# Patient Record
Sex: Male | Born: 1950 | Race: Black or African American | Hispanic: No | Marital: Married | Smoking: Former smoker
Health system: Southern US, Community
[De-identification: ages and names within clinical notes are randomized; demographics above are authoritative.]

## PROBLEM LIST (undated history)

## (undated) DIAGNOSIS — I499 Cardiac arrhythmia, unspecified: Secondary | ICD-10-CM

## (undated) DIAGNOSIS — I739 Peripheral vascular disease, unspecified: Secondary | ICD-10-CM

## (undated) DIAGNOSIS — E1159 Type 2 diabetes mellitus with other circulatory complications: Secondary | ICD-10-CM

## (undated) DIAGNOSIS — E1169 Type 2 diabetes mellitus with other specified complication: Secondary | ICD-10-CM

## (undated) DIAGNOSIS — E119 Type 2 diabetes mellitus without complications: Secondary | ICD-10-CM

## (undated) DIAGNOSIS — D631 Anemia in chronic kidney disease: Secondary | ICD-10-CM

## (undated) DIAGNOSIS — N183 Chronic kidney disease, stage 3 unspecified: Secondary | ICD-10-CM

## (undated) DIAGNOSIS — Z794 Long term (current) use of insulin: Secondary | ICD-10-CM

## (undated) DIAGNOSIS — I509 Heart failure, unspecified: Secondary | ICD-10-CM

## (undated) DIAGNOSIS — E785 Hyperlipidemia, unspecified: Secondary | ICD-10-CM

## (undated) DIAGNOSIS — N189 Chronic kidney disease, unspecified: Secondary | ICD-10-CM

## (undated) DIAGNOSIS — I152 Hypertension secondary to endocrine disorders: Secondary | ICD-10-CM

## (undated) HISTORY — PX: PERIPHERAL ARTERIAL STENT GRAFT: SHX2220

## (undated) HISTORY — DX: Heart failure, unspecified: I50.9

## (undated) HISTORY — DX: Peripheral vascular disease, unspecified: I73.9

## (undated) HISTORY — DX: Cardiac arrhythmia, unspecified: I49.9

## (undated) HISTORY — DX: Hyperlipidemia, unspecified: E78.5

---

## 2010-12-20 DIAGNOSIS — E1165 Type 2 diabetes mellitus with hyperglycemia: Secondary | ICD-10-CM | POA: Insufficient documentation

## 2010-12-20 DIAGNOSIS — I10 Essential (primary) hypertension: Secondary | ICD-10-CM | POA: Diagnosis present

## 2010-12-20 DIAGNOSIS — IMO0002 Reserved for concepts with insufficient information to code with codable children: Secondary | ICD-10-CM | POA: Insufficient documentation

## 2011-05-02 HISTORY — PX: TRANSMETATARSAL AMPUTATION: SHX6197

## 2011-08-03 DIAGNOSIS — I7025 Atherosclerosis of native arteries of other extremities with ulceration: Secondary | ICD-10-CM | POA: Insufficient documentation

## 2012-09-18 DIAGNOSIS — H259 Unspecified age-related cataract: Secondary | ICD-10-CM | POA: Insufficient documentation

## 2012-09-18 DIAGNOSIS — E113299 Type 2 diabetes mellitus with mild nonproliferative diabetic retinopathy without macular edema, unspecified eye: Secondary | ICD-10-CM | POA: Insufficient documentation

## 2013-04-17 DIAGNOSIS — I739 Peripheral vascular disease, unspecified: Secondary | ICD-10-CM | POA: Diagnosis present

## 2013-06-11 DIAGNOSIS — R17 Unspecified jaundice: Secondary | ICD-10-CM | POA: Insufficient documentation

## 2013-06-11 DIAGNOSIS — R531 Weakness: Secondary | ICD-10-CM | POA: Insufficient documentation

## 2014-01-27 DIAGNOSIS — L84 Corns and callosities: Secondary | ICD-10-CM | POA: Insufficient documentation

## 2014-05-08 DIAGNOSIS — I480 Paroxysmal atrial fibrillation: Secondary | ICD-10-CM | POA: Diagnosis present

## 2014-09-15 DIAGNOSIS — R195 Other fecal abnormalities: Secondary | ICD-10-CM | POA: Insufficient documentation

## 2020-10-05 ENCOUNTER — Encounter: Payer: Self-pay | Admitting: Internal Medicine

## 2020-10-05 ENCOUNTER — Inpatient Hospital Stay: Payer: Medicare HMO

## 2020-10-05 ENCOUNTER — Inpatient Hospital Stay
Admission: EM | Admit: 2020-10-05 | Discharge: 2020-10-09 | DRG: 291 | Disposition: A | Discharge status: Home / Self Care | Payer: Medicare HMO | Attending: Internal Medicine | Admitting: Internal Medicine

## 2020-10-05 ENCOUNTER — Emergency Department: Payer: Medicare HMO

## 2020-10-05 DIAGNOSIS — J9601 Acute respiratory failure with hypoxia: Secondary | ICD-10-CM | POA: Diagnosis present

## 2020-10-05 DIAGNOSIS — E669 Obesity, unspecified: Secondary | ICD-10-CM | POA: Diagnosis present

## 2020-10-05 DIAGNOSIS — D509 Iron deficiency anemia, unspecified: Secondary | ICD-10-CM | POA: Diagnosis present

## 2020-10-05 DIAGNOSIS — Z20822 Contact with and (suspected) exposure to covid-19: Secondary | ICD-10-CM | POA: Diagnosis present

## 2020-10-05 DIAGNOSIS — E875 Hyperkalemia: Secondary | ICD-10-CM | POA: Diagnosis present

## 2020-10-05 DIAGNOSIS — N1831 Chronic kidney disease, stage 3a: Secondary | ICD-10-CM | POA: Diagnosis present

## 2020-10-05 DIAGNOSIS — I5033 Acute on chronic diastolic (congestive) heart failure: Secondary | ICD-10-CM | POA: Diagnosis present

## 2020-10-05 DIAGNOSIS — Z8616 Personal history of COVID-19: Secondary | ICD-10-CM

## 2020-10-05 DIAGNOSIS — Z89432 Acquired absence of left foot: Secondary | ICD-10-CM

## 2020-10-05 DIAGNOSIS — E1122 Type 2 diabetes mellitus with diabetic chronic kidney disease: Secondary | ICD-10-CM | POA: Diagnosis present

## 2020-10-05 DIAGNOSIS — I5031 Acute diastolic (congestive) heart failure: Secondary | ICD-10-CM | POA: Diagnosis not present

## 2020-10-05 DIAGNOSIS — N1832 Chronic kidney disease, stage 3b: Secondary | ICD-10-CM | POA: Diagnosis not present

## 2020-10-05 DIAGNOSIS — I340 Nonrheumatic mitral (valve) insufficiency: Secondary | ICD-10-CM | POA: Diagnosis not present

## 2020-10-05 DIAGNOSIS — I152 Hypertension secondary to endocrine disorders: Secondary | ICD-10-CM | POA: Diagnosis present

## 2020-10-05 DIAGNOSIS — I361 Nonrheumatic tricuspid (valve) insufficiency: Secondary | ICD-10-CM | POA: Diagnosis not present

## 2020-10-05 DIAGNOSIS — I480 Paroxysmal atrial fibrillation: Secondary | ICD-10-CM | POA: Diagnosis present

## 2020-10-05 DIAGNOSIS — R778 Other specified abnormalities of plasma proteins: Secondary | ICD-10-CM | POA: Diagnosis present

## 2020-10-05 DIAGNOSIS — Z87891 Personal history of nicotine dependence: Secondary | ICD-10-CM

## 2020-10-05 DIAGNOSIS — I739 Peripheral vascular disease, unspecified: Secondary | ICD-10-CM | POA: Diagnosis present

## 2020-10-05 DIAGNOSIS — E785 Hyperlipidemia, unspecified: Secondary | ICD-10-CM | POA: Diagnosis present

## 2020-10-05 DIAGNOSIS — D631 Anemia in chronic kidney disease: Secondary | ICD-10-CM | POA: Diagnosis present

## 2020-10-05 DIAGNOSIS — I509 Heart failure, unspecified: Secondary | ICD-10-CM

## 2020-10-05 DIAGNOSIS — N183 Chronic kidney disease, stage 3 unspecified: Secondary | ICD-10-CM | POA: Diagnosis present

## 2020-10-05 DIAGNOSIS — Z794 Long term (current) use of insulin: Secondary | ICD-10-CM | POA: Diagnosis not present

## 2020-10-05 DIAGNOSIS — E1151 Type 2 diabetes mellitus with diabetic peripheral angiopathy without gangrene: Secondary | ICD-10-CM | POA: Diagnosis present

## 2020-10-05 DIAGNOSIS — R079 Chest pain, unspecified: Secondary | ICD-10-CM | POA: Diagnosis not present

## 2020-10-05 DIAGNOSIS — R7989 Other specified abnormal findings of blood chemistry: Secondary | ICD-10-CM | POA: Diagnosis present

## 2020-10-05 DIAGNOSIS — E1159 Type 2 diabetes mellitus with other circulatory complications: Secondary | ICD-10-CM | POA: Diagnosis not present

## 2020-10-05 DIAGNOSIS — Z9862 Peripheral vascular angioplasty status: Secondary | ICD-10-CM

## 2020-10-05 DIAGNOSIS — N179 Acute kidney failure, unspecified: Secondary | ICD-10-CM | POA: Diagnosis present

## 2020-10-05 DIAGNOSIS — Z7902 Long term (current) use of antithrombotics/antiplatelets: Secondary | ICD-10-CM

## 2020-10-05 DIAGNOSIS — Z8249 Family history of ischemic heart disease and other diseases of the circulatory system: Secondary | ICD-10-CM

## 2020-10-05 DIAGNOSIS — I248 Other forms of acute ischemic heart disease: Secondary | ICD-10-CM | POA: Diagnosis present

## 2020-10-05 DIAGNOSIS — E1169 Type 2 diabetes mellitus with other specified complication: Secondary | ICD-10-CM | POA: Diagnosis present

## 2020-10-05 DIAGNOSIS — E1165 Type 2 diabetes mellitus with hyperglycemia: Secondary | ICD-10-CM | POA: Diagnosis not present

## 2020-10-05 DIAGNOSIS — I70203 Unspecified atherosclerosis of native arteries of extremities, bilateral legs: Secondary | ICD-10-CM | POA: Diagnosis present

## 2020-10-05 DIAGNOSIS — Z881 Allergy status to other antibiotic agents status: Secondary | ICD-10-CM

## 2020-10-05 DIAGNOSIS — IMO0002 Reserved for concepts with insufficient information to code with codable children: Secondary | ICD-10-CM | POA: Diagnosis present

## 2020-10-05 DIAGNOSIS — Z833 Family history of diabetes mellitus: Secondary | ICD-10-CM

## 2020-10-05 DIAGNOSIS — Z79899 Other long term (current) drug therapy: Secondary | ICD-10-CM

## 2020-10-05 DIAGNOSIS — R0602 Shortness of breath: Secondary | ICD-10-CM | POA: Diagnosis not present

## 2020-10-05 DIAGNOSIS — I130 Hypertensive heart and chronic kidney disease with heart failure and stage 1 through stage 4 chronic kidney disease, or unspecified chronic kidney disease: Secondary | ICD-10-CM | POA: Diagnosis present

## 2020-10-05 DIAGNOSIS — Z7982 Long term (current) use of aspirin: Secondary | ICD-10-CM

## 2020-10-05 DIAGNOSIS — Z6832 Body mass index (BMI) 32.0-32.9, adult: Secondary | ICD-10-CM

## 2020-10-05 DIAGNOSIS — I10 Essential (primary) hypertension: Secondary | ICD-10-CM | POA: Diagnosis present

## 2020-10-05 HISTORY — DX: Type 2 diabetes mellitus without complications: E11.9

## 2020-10-05 HISTORY — DX: Peripheral vascular disease, unspecified: I73.9

## 2020-10-05 HISTORY — DX: Hyperlipidemia, unspecified: E11.69

## 2020-10-05 HISTORY — DX: Type 2 diabetes mellitus with other circulatory complications: E11.59

## 2020-10-05 HISTORY — DX: Hypertension secondary to endocrine disorders: I15.2

## 2020-10-05 HISTORY — DX: Chronic kidney disease, stage 3 unspecified: N18.30

## 2020-10-05 HISTORY — DX: Chronic kidney disease, unspecified: D63.1

## 2020-10-05 HISTORY — DX: Long term (current) use of insulin: Z79.4

## 2020-10-05 HISTORY — DX: Chronic kidney disease, unspecified: N18.9

## 2020-10-05 LAB — CBC WITH DIFFERENTIAL/PLATELET
Abs Immature Granulocytes: 0.05 10*3/uL (ref 0.00–0.07)
Basophils Absolute: 0 10*3/uL (ref 0.0–0.1)
Basophils Relative: 0 %
Eosinophils Absolute: 0 10*3/uL (ref 0.0–0.5)
Eosinophils Relative: 0 %
HCT: 30.3 % — ABNORMAL LOW (ref 39.0–52.0)
Hemoglobin: 8.8 g/dL — ABNORMAL LOW (ref 13.0–17.0)
Immature Granulocytes: 1 %
Lymphocytes Relative: 6 %
Lymphs Abs: 0.6 10*3/uL — ABNORMAL LOW (ref 0.7–4.0)
MCH: 25.4 pg — ABNORMAL LOW (ref 26.0–34.0)
MCHC: 29 g/dL — ABNORMAL LOW (ref 30.0–36.0)
MCV: 87.6 fL (ref 80.0–100.0)
Monocytes Absolute: 0.5 10*3/uL (ref 0.1–1.0)
Monocytes Relative: 6 %
Neutro Abs: 7.5 10*3/uL (ref 1.7–7.7)
Neutrophils Relative %: 87 %
Platelets: 221 10*3/uL (ref 150–400)
RBC: 3.46 MIL/uL — ABNORMAL LOW (ref 4.22–5.81)
RDW: 15.3 % (ref 11.5–15.5)
WBC: 8.7 10*3/uL (ref 4.0–10.5)
nRBC: 0.9 % — ABNORMAL HIGH (ref 0.0–0.2)

## 2020-10-05 LAB — COMPREHENSIVE METABOLIC PANEL
ALT: 68 U/L — ABNORMAL HIGH (ref 0–44)
AST: 45 U/L — ABNORMAL HIGH (ref 15–41)
Albumin: 3.9 g/dL (ref 3.5–5.0)
Alkaline Phosphatase: 115 U/L (ref 38–126)
Anion gap: 10 (ref 5–15)
BUN: 61 mg/dL — ABNORMAL HIGH (ref 8–23)
CO2: 20 mmol/L — ABNORMAL LOW (ref 22–32)
Calcium: 8.8 mg/dL — ABNORMAL LOW (ref 8.9–10.3)
Chloride: 109 mmol/L (ref 98–111)
Creatinine, Ser: 2.5 mg/dL — ABNORMAL HIGH (ref 0.61–1.24)
GFR, Estimated: 27 mL/min — ABNORMAL LOW (ref >60)
Glucose, Bld: 305 mg/dL — ABNORMAL HIGH (ref 70–99)
Potassium: 5.7 mmol/L — ABNORMAL HIGH (ref 3.5–5.1)
Sodium: 139 mmol/L (ref 135–145)
Total Bilirubin: 1.5 mg/dL — ABNORMAL HIGH (ref 0.3–1.2)
Total Protein: 8.2 g/dL — ABNORMAL HIGH (ref 6.5–8.1)

## 2020-10-05 LAB — FIBRIN DERIVATIVES D-DIMER (ARMC ONLY): Fibrin derivatives D-dimer (ARMC): 2889.05 ng/mL (FEU) — ABNORMAL HIGH (ref 0.00–499.00)

## 2020-10-05 LAB — CBG MONITORING, ED: Glucose-Capillary: 248 mg/dL — ABNORMAL HIGH (ref 70–99)

## 2020-10-05 LAB — TROPONIN I (HIGH SENSITIVITY)
Troponin I (High Sensitivity): 466 ng/L (ref <18)
Troponin I (High Sensitivity): 1230 ng/L (ref <18)

## 2020-10-05 LAB — PROTIME-INR
INR: 1.2 (ref 0.8–1.2)
Prothrombin Time: 14.9 seconds (ref 11.4–15.2)

## 2020-10-05 LAB — APTT: aPTT: 34 seconds (ref 24–36)

## 2020-10-05 LAB — BRAIN NATRIURETIC PEPTIDE: B Natriuretic Peptide: 1365.1 pg/mL — ABNORMAL HIGH (ref 0.0–100.0)

## 2020-10-05 MED ORDER — IPRATROPIUM-ALBUTEROL 0.5-2.5 (3) MG/3ML IN SOLN
3.0000 mL | Freq: Once | RESPIRATORY_TRACT | Status: AC
  Administered 2020-10-05: 20:00:00 3 mL via RESPIRATORY_TRACT
  Filled 2020-10-05: qty 3

## 2020-10-05 MED ORDER — AMLODIPINE BESYLATE 10 MG PO TABS
10.0000 mg | ORAL_TABLET | Freq: Every day | ORAL | Status: DC
  Administered 2020-10-06 – 2020-10-09 (×4): 10 mg via ORAL
  Filled 2020-10-05: qty 1
  Filled 2020-10-05: qty 2
  Filled 2020-10-05 (×2): qty 1
  Filled 2020-10-05: qty 2

## 2020-10-05 MED ORDER — FUROSEMIDE 10 MG/ML IJ SOLN
40.0000 mg | Freq: Two times a day (BID) | INTRAMUSCULAR | Status: DC
  Administered 2020-10-06 – 2020-10-08 (×5): 40 mg via INTRAVENOUS
  Filled 2020-10-05 (×5): qty 4

## 2020-10-05 MED ORDER — HEPARIN BOLUS VIA INFUSION
4000.0000 [IU] | Freq: Once | INTRAVENOUS | Status: AC
  Administered 2020-10-05: 22:00:00 4000 [IU] via INTRAVENOUS
  Filled 2020-10-05: qty 4000

## 2020-10-05 MED ORDER — METHYLPREDNISOLONE SODIUM SUCC 125 MG IJ SOLR
125.0000 mg | Freq: Once | INTRAMUSCULAR | Status: AC
  Administered 2020-10-05: 20:00:00 125 mg via INTRAVENOUS
  Filled 2020-10-05: qty 2

## 2020-10-05 MED ORDER — PATIROMER SORBITEX CALCIUM 8.4 G PO PACK
16.8000 g | PACK | Freq: Every day | ORAL | Status: DC
  Administered 2020-10-05 – 2020-10-08 (×4): 16.8 g via ORAL
  Filled 2020-10-05 (×5): qty 2

## 2020-10-05 MED ORDER — INSULIN ASPART 100 UNIT/ML ~~LOC~~ SOLN
0.0000 [IU] | Freq: Every day | SUBCUTANEOUS | Status: DC
  Administered 2020-10-05 – 2020-10-08 (×2): 2 [IU] via SUBCUTANEOUS
  Filled 2020-10-05 (×2): qty 1

## 2020-10-05 MED ORDER — INSULIN ASPART 100 UNIT/ML ~~LOC~~ SOLN
6.0000 [IU] | Freq: Once | SUBCUTANEOUS | Status: AC
  Administered 2020-10-05: 22:00:00 6 [IU] via INTRAVENOUS
  Filled 2020-10-05: qty 1

## 2020-10-05 MED ORDER — HYDRALAZINE HCL 50 MG PO TABS
50.0000 mg | ORAL_TABLET | Freq: Two times a day (BID) | ORAL | Status: DC
  Administered 2020-10-05 – 2020-10-08 (×7): 50 mg via ORAL
  Filled 2020-10-05 (×8): qty 1

## 2020-10-05 MED ORDER — INSULIN ASPART 100 UNIT/ML ~~LOC~~ SOLN
0.0000 [IU] | Freq: Three times a day (TID) | SUBCUTANEOUS | Status: DC
  Administered 2020-10-06: 08:00:00 8 [IU] via SUBCUTANEOUS
  Administered 2020-10-06: 12:00:00 11 [IU] via SUBCUTANEOUS
  Administered 2020-10-06: 18:00:00 2 [IU] via SUBCUTANEOUS
  Administered 2020-10-07 (×2): 3 [IU] via SUBCUTANEOUS
  Administered 2020-10-07: 13:00:00 5 [IU] via SUBCUTANEOUS
  Administered 2020-10-08: 09:00:00 3 [IU] via SUBCUTANEOUS
  Administered 2020-10-08 (×2): 5 [IU] via SUBCUTANEOUS
  Administered 2020-10-09: 14:00:00 8 [IU] via SUBCUTANEOUS
  Filled 2020-10-05 (×9): qty 1

## 2020-10-05 MED ORDER — ACETAMINOPHEN 650 MG RE SUPP: 650.0000 mg | Freq: Four times a day (QID) | RECTAL | Status: DC | PRN

## 2020-10-05 MED ORDER — CARVEDILOL 25 MG PO TABS
25.0000 mg | ORAL_TABLET | Freq: Two times a day (BID) | ORAL | Status: DC
  Administered 2020-10-06 – 2020-10-09 (×7): 25 mg via ORAL
  Filled 2020-10-05 (×9): qty 1

## 2020-10-05 MED ORDER — ASPIRIN 81 MG PO CHEW
324.0000 mg | CHEWABLE_TABLET | Freq: Once | ORAL | Status: AC
  Administered 2020-10-05: 23:00:00 324 mg via ORAL
  Filled 2020-10-05: qty 4

## 2020-10-05 MED ORDER — FUROSEMIDE 10 MG/ML IJ SOLN
40.0000 mg | Freq: Once | INTRAMUSCULAR | Status: AC
  Administered 2020-10-05: 21:00:00 40 mg via INTRAVENOUS
  Filled 2020-10-05: qty 4

## 2020-10-05 MED ORDER — HEPARIN (PORCINE) 25000 UT/250ML-% IV SOLN
1300.0000 [IU]/h | INTRAVENOUS | Status: DC
  Administered 2020-10-05: 22:00:00 1300 [IU]/h via INTRAVENOUS
  Filled 2020-10-05: qty 250

## 2020-10-05 MED ORDER — ONDANSETRON HCL 4 MG/2ML IJ SOLN: 4.0000 mg | Freq: Four times a day (QID) | INTRAMUSCULAR | Status: DC | PRN

## 2020-10-05 MED ORDER — ATORVASTATIN CALCIUM 20 MG PO TABS
40.0000 mg | ORAL_TABLET | Freq: Every day | ORAL | Status: DC
  Administered 2020-10-06 – 2020-10-09 (×4): 40 mg via ORAL
  Filled 2020-10-05 (×5): qty 2

## 2020-10-05 MED ORDER — ONDANSETRON HCL 4 MG PO TABS: 4.0000 mg | ORAL_TABLET | Freq: Four times a day (QID) | ORAL | Status: DC | PRN

## 2020-10-05 MED ORDER — ACETAMINOPHEN 325 MG PO TABS: 650.0000 mg | ORAL_TABLET | Freq: Four times a day (QID) | ORAL | Status: DC | PRN

## 2020-10-05 NOTE — H&P (Signed)
History and Physical    Anthony Fox DXI:[id number] DOB: 1951-05-07 DOA: 10/05/2020  PCP: Patient, No Pcp Per  Patient coming from: Home via EMS  I have personally briefly reviewed patient's old medical records in [place]  Chief Complaint: Shortness of breath  HPI: Anthony Fox is a 69 y.o. male with medical history significant for insulin-dependent type 2 diabetes, PAD (s/p right femoropopliteal angioplasty with drug-coated balloon to 12/06/2019), PAF on aspirin/Plavix, CKD stage III, hypertension, hyperlipidemia, recurrent hyperkalemia, anemia of chronic kidney disease who presents to the ED for evaluation of shortness of breath.  Majority of care is through [facility] all prior records in [place].  Patient states he has been having 3 or 4 months of progressive shortness of breath.  He has been having notable orthopnea, dyspnea on exertion, paroxysmal nocturnal dyspnea, and subjective weight gain.  Symptoms significantly worsened over the last few days and eventually called EMS earlier today.  Per ED documentation he was found to be hypoxic with SPO2 in the low 80s on their arrival.  He was brought to the ED for further evaluation.  Patient denies any recent chest pain.  He has occasional palpitations.  He has a cough productive of white sputum.  He reports he had good urine output without dysuria.  He has not seen any obvious bleeding including epistaxis, hemoptysis, hematemesis, hematuria, hematochezia, or melena.  He denies any nausea, vomiting, subjective fevers, chills, diaphoresis, abdominal pain.  He is a former smoker and says he quit many years ago.  He denies any alcohol or illicit drug use.  He reports a history of allergy to minocycline which has caused a rash in the past.  He states he has been taking his medications regularly.  He denies any known personal history of cardiac or pulmonary issues.  ED Course:  Initial vitals showed BP 153/135, pulse 88, RR 25, temp  98.8 F, SPO2 99% on aerosol mask.  Patient was placed on BiPAP with reported symptomatic improvement.  Labs are notable for sodium 139, potassium 5.7, bicarb 20, BUN 61, creatinine 2.50 (baseline ~1.6 in Care Everywhere), serum glucose 305, AST 45, ALT 68, alk phos 115, total bilirubin 1.5, WBC 8.7, hemoglobin 8.8, platelets 221,000, high-sensitivity troponin I 466, BNP 1365.1, fibrin derivatives D-dimer 2889.05.  SARS-CoV-2 PCR panel was collected and pending.  Portable chest x-ray shows mild cardiomegaly with central pulmonary vascular congestion and bilateral pulmonary edema.  Patient was given IV Lasix 40 mg once, IV Solu-Medrol 125 mg, IV NovoLog 6 units, Veltassa, DuoNeb treatment, and started on IV heparin.  Lower extremity Dopplers were ordered and pending.  The hospitalist service was consulted to admit for further evaluation and management.  Review of Systems: All systems reviewed and are negative except as documented in history of present illness above.   Past Medical History:  Diagnosis Date  . Anemia due to chronic kidney disease   . CKD (chronic kidney disease), stage III ([place])   . Hyperlipidemia associated with type 2 diabetes mellitus ([place])   . Hypertension associated with diabetes ([place])   . Insulin dependent type 2 diabetes mellitus ([place])   . PAD (peripheral artery disease) ([place])     Past Surgical History:  Procedure Laterality Date  . 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    Social History:  reports that he has quit smoking. He has never used smokeless tobacco. He reports that he does not drink alcohol and does not use drugs.  Allergies  Allergen Reactions  . Minocycline Rash  Family History  Problem Relation Age of Onset  . Diabetes Mother   . Hypertension Mother   . Diabetes Brother      Prior to Admission medications   Not on File    Physical Exam: Vitals:   10/05/20 2000 10/05/20 2030 10/05/20 2100 10/05/20 2130  BP: (!) 164/66 (!)  167/73 (!) 177/74 (!) 166/82  Pulse: 70 88 87 (!) 57  Resp: (!) 22 (!) 21 (!) 24 (!) 23  Temp:      TempSrc:      SpO2: 96% 97% 100% 100%  Weight:      Height:       Constitutional: Obese man resting supine in bed while wearing BiPAP, NAD, calm, comfortable Eyes: PERRL, lids and conjunctivae normal ENMT: Mucous membranes are moist. Posterior pharynx clear of any exudate or lesions. Neck: normal, supple, no masses. Respiratory: Bibasilar inspiratory crackles.  Normal respiratory effort while on BiPAP. No accessory muscle use.  Cardiovascular: Regular rate and rhythm, no murmurs / rubs / gallops.  Trace bilateral lower extremity edema. 2+ pedal pulses. Abdomen: no tenderness, no masses palpated. No hepatosplenomegaly. Bowel sounds positive.  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  No clubbing / cyanosis. Good ROM, no contractures. Normal muscle tone.  Skin: Well-healed RLE vascular bypass surgical scar.  No rashes, lesions, ulcers. No induration Neurologic: CN 2-12 grossly intact. Sensation intact, Strength 5/5 in all 4.  Psychiatric: Normal judgment and insight. Alert and oriented x 3. Normal mood.   Labs on Admission: I have personally reviewed following labs and imaging studies  CBC: Recent Labs  Lab 10/05/20 1928  WBC 8.7  NEUTROABS 7.5  HGB 8.8*  HCT 30.3*  MCV 87.6  PLT 540   Basic Metabolic Panel: Recent Labs  Lab 10/05/20 1928  NA 139  K 5.7*  CL 109  CO2 20*  GLUCOSE 305*  BUN 61*  CREATININE 2.50*  CALCIUM 8.8*   GFR: Estimated Creatinine Clearance: 35.2 mL/min (A) (by C-G formula based on SCr of 2.5 mg/dL (H)). Liver Function Tests: Recent Labs  Lab 10/05/20 1928  AST 45*  ALT 68*  ALKPHOS 115  BILITOT 1.5*  PROT 8.2*  ALBUMIN 3.9   No results for input(s): LIPASE, AMYLASE in the last 168 hours. No results for input(s): AMMONIA in the last 168 hours. Coagulation Profile: No results for  input(s): INR, PROTIME in the last 168 hours. Cardiac Enzymes: No results for input(s): CKTOTAL, CKMB, CKMBINDEX, TROPONINI in the last 168 hours. BNP (last 3 results) No results for input(s): PROBNP in the last 8760 hours. HbA1C: No results for input(s): HGBA1C in the last 72 hours. CBG: No results for input(s): GLUCAP in the last 168 hours. Lipid Profile: No results for input(s): CHOL, HDL, LDLCALC, TRIG, CHOLHDL, LDLDIRECT in the last 72 hours. Thyroid Function Tests: No results for input(s): TSH, T4TOTAL, FREET4, T3FREE, THYROIDAB in the last 72 hours. Anemia Panel: No results for input(s): VITAMINB12, FOLATE, FERRITIN, TIBC, IRON, RETICCTPCT in the last 72 hours. Urine analysis: No results found for: COLORURINE, APPEARANCEUR, [place], [place], GLUCOSEU, HGBUR, BILIRUBINUR, KETONESUR, PROTEINUR, UROBILINOGEN, NITRITE, LEUKOCYTESUR  Radiological Exams on Admission: DG Chest Portable 1 View  Result Date: 10/05/2020 CLINICAL DATA:  Shortness of breath. EXAM: PORTABLE CHEST 1 VIEW COMPARISON:  None. FINDINGS: Mild cardiomegaly is noted. No pneumothorax or pleural effusion is noted. Mild central pulmonary vascular congestion is noted. Mild interstitial densities are noted in the perihilar and basilar regions concerning for pulmonary  edema. Bony thorax is unremarkable. IMPRESSION: Mild cardiomegaly with central pulmonary vascular congestion and probable mild bilateral pulmonary edema. Electronically Signed   By: Marijo Conception M.D.   On: 10/05/2020 19:49    EKG: Personally reviewed. Sinus rhythm, T wave inversions in leads III and aVF, RAD, no prior for comparison.  Assessment/Plan Principal Problem:   Acute respiratory failure with hypoxia (HCC) Active Problems:   DM (diabetes mellitus) type II uncontrolled, periph vascular disorder (HCC)   PAD (peripheral artery disease) (HCC)   Hypertension associated with diabetes ([place])   Anemia of chronic kidney failure, stage 3 (moderate)  (HCC)   Paroxysmal atrial fibrillation (HCC)   CKD (chronic kidney disease) stage 3, GFR 30-59 ml/min (HCC)   Hyperkalemia   Elevated troponin   Acute CHF (congestive heart failure) (HCC)   Hyperlipidemia associated with type 2 diabetes mellitus ([place])   Acute renal failure superimposed on stage 3 chronic kidney disease ([place])  Anthony Fox is a 69 y.o. male with medical history significant for insulin-dependent type 2 diabetes, PAD (s/p right femoropopliteal angioplasty with drug-coated balloon to 12/06/2019), PAF on aspirin/Plavix, CKD stage III, hypertension, hyperlipidemia, recurrent hyperkalemia, anemia of chronic kidney disease who is admitted with acute respiratory failure with hypoxia.  Acute respiratory failure with hypoxia due to acute CHF Elevated troponin: Patient with progressive orthopnea, PND, DOE, subjective weight gain for several months.  BNP is elevated and patient hypoxic on arrival, requiring BiPAP on admission.  Clinical picture suggestive of acute decompensated CHF, no known prior history of cardiac issues.  Troponin elevation suspect due to demand ischemia.  He denies any chest pain. -Continue IV Lasix 40 mg twice daily -Obtain echocardiogram -Strict I/O's and daily weights -Continue Coreg 25 mg twice daily -Continue BiPAP as needed and wean to nasal cannula as able -Cycle cardiac enzymes -Continue IV heparin for now  AKI on CKD stage III: Baseline creatinine in Care Everywhere appears to be around 1.6.  Creatinine on admission is 2.5, suspect secondary to CHF plus medication effect. -Continue diuresis as above -Hold home lisinopril and chlorthalidone -Avoid NSAIDs  Hyperkalemia: K 5.7 on admission, appears to be recurrent issue per care everywhere records. -S/p Veltassa and NovoLog in the ED -Continue IV Lasix diuresis -Hold home lisinopril  Elevated D-dimer: PE still on differential but cannot obtain CTA chest due to renal dysfunction.  Lower extremity  Dopplers ordered and pending.   -Continue IV heparin -Follow lower extremity Dopplers  PAD: S/p right femoropopliteal angioplasty with drug-coated balloon 12/06/2019, left transmetatarsal and right first/second toe amputations.  Follows with [facility] vascular surgery, Dr. Ronita Hipps. -Hold home aspirin/Plavix for now while on IV heparin -Continue statin  Paroxysmal atrial fibrillation: History of PAF per Care Everywhere.  In sinus rhythm on admission.  CHA2DS2-VASc score is at least 4.  He is on aspirin/Plavix as an outpatient. -Continue IV heparin -Holding aspirin/Plavix for now -Continue Coreg 25 mg twice daily  Hyperglycemia in insulin-dependent type 2 diabetes: Place on moderate SSI plus HS coverage.  Check A1c.  Hypertension: Continue Coreg, amlodipine, and IV Lasix as above.  Holding lisinopril and chlorthalidone.  Hyperlipidemia: Continue atorvastatin.  Anemia of chronic kidney disease and iron deficiency: Hemoglobin 8.8 on admission, decreased from previous value of 12.0 on 07/16/2020.  Patient denies any obvious bleeding.  Check anemia panel and continue to monitor.  DVT prophylaxis: IV heparin Code Status: Full code, confirmed with patient Family Communication: Discussed with patient's spouse at bedside Disposition Plan: From home and likely discharge  to home pending adequate diuresis, symptomatic improvement, and further cardiac work-up Consults called: None Admission status:  Status is: Inpatient  Remains inpatient appropriate because:Ongoing diagnostic testing needed not appropriate for outpatient work up, IV treatments appropriate due to intensity of illness or inability to take PO and Inpatient level of care appropriate due to severity of illness   Dispo: The patient is from: Home              Anticipated d/c is to: Home              Anticipated d/c date is: 3 days              Patient currently is not medically stable to d/c.  Zada Finders MD [facility]  If 7PM-7AM, please contact night-coverage [email or web address]  10/05/2020, 10:15 PM

## 2020-10-05 NOTE — ED Notes (Addendum)
Respiratory at bedside. Bipap applied. Pt placed in gown. Cardiac monitor applied. EKG completed.

## 2020-10-05 NOTE — ED Notes (Signed)
Lab called with notification of critical troponin of 466. MD made aware. See new orders.

## 2020-10-05 NOTE — Consult Note (Signed)
ANTICOAGULATION CONSULT NOTE  Pharmacy Consult for Heparin Indication: chest pain/ACS  No Known Allergies  Patient Measurements: Height: 6\' 1"  (185.4 cm) Weight: 100 kg (220 lb 7.4 oz) IBW/kg (Calculated) : 79.9 Heparin Dosing Weight: 99.9 kg  Vital Signs: Temp: 98.8 F (37.1 C) (12/13 1848) Temp Source: Oral (12/13 1848) BP: 177/74 (12/13 2100) Pulse Rate: 87 (12/13 2100)  Labs: Recent Labs    10/05/20 1928  HGB 8.8*  HCT 30.3*  PLT 221  CREATININE 2.50*  TROPONINIHS 466*    Estimated Creatinine Clearance: 35.2 mL/min (A) (by C-G formula based on SCr of 2.5 mg/dL (H)).   Medical History: No past medical history on file.  Medications:  (Not in a hospital admission)  Scheduled:  Infusions:  PRN:  Anti-infectives (From admission, onward)   None      Assessment: Pharmacy consulted for heparin for ACS. Trop elevated. No note of DOAC PTA.   Goal of Therapy:  Heparin level 0.3-0.7 units/ml Monitor platelets by anticoagulation protocol: Yes   Plan:  Give 4000 units bolus x 1 Start heparin infusion at 1300 units/hr Check anti-Xa level in 6 hours and daily while on heparin Continue to monitor H&H and platelets  10/07/20, PharmD, BCPS 10/05/2020,9:09 PM

## 2020-10-05 NOTE — ED Provider Notes (Signed)
[facility] Emergency Department Provider Note    Event Date/Time   First MD Initiated Contact with Patient 10/05/20 1851     (approximate)  I have reviewed the triage vital signs and the nursing notes.   HISTORY  Chief Complaint Shortness of Breath (Per ems 85% upon arrival , SOB, no COPD, covid x 2 months ago, x 2 dounebs given by ems )    HPI Anthony Fox is a 69 y.o. male with history of Covid illness back in February of this past year presents to the ER for worsening shortness of breath.  Has been feeling tight in his chest and feeling a wheeze when he breathes for the past several days.  Has noted worsening swelling in his legs.  Is not any blood thinners.  Denies any history of COPD or congestive heart failure.  Does not wear home oxygen.  EMS found the patient to be hypoxic in the low 80s.  Given his wheezes was given's duo nebs.    No past medical history on file. No family history on file.  There are no problems to display for this patient.     Prior to Admission medications   Not on File    Allergies Patient has no known allergies.    Social History    Review of Systems Patient denies headaches, rhinorrhea, blurry vision, numbness, shortness of breath, chest pain, edema, cough, abdominal pain, nausea, vomiting, diarrhea, dysuria, fevers, rashes or hallucinations unless otherwise stated above in HPI. ____________________________________________   PHYSICAL EXAM:  VITAL SIGNS: Vitals:   10/05/20 2030 10/05/20 2100  BP: (!) 167/73 (!) 177/74  Pulse: 88 87  Resp: (!) 21   Temp:    SpO2: 97% 100%    Constitutional: Alert and oriented. Ill appearing in acute respiratory distress Eyes: Conjunctivae are normal.  Head: Atraumatic. Nose: No congestion/rhinnorhea. Mouth/Throat: Mucous membranes are moist.   Neck: No stridor. Painless ROM.  Cardiovascular: Normal rate, regular rhythm. Grossly normal heart sounds.  Good  peripheral circulation. Respiratory: marked tachypnea with diminished bs throughout, faint expiratory wheeze noted Gastrointestinal: Soft and nontender. No distention. No abdominal bruits. No CVA tenderness. Genitourinary:  Musculoskeletal: No lower extremity tenderness, 2+ edema.  No joint effusions. Neurologic:  Normal speech and language. No gross focal neurologic deficits are appreciated. No facial droop Skin:  Skin is warm, dry and intact. No rash noted. Psychiatric: Mood and affect are normal. Speech and behavior are normal.  ____________________________________________   LABS (all labs ordered are listed, but only abnormal results are displayed)  Results for orders placed or performed during the hospital encounter of 10/05/20 (from the past 24 hour(s))  CBC with Differential     Status: Abnormal   Collection Time: 10/05/20  7:28 PM  Result Value Ref Range   WBC 8.7 4.0 - 10.5 K/uL   RBC 3.46 (L) 4.22 - 5.81 MIL/uL   Hemoglobin 8.8 (L) 13.0 - 17.0 g/dL   HCT 88.9 (L) 16.9 - 45.0 %   MCV 87.6 80.0 - 100.0 fL   MCH 25.4 (L) 26.0 - 34.0 pg   MCHC 29.0 (L) 30.0 - 36.0 g/dL   RDW 38.8 82.8 - 00.3 %   Platelets 221 150 - 400 K/uL   nRBC 0.9 (H) 0.0 - 0.2 %   Neutrophils Relative % 87 %   Neutro Abs 7.5 1.7 - 7.7 K/uL   Lymphocytes Relative 6 %   Lymphs Abs 0.6 (L) 0.7 - 4.0 K/uL   Monocytes  Relative 6 %   Monocytes Absolute 0.5 0.1 - 1.0 K/uL   Eosinophils Relative 0 %   Eosinophils Absolute 0.0 0.0 - 0.5 K/uL   Basophils Relative 0 %   Basophils Absolute 0.0 0.0 - 0.1 K/uL   Immature Granulocytes 1 %   Abs Immature Granulocytes 0.05 0.00 - 0.07 K/uL  Comprehensive metabolic panel     Status: Abnormal   Collection Time: 10/05/20  7:28 PM  Result Value Ref Range   Sodium 139 135 - 145 mmol/L   Potassium 5.7 (H) 3.5 - 5.1 mmol/L   Chloride 109 98 - 111 mmol/L   CO2 20 (L) 22 - 32 mmol/L   Glucose, Bld 305 (H) 70 - 99 mg/dL   BUN 61 (H) 8 - 23 mg/dL   Creatinine, Ser  7.16 (H) 0.61 - 1.24 mg/dL   Calcium 8.8 (L) 8.9 - 10.3 mg/dL   Total Protein 8.2 (H) 6.5 - 8.1 g/dL   Albumin 3.9 3.5 - 5.0 g/dL   AST 45 (H) 15 - 41 U/L   ALT 68 (H) 0 - 44 U/L   Alkaline Phosphatase 115 38 - 126 U/L   Total Bilirubin 1.5 (H) 0.3 - 1.2 mg/dL   GFR, Estimated 27 (L) >60 mL/min   Anion gap 10 5 - 15  Troponin I (High Sensitivity)     Status: Abnormal   Collection Time: 10/05/20  7:28 PM  Result Value Ref Range   Troponin I (High Sensitivity) 466 (HH) <18 ng/L  Brain natriuretic peptide     Status: Abnormal   Collection Time: 10/05/20  7:28 PM  Result Value Ref Range   B Natriuretic Peptide 1,365.1 (H) 0.0 - 100.0 pg/mL  Fibrin derivatives D-Dimer ([facility] only)     Status: Abnormal   Collection Time: 10/05/20  7:28 PM  Result Value Ref Range   Fibrin derivatives D-dimer ([facility]) 2,889.05 (H) 0.00 - 499.00 ng/mL (FEU)   ____________________________________________  EKG My review and personal interpretation at Time: 19:31   Indication: sob  Rate: 75  Rhythm: sinus Axis: righ5t Other: nonspecific st abn, no stemi, inferolat st and tw abn ____________________________________________  RADIOLOGY  I personally reviewed all radiographic images ordered to evaluate for the above acute complaints and reviewed radiology reports and findings.  These findings were personally discussed with the patient.  Please see medical record for radiology report.  ____________________________________________   PROCEDURES  Procedure(s) performed:  .Critical Care Performed by: Willy Eddy, MD Authorized by: Willy Eddy, MD   Critical care provider statement:    Critical care time (minutes):  35   Critical care time was exclusive of:  Separately billable procedures and treating other patients   Critical care was necessary to treat or prevent imminent or life-threatening deterioration of the following conditions:  Respiratory failure   Critical care was time spent personally  by me on the following activities:  Development of treatment plan with patient or surrogate, discussions with consultants, evaluation of patient's response to treatment, examination of patient, obtaining history from patient or surrogate, ordering and performing treatments and interventions, ordering and review of laboratory studies, ordering and review of radiographic studies, pulse oximetry, re-evaluation of patient's condition and review of old charts      Critical Care performed: yes ____________________________________________   INITIAL IMPRESSION / ASSESSMENT AND PLAN / ED COURSE  Pertinent labs & imaging results that were available during my care of the patient were reviewed by me and considered in my medical decision making (see  chart for details).   DDX: Asthma, copd, CHF, pna, ptx, malignancy, Pe, anemia   Rose Hippler is a 69 y.o. who presents to the ED with respiratory distress as described above.  Patient placed on BiPAP upon arrival.  No fevers.  Given nebs given his wheezing but no history of bronchitis or COPD.  No fever cough to suggest infectious process blood will be sent for the but differential.  Clinical Course as of 10/05/20 2105  [facility] Oct 05, 2020  2001 Patient feels improved on BiPAP.  X-ray appears consistent with CHF. [PR]  2058 Noted troponin elevation but also with significant elevated D-dimer will order lower extremity duplex.  I am going to heparinize him.  Renal function would not permit safe contrast study.  He clinically appears much better.  He is denying any chest pain right now.  Will give aspirin for elevated troponin though this may simply be secondary to CHF.  His clinical picture has significantly improved.  On further questioning it sounds like the symptoms have been steadily worsening over the past several weeks [PR]    Clinical Course User Index [PR] Willy Eddy, MD    The patient was evaluated in Emergency Department today for the  symptoms described in the history of present illness. He/she was evaluated in the context of the global COVID-19 pandemic, which necessitated consideration that the patient might be at risk for infection with the SARS-CoV-2 virus that causes COVID-19. Institutional protocols and algorithms that pertain to the evaluation of patients at risk for COVID-19 are in a state of rapid change based on information released by regulatory bodies including the CDC and federal and state organizations. These policies and algorithms were followed during the patient's care in the ED.  As part of my medical decision making, I reviewed the following data within the electronic MEDICAL RECORD NUMBER Nursing notes reviewed and incorporated, Labs reviewed, notes from prior ED visits and [place] Controlled Substance Database   ____________________________________________   FINAL CLINICAL IMPRESSION(S) / ED DIAGNOSES  Final diagnoses:  Acute respiratory failure with hypoxia (HCC)  Acute congestive heart failure, unspecified heart failure type (HCC)      NEW MEDICATIONS STARTED DURING THIS VISIT:  New Prescriptions   No medications on file     Note:  This document was prepared using Dragon voice recognition software and may include unintentional dictation errors.    Willy Eddy, MD 10/05/20 2105

## 2020-10-06 ENCOUNTER — Inpatient Hospital Stay (HOSPITAL_COMMUNITY)
Admit: 2020-10-06 | Discharge: 2020-10-06 | Disposition: A | Discharge status: Home / Self Care | Payer: Medicare HMO | Attending: Internal Medicine | Admitting: Internal Medicine

## 2020-10-06 DIAGNOSIS — N179 Acute kidney failure, unspecified: Secondary | ICD-10-CM

## 2020-10-06 DIAGNOSIS — R0602 Shortness of breath: Secondary | ICD-10-CM

## 2020-10-06 DIAGNOSIS — I340 Nonrheumatic mitral (valve) insufficiency: Secondary | ICD-10-CM

## 2020-10-06 DIAGNOSIS — I361 Nonrheumatic tricuspid (valve) insufficiency: Secondary | ICD-10-CM

## 2020-10-06 LAB — CBC
HCT: 28.7 % — ABNORMAL LOW (ref 39.0–52.0)
Hemoglobin: 8.9 g/dL — ABNORMAL LOW (ref 13.0–17.0)
MCH: 26.4 pg (ref 26.0–34.0)
MCHC: 31 g/dL (ref 30.0–36.0)
MCV: 85.2 fL (ref 80.0–100.0)
Platelets: 196 10*3/uL (ref 150–400)
RBC: 3.37 MIL/uL — ABNORMAL LOW (ref 4.22–5.81)
RDW: 15.2 % (ref 11.5–15.5)
WBC: 8.8 10*3/uL (ref 4.0–10.5)
nRBC: 0.9 % — ABNORMAL HIGH (ref 0.0–0.2)

## 2020-10-06 LAB — COMPREHENSIVE METABOLIC PANEL
ALT: 62 U/L — ABNORMAL HIGH (ref 0–44)
AST: 36 U/L (ref 15–41)
Albumin: 3.6 g/dL (ref 3.5–5.0)
Alkaline Phosphatase: 107 U/L (ref 38–126)
Anion gap: 10 (ref 5–15)
BUN: 61 mg/dL — ABNORMAL HIGH (ref 8–23)
CO2: 21 mmol/L — ABNORMAL LOW (ref 22–32)
Calcium: 8.9 mg/dL (ref 8.9–10.3)
Chloride: 110 mmol/L (ref 98–111)
Creatinine, Ser: 2.17 mg/dL — ABNORMAL HIGH (ref 0.61–1.24)
GFR, Estimated: 32 mL/min — ABNORMAL LOW (ref >60)
Glucose, Bld: 281 mg/dL — ABNORMAL HIGH (ref 70–99)
Potassium: 5.5 mmol/L — ABNORMAL HIGH (ref 3.5–5.1)
Sodium: 141 mmol/L (ref 135–145)
Total Bilirubin: 1.3 mg/dL — ABNORMAL HIGH (ref 0.3–1.2)
Total Protein: 7.6 g/dL (ref 6.5–8.1)

## 2020-10-06 LAB — IRON AND TIBC
Iron: 21 ug/dL — ABNORMAL LOW (ref 45–182)
Saturation Ratios: 6 % — ABNORMAL LOW (ref 17.9–39.5)
TIBC: 364 ug/dL (ref 250–450)
UIBC: 343 ug/dL

## 2020-10-06 LAB — LIPID PANEL
Cholesterol: 101 mg/dL (ref 0–200)
HDL: 28 mg/dL — ABNORMAL LOW
LDL Cholesterol: 61 mg/dL (ref 0–99)
Total CHOL/HDL Ratio: 3.6 ratio
Triglycerides: 62 mg/dL
VLDL: 12 mg/dL (ref 0–40)

## 2020-10-06 LAB — RESP PANEL BY RT-PCR (FLU A&B, COVID) ARPGX2
Influenza A by PCR: NEGATIVE
Influenza B by PCR: NEGATIVE
SARS Coronavirus 2 by RT PCR: NEGATIVE

## 2020-10-06 LAB — TROPONIN I (HIGH SENSITIVITY): Troponin I (High Sensitivity): 903 ng/L (ref <18)

## 2020-10-06 LAB — HIV ANTIBODY (ROUTINE TESTING W REFLEX): HIV Screen 4th Generation wRfx: NONREACTIVE

## 2020-10-06 LAB — VITAMIN B12: Vitamin B-12: 126 pg/mL — ABNORMAL LOW (ref 180–914)

## 2020-10-06 LAB — HEMOGLOBIN A1C
Hgb A1c MFr Bld: 8.1 % — ABNORMAL HIGH (ref 4.8–5.6)
Mean Plasma Glucose: 185.77 mg/dL

## 2020-10-06 LAB — HEPARIN LEVEL (UNFRACTIONATED)
Heparin Unfractionated: 0.28 IU/mL — ABNORMAL LOW (ref 0.30–0.70)
Heparin Unfractionated: 0.29 IU/mL — ABNORMAL LOW (ref 0.30–0.70)
Heparin Unfractionated: 0.52 IU/mL (ref 0.30–0.70)

## 2020-10-06 LAB — FOLATE: Folate: 15.9 ng/mL (ref >5.9)

## 2020-10-06 LAB — MAGNESIUM: Magnesium: 2.1 mg/dL (ref 1.7–2.4)

## 2020-10-06 LAB — CBG MONITORING, ED
Glucose-Capillary: 267 mg/dL — ABNORMAL HIGH (ref 70–99)
Glucose-Capillary: 300 mg/dL — ABNORMAL HIGH (ref 70–99)
Glucose-Capillary: 318 mg/dL — ABNORMAL HIGH (ref 70–99)
Glucose-Capillary: 135 mg/dL — ABNORMAL HIGH (ref 70–99)
Glucose-Capillary: 194 mg/dL — ABNORMAL HIGH (ref 70–99)

## 2020-10-06 LAB — TSH: TSH: 0.451 u[IU]/mL (ref 0.350–4.500)

## 2020-10-06 LAB — FERRITIN: Ferritin: 16 ng/mL — ABNORMAL LOW (ref 24–336)

## 2020-10-06 MED ORDER — INSULIN DETEMIR 100 UNIT/ML ~~LOC~~ SOLN
20.0000 [IU] | Freq: Every day | SUBCUTANEOUS | Status: DC
  Administered 2020-10-06 – 2020-10-09 (×4): 20 [IU] via SUBCUTANEOUS
  Filled 2020-10-06 (×5): qty 0.2

## 2020-10-06 MED ORDER — HEPARIN (PORCINE) 25000 UT/250ML-% IV SOLN
1700.0000 [IU]/h | INTRAVENOUS | Status: DC
  Administered 2020-10-06: 12:00:00 1500 [IU]/h via INTRAVENOUS
  Administered 2020-10-07 – 2020-10-09 (×4): 1700 [IU]/h via INTRAVENOUS
  Filled 2020-10-06 (×5): qty 250

## 2020-10-06 MED ORDER — HEPARIN BOLUS VIA INFUSION
1500.0000 [IU] | Freq: Once | INTRAVENOUS | Status: AC
  Administered 2020-10-06: 14:00:00 1500 [IU] via INTRAVENOUS
  Filled 2020-10-06: qty 1500

## 2020-10-06 MED ORDER — ASPIRIN EC 81 MG PO TBEC
81.0000 mg | DELAYED_RELEASE_TABLET | Freq: Every day | ORAL | Status: DC
  Administered 2020-10-07 – 2020-10-09 (×3): 81 mg via ORAL
  Filled 2020-10-06 (×3): qty 1

## 2020-10-06 NOTE — Progress Notes (Signed)
Inpatient Diabetes Program Recommendations  AACE/ADA: New Consensus Statement on Inpatient Glycemic Control (2015)  Target Ranges:  Prepandial:   less than 140 mg/dL      Peak postprandial:   less than 180 mg/dL (1-2 hours)      Critically ill patients:  140 - 180 mg/dL   Lab Results  Component Value Date   GLUCAP 267 (H) 10/06/2020    Review of Glycemic Control Results for GALE, HULSE (MRN [id number]) as of 10/06/2020 09:46  Ref. Range 10/05/2020 23:40 10/06/2020 07:47  Glucose-Capillary Latest Ref Range: 70 - 99 mg/dL 664 (H) 403 (H)   Diabetes history: Type 2 DM Outpatient Diabetes medications: NPH 28 units BID Current orders for Inpatient glycemic control: Novolog 0-15 units TID, Novolog 0-5 units QHS Solumedrol 125 mg x 1  Inpatient Diabetes Program Recommendations:    Consider adding Levemir 20 units QD.   Thanks, Lujean Rave, MSN, RNC-OB Diabetes Coordinator [phone] (8a-5p)

## 2020-10-06 NOTE — Consult Note (Signed)
ANTICOAGULATION CONSULT NOTE  Pharmacy Consult for Heparin Indication: chest pain/ACS  Allergies  Allergen Reactions  . Minocycline Rash    Patient Measurements: Height: 6\' 1"  (185.4 cm) Weight: 107.5 kg (236 lb 15.9 oz) IBW/kg (Calculated) : 79.9 Heparin Dosing Weight: 99.9 kg  Vital Signs: Temp: 98.5 F (36.9 C) (12/14 2036) Temp Source: Oral (12/14 2036) BP: 118/77 (12/14 2036) Pulse Rate: 85 (12/14 2036)  Labs: Recent Labs    10/05/20 1928 10/05/20 2212 10/06/20 0027 10/06/20 0435 10/06/20 1200 10/06/20 2000  HGB 8.8*  --   --  8.9*  --   --   HCT 30.3*  --   --  28.7*  --   --   PLT 221  --   --  196  --   --   APTT 34  --   --   --   --   --   LABPROT 14.9  --   --   --   --   --   INR 1.2  --   --   --   --   --   HEPARINUNFRC  --   --   --  0.28* 0.29* 0.52  CREATININE 2.50*  --   --  2.17*  --   --   TROPONINIHS 466* 1,230* 903*  --   --   --     Estimated Creatinine Clearance: 41.9 mL/min (A) (by C-G formula based on SCr of 2.17 mg/dL (H)).   Medical History: Past Medical History:  Diagnosis Date  . Anemia due to chronic kidney disease   . CKD (chronic kidney disease), stage III (HCC)   . Hyperlipidemia associated with type 2 diabetes mellitus (HCC)   . Hypertension associated with diabetes (HCC)   . Insulin dependent type 2 diabetes mellitus (HCC)   . PAD (peripheral artery disease) (HCC)     Medications:  (Not in a hospital admission)  Scheduled:  Infusions:  PRN:  Anti-infectives (From admission, onward)   None      Assessment: Heparin Dosing Weight: 99.9 kg Pharmacy consulted for heparin for ACS. Trop elevated. No note of DOAC PTA.   Goal of Therapy:  Heparin level 0.3-0.7 units/ml Monitor platelets by anticoagulation protocol: Yes   12/14 0435 0.28, subtherapeutic 12/14 1200 0.29, subtherapeutic 12/14 2000 0.52, therapeutic.    Plan:  Heparin level is therapeutic. Will continue heparin infusion at 1700 units/hr.  Check  anti-Xa level in 6 hours. CBC daily while on heparin.   1/15, [facility] 10/06/2020 9:38 PM

## 2020-10-06 NOTE — Progress Notes (Signed)
Pt taken off bipap, placed on 6lpm [place], sats 96%, respiratory rate 20/min. Pt stated that his breathing is much better. Will continue to monitor.

## 2020-10-06 NOTE — ED Notes (Signed)
Dietary contacted to send early tray for pt due to pt previously being unable to eat due to bipap use

## 2020-10-06 NOTE — ED Notes (Signed)
Assumed care of this pt at this time.

## 2020-10-06 NOTE — Consult Note (Signed)
ANTICOAGULATION CONSULT NOTE  Pharmacy Consult for Heparin Indication: chest pain/ACS  Allergies  Allergen Reactions   Minocycline Rash    Patient Measurements: Height: 6\' 1"  (185.4 cm) Weight: 100 kg (220 lb 7.4 oz) IBW/kg (Calculated) : 79.9 Heparin Dosing Weight: 99.9 kg  Vital Signs: Temp: 98.2 F (36.8 C) (12/14 0135) Temp Source: Oral (12/14 0135) BP: 158/62 (12/14 0430) Pulse Rate: 73 (12/14 0430)  Labs: Recent Labs    10/05/20 1928 10/05/20 2212 10/06/20 0027 10/06/20 0435  HGB 8.8*  --   --  8.9*  HCT 30.3*  --   --  28.7*  PLT 221  --   --  196  APTT 34  --   --   --   LABPROT 14.9  --   --   --   INR 1.2  --   --   --   HEPARINUNFRC  --   --   --  0.28*  CREATININE 2.50*  --   --   --   TROPONINIHS 466* 1,230* 903*  --     Estimated Creatinine Clearance: 35.2 mL/min (A) (by C-G formula based on SCr of 2.5 mg/dL (H)).   Medical History: Past Medical History:  Diagnosis Date   Anemia due to chronic kidney disease    CKD (chronic kidney disease), stage III (HCC)    Hyperlipidemia associated with type 2 diabetes mellitus (HCC)    Hypertension associated with diabetes (HCC)    Insulin dependent type 2 diabetes mellitus (HCC)    PAD (peripheral artery disease) (HCC)     Medications:  (Not in a hospital admission)  Scheduled:  Infusions:  PRN:  Anti-infectives (From admission, onward)   None      Assessment: Pharmacy consulted for heparin for ACS. Trop elevated. No note of DOAC PTA.   Goal of Therapy:  Heparin level 0.3-0.7 units/ml Monitor platelets by anticoagulation protocol: Yes   12/14 0435 0.28, subtherapeutic   Plan:  Increase infusion rate to 1500 units/hr Check anti-Xa level in 6 hours and daily while on heparin Continue to monitor H&H and platelets  1/15, PharmD, [facility] 10/06/2020 5:23 AM

## 2020-10-06 NOTE — ED Notes (Signed)
Pt placed onto hospital bed at this time for pt comfort and to collect accurate weight.

## 2020-10-06 NOTE — Consult Note (Signed)
ANTICOAGULATION CONSULT NOTE  Pharmacy Consult for Heparin Indication: chest pain/ACS  Allergies  Allergen Reactions  . Minocycline Rash    Patient Measurements: Height: 6\' 1"  (185.4 cm) Weight: 107.5 kg (236 lb 15.9 oz) IBW/kg (Calculated) : 79.9 Heparin Dosing Weight: 99.9 kg  Vital Signs: BP: 146/66 (12/14 1230) Pulse Rate: 73 (12/14 1230)  Labs: Recent Labs    10/05/20 1928 10/05/20 2212 10/06/20 0027 10/06/20 0435 10/06/20 1200  HGB 8.8*  --   --  8.9*  --   HCT 30.3*  --   --  28.7*  --   PLT 221  --   --  196  --   APTT 34  --   --   --   --   LABPROT 14.9  --   --   --   --   INR 1.2  --   --   --   --   HEPARINUNFRC  --   --   --  0.28* 0.29*  CREATININE 2.50*  --   --  2.17*  --   TROPONINIHS 466* 1,230* 903*  --   --     Estimated Creatinine Clearance: 41.9 mL/min (A) (by C-G formula based on SCr of 2.17 mg/dL (H)).   Medical History: Past Medical History:  Diagnosis Date  . Anemia due to chronic kidney disease   . CKD (chronic kidney disease), stage III (HCC)   . Hyperlipidemia associated with type 2 diabetes mellitus (HCC)   . Hypertension associated with diabetes (HCC)   . Insulin dependent type 2 diabetes mellitus (HCC)   . PAD (peripheral artery disease) (HCC)     Medications:  (Not in a hospital admission)  Scheduled:  Infusions:  PRN:  Anti-infectives (From admission, onward)   None      Assessment: Heparin Dosing Weight: 99.9 kg Pharmacy consulted for heparin for ACS. Trop elevated. No note of DOAC PTA.   Goal of Therapy:  Heparin level 0.3-0.7 units/ml Monitor platelets by anticoagulation protocol: Yes   12/14 0435 0.28, subtherapeutic 12/14 1200 0.29, subtherapeutic   Plan:  Bolus 1500 units x1 (~15u/kg) Increase infusion rate to 1700 units/hr (~2u/k/hr increase) Check anti-Xa level in 6 hours and daily while on heparin Continue to monitor H&H and platelets  1/15, [facility] 10/06/2020 1:50 PM

## 2020-10-06 NOTE — ED Notes (Signed)
Pt's wife called, given update on pt's status and plan of care

## 2020-10-06 NOTE — Consult Note (Signed)
Cardiology Consultation:   Patient ID: Anthony Fox; [id number]; May 20, 1951   Admit date: 10/05/2020 Date of Consult: 10/06/2020  Primary Care Provider: Patient, No Pcp Per Primary Cardiologist: New to [facility] - consult by End Primary Electrophysiologist:  None   Patient Profile:   Anthony Fox is a 69 y.o. 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 and Care Everywhere without evidence of Afib), IDDM2, CKD stage IIIb with recurrent hyperkalemia, HTN, HLD, anemia of chronic disease, and prior tobacco use who is being seen today for the evaluation of dyspnea and elevated troponin at the request of Dr. Allena Katz.  History of Present Illness:   Anthony Fox has a documented history of reported A. fib and has been managed on Plavix with details being unclear.  He reports having previously undergone a remote stress test with no cardiac imaging available for review in [facility] or Care Everywhere.  He presented to [facility] on 10/05/2020 with a 51-month history of progressive exertional shortness of breath with associated orthopnea and a 61-month history of lower extremity edema.  He denies any chest pain, palpitations, dizziness, presyncope, or syncope.  At times over the prior 6 months he has had to sleep sitting up in a recliner.  He does report taking a "little blue pill" for fluid.  Review of care everywhere shows he has been prescribed chlorthalidone.  Due to progressive shortness of breath, particularly over the last several days he called EMS on 12/13.  Upon their arrival he was hypoxic with O2 saturation of 89 and was placed on supplemental oxygen.  Upon his arrival to [facility] he was hypertensive with a BP of 153/135 and was briefly placed on BiPAP with symptomatic improvement.  Chest x-ray showed mild cardiomegaly with central pulmonary vascular congestion  and probable mild pulmonary edema.  EKG showed sinus tachycardia with early repolarization abnormalities as outlined below.  Labs were notable for an initial high-sensitivity troponin of 466 with a delta troponin of 1230 subsequently downtrending.  BNP 1365.  D-dimer elevated at 2889.  Covid and influenza negative.  Hgb 8.8.  BUN/SCR 61/2.5 trending to 61/2.17.  Potassium 5.7 trending to 5.5.  AST 45 trending to 36.  ALT 68 trending to 62.  Magnesium 2.1.  TSH normal.  Ferritin 16.  B12 126.  In the ED he received IV Lasix 40 mg x 1, IV Solu-Medrol, Veltassa, duo nebs, and was started on IV heparin drip.  Given his elevated D-dimer he underwent lower extremity venous duplex which was negative for DVT.  He has not undergone pulmonary imaging for his elevated D-dimer.  Upon admission he was placed on IV Lasix 40 mg twice daily.  BP has improved to 140s over 60s.  He remains on supplemental oxygen via nasal cannula though does state his dyspnea is improving.  He denies any chest pain.  "Vascular Interventions: S/p right femoral-popliteal vein bypass angioplasty with drug coated balloon (12/09/19). S/p rt reversed GSV CFA to PTA BPG (09/10/14), h/o bilateral SFA stents, Rt popliteal artery stent, s/p angioplasty of femoral artery stent stenosis (05/30/12), s/p artherectomy and balloon angioplasty of right popliteal artery, Rt TPT and proximal Rt PTA. TPA and balloon of SFA in stent stenosis (03/28/14); h/o left CFA-PTA RSVG performed on 11/29/2011. Right femoral-popliteal vein bypass angioplasty with drug coated balloon (12/09/19)."   Past Medical History:  Diagnosis Date  .  Anemia due to chronic kidney disease   . CKD (chronic kidney disease), stage III (HCC)   . Hyperlipidemia associated with type 2 diabetes mellitus (HCC)   . Hypertension associated with diabetes (HCC)   . Insulin dependent type 2 diabetes mellitus (HCC)   . PAD (peripheral artery disease) (HCC)     Past Surgical History:  Procedure  Laterality Date  . 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     Home Meds: Prior to Admission medications   Medication Sig Start Date End Date Taking? Authorizing Provider  amLODipine (NORVASC) 10 MG tablet Take 10 mg by mouth daily. 07/30/20   [provider]  aspirin 81 MG chewable tablet Chew 81 mg by mouth daily. 12/31/19   [provider]  atorvastatin (LIPITOR) 40 MG tablet Take 40 mg by mouth at bedtime. 07/30/20   [provider]  carvedilol (COREG) 25 MG tablet Take 25 mg by mouth 2 (two) times daily. 07/30/20   [provider]  chlorthalidone (HYGROTON) 50 MG tablet Take 50 mg by mouth daily. 09/14/20   [provider]  clopidogrel (PLAVIX) 75 MG tablet Take 75 mg by mouth daily. 07/30/20   [provider]  ferrous sulfate 325 (65 FE) MG tablet Take 325 mg by mouth daily with breakfast.    [provider]  gabapentin (NEURONTIN) 300 MG capsule Take 300 mg by mouth at bedtime. 09/22/20   [provider]  hydrALAZINE (APRESOLINE) 50 MG tablet Take 50 mg by mouth 2 (two) times daily. 07/30/20   [provider]  lisinopril (ZESTRIL) 5 MG tablet Take 10 mg by mouth daily. 09/16/20   [provider]  sodium zirconium cyclosilicate (LOKELMA) 10 g PACK packet Take 10 g by mouth every other day.    [provider]    Inpatient Medications: Scheduled Meds: . amLODipine  10 mg Oral Daily  . atorvastatin  40 mg Oral Daily  . carvedilol  25 mg Oral BID WC  . furosemide  40 mg Intravenous Q12H  . hydrALAZINE  50 mg Oral BID  . insulin aspart  0-15 Units Subcutaneous TID WC  . insulin aspart  0-5 Units Subcutaneous QHS  . insulin detemir  20 Units Subcutaneous Daily  . patiromer  16.8 g Oral Daily   Continuous Infusions: . heparin 1,500 Units/hr (10/06/20 1158)   PRN Meds: acetaminophen **OR** acetaminophen, ondansetron **OR** ondansetron (ZOFRAN) IV  Allergies:   Allergies  Allergen  Reactions  . Minocycline Rash    Social History:   Social History   Socioeconomic History  . Marital status: Married    Spouse name: Not on file  . Number of children: Not on file  . Years of education: Not on file  . Highest education level: Not on file  Occupational History  . Not on file  Tobacco Use  . Smoking status: Former Games developer  . Smokeless tobacco: Never Used  . Tobacco comment: 10/05/20: Quit many years ago  Substance and Sexual Activity  . Alcohol use: Never  . Drug use: Never  . Sexual activity: Not on file  Other Topics Concern  . Not on file  Social History Narrative  . Not on file   Social Determinants of Health   Financial Resource Strain: Not on file  Food Insecurity: Not on file  Transportation Needs: Not on file  Physical Activity: Not on file  Stress: Not on file  Social Connections: Not on file  Intimate Partner Violence: Not on file  Family History:   Family History  Problem Relation Age of Onset  . Diabetes Mother   . Hypertension Mother   . Diabetes Brother     ROS:  Review of Systems  Constitutional: Positive for malaise/fatigue. Negative for chills, diaphoresis, fever and weight loss.  HENT: Negative for congestion.   Eyes: Negative for discharge and redness.  Respiratory: Positive for shortness of breath. Negative for cough, sputum production and wheezing.   Cardiovascular: Positive for orthopnea. Negative for chest pain, palpitations, claudication, leg swelling and PND.  Gastrointestinal: Negative for abdominal pain, heartburn, nausea and vomiting.  Musculoskeletal: Negative for falls and myalgias.  Skin: Negative for rash.  Neurological: Positive for weakness. Negative for dizziness, tingling, tremors, sensory change, speech change, focal weakness and loss of consciousness.  Endo/Heme/Allergies: Does not bruise/bleed easily.  Psychiatric/Behavioral: Negative for substance abuse. The patient is not nervous/anxious.   All other  systems reviewed and are negative.     Physical Exam/Data:   Vitals:   10/06/20 1000 10/06/20 1030 10/06/20 1100 10/06/20 1130  BP: (!) 150/72 (!) 144/73 139/68 (!) 144/69  Pulse: 75 70 72 71  Resp: 20     Temp:      TempSrc:      SpO2: 94% 91% 92% (!) 89%  Weight:      Height:        Intake/Output Summary (Last 24 hours) at 10/06/2020 1215 Last data filed at 10/06/2020 1014 Gross per 24 hour  Intake --  Output 2800 ml  Net -2800 ml   Filed Weights   10/05/20 1849 10/06/20 0545  Weight: 100 kg 107.5 kg   Body mass index is 31.27 kg/m.   Physical Exam: General: Well developed, well nourished, in no acute distress. Head: Normocephalic, atraumatic, sclera non-icteric, no xanthomas, nares without discharge.  Neck: Negative for carotid bruits. JVD elevated ~ 10 cm. Lungs: Bibasilar crackles. Breathing is unlabored. Heart: RRR with S1 S2. No murmurs, rubs, or gallops appreciated. Abdomen: Soft, non-tender, non-distended with normoactive bowel sounds. No hepatomegaly. No rebound/guarding. No obvious abdominal masses. Msk:  Strength and tone appear normal for age. Extremities: No clubbing or cyanosis. No edema. Distal pedal pulses are 2+ and equal bilaterally. Neuro: Alert and oriented X 3. No facial asymmetry. No focal deficit. Moves all extremities spontaneously. Psych:  Responds to questions appropriately with a normal affect.   EKG:  The EKG was personally reviewed and demonstrates: NSR, 77 bpm, right axis deviation, LVH with early repolarization abnormalities  Telemetry:  Telemetry was personally reviewed and demonstrates: SR with PACs  Weights: Filed Weights   10/05/20 1849 10/06/20 0545  Weight: 100 kg 107.5 kg    Relevant CV Studies:  2D echo pending  Laboratory Data:  Chemistry Recent Labs  Lab 10/05/20 1928 10/06/20 0435  NA 139 141  K 5.7* 5.5*  CL 109 110  CO2 20* 21*  GLUCOSE 305* 281*  BUN 61* 61*  CREATININE 2.50* 2.17*  CALCIUM 8.8* 8.9   GFRNONAA 27* 32*  ANIONGAP 10 10    Recent Labs  Lab 10/05/20 1928 10/06/20 0435  PROT 8.2* 7.6  ALBUMIN 3.9 3.6  AST 45* 36  ALT 68* 62*  ALKPHOS 115 107  BILITOT 1.5* 1.3*   Hematology Recent Labs  Lab 10/05/20 1928 10/06/20 0435  WBC 8.7 8.8  RBC 3.46* 3.37*  HGB 8.8* 8.9*  HCT 30.3* 28.7*  MCV 87.6 85.2  MCH 25.4* 26.4  MCHC 29.0* 31.0  RDW 15.3 15.2  PLT 221 196  Cardiac EnzymesNo results for input(s): TROPONINI in the last 168 hours. No results for input(s): TROPIPOC in the last 168 hours.  BNP Recent Labs  Lab 10/05/20 1928  BNP 1,365.1*    DDimer No results for input(s): DDIMER in the last 168 hours.  Radiology/Studies:  US Venous Img Lower Bilateral  Result Date: 10/05/2020 IMPRESSION: 1. No sonographic findings of DVT. 2. 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 Electronically Signed   By: Elgie Collard M.D.   On: 10/05/2020 23:18   DG Chest Portable 1 View  Result Date: 10/05/2020 IMPRESSION: Mild cardiomegaly with central pulmonary vascular congestion and probable mild bilateral pulmonary edema. Electronically Signed   By: Lupita Raider M.D.   On: 10/05/2020 19:49    Assessment and Plan:   1.  Acute hypoxic respiratory distress/acute CHF/elevated D-dimer: -Type unknown, with pending echo -Respiratory status is improved though he does remain volume overloaded -Check echo with recommendations for ischemic evaluation and escalation of evidence-based medical therapy pending these results -Continue IV Lasix 40 mg twice daily and carvedilol -Not currently on ACE inhibitor/ARB with acute on CKD -He would not be a candidate for spironolactone moving forward regardless of echo findings given his history of underlying CKD with recurrent hyperkalemia -Ischemic evaluation will be needed as outlined below following improvement in his respiratory status and diuresis  -He was noted to have an elevated D-dimer with associated hypoxia, sinus tachycardia, and tachypnea.   Recommend further evaluation of this per internal medicine.  CTA chest is not ideal given his underlying renal dysfunction, therefore VQ scan should be considered -Continue to wean supplemental oxygen as able per primary service -CHF education -Daily weights -Strict I's and O's  2.  Elevated troponin: -Never with chest pain though with significant exertional dyspnea in a patient with multiple comorbid conditions including insulin-dependent diabetes mellitus, prior tobacco use, hypertension, hyperlipidemia, male sex, and obesity -Troponin peaked at approximately 1200 and is currently downtrending -ASA -Heparin drip for now -Echo pending -He will need an ischemic evaluation prior to discharge following improvement in respiratory status with diuresis as he is currently orthopneic and unable to lay flat -Check lipid panel for further risk stratification  3.  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  Insulin-dependent diabetes mellitus: -A1c 8.1 this admission -SSI per IM  5.  Anemia of chronic disease/iron deficiency: -Hgb appears stable -Monitor on heparin drip  6.  Reported PAF: -[facility] note indicates patient has a history of PAF and has been managed on Plavix -Review of [place] leak and Care Everywhere shows no objective evidence of A. Fib -Currently maintaining sinus rhythm -Monitor on telemetry -Without objective evidence of A. fib and in the setting of the patient's comorbid conditions including significant anemia in the 8 range and underlying CKD would defer initiating OAC  -He does remain on heparin drip at this time though this is in the setting of elevated troponin  7.  PAD: -Followed by [facility] vascular surgery -Plavix on hold while he is on heparin per internal medicine -He remains on atorvastatin  8.  HTN: -Blood pressure  improving -Continue current medical therapy including carvedilol, amlodipine, and IV Lasix -Lisinopril held with AKI and hyperkalemia  -Chlorthalidone held in the setting of IV Lasix use  9.  HLD: -Check lipid panel with goal LDL being less than 70 -PTA atorvastatin   For questions or updates, please contact  [facility] Please consult [email or web address] for contact info under Cardiology/STEMI.   Signed, Eula Listen, PA-C [facility] Pager: [phone] 10/06/2020, 12:15 PM

## 2020-10-06 NOTE — Progress Notes (Signed)
PROGRESS NOTE    Anthony Fox  QQI:[id number] DOB: 08-05-1951 DOA: 10/05/2020 PCP: Patient, No Pcp Per   Brief Narrative:  HPI: Anthony Fox is a 69 y.o. male with medical history significant for insulin-dependent type 2 diabetes, PAD (s/p right femoropopliteal angioplasty with drug-coated balloon to 12/06/2019), PAF on aspirin/Plavix, CKD stage III, hypertension, hyperlipidemia, recurrent hyperkalemia, anemia of chronic kidney disease who presents to the ED for evaluation of shortness of breath.  Majority of care is through [facility] all prior records in [place].  Patient states he has been having 3 or 4 months of progressive shortness of breath.  He has been having notable orthopnea, dyspnea on exertion, paroxysmal nocturnal dyspnea, and subjective weight gain.  Symptoms significantly worsened over the last few days and eventually called EMS earlier today.  Per ED documentation he was found to be hypoxic with SPO2 in the low 80s on their arrival.  He was brought to the ED for further evaluation.  Patient denies any recent chest pain.  He has occasional palpitations.  He has a cough productive of white sputum.  He reports he had good urine output without dysuria.  He has not seen any obvious bleeding including epistaxis, hemoptysis, hematemesis, hematuria, hematochezia, or melena.  He denies any nausea, vomiting, subjective fevers, chills, diaphoresis, abdominal pain.  He is a former smoker and says he quit many years ago.  He denies any alcohol or illicit drug use.  He reports a history of allergy to minocycline which has caused a rash in the past.  He states he has been taking his medications regularly.  He denies any known personal history of cardiac or pulmonary issues.  ED Course:  Initial vitals showed BP 153/135, pulse 88, RR 25, temp 98.8 F, SPO2 99% on aerosol mask.  Patient was placed on BiPAP with reported symptomatic improvement.  Labs are notable for sodium 139, potassium  5.7, bicarb 20, BUN 61, creatinine 2.50 (baseline ~1.6 in Care Everywhere), serum glucose 305, AST 45, ALT 68, alk phos 115, total bilirubin 1.5, WBC 8.7, hemoglobin 8.8, platelets 221,000, high-sensitivity troponin I 466, BNP 1365.1, fibrin derivatives D-dimer 2889.05.  SARS-CoV-2 PCR panel was collected and pending.  Portable chest x-ray shows mild cardiomegaly with central pulmonary vascular congestion and bilateral pulmonary edema.  Patient was given IV Lasix 40 mg once, IV Solu-Medrol 125 mg, IV NovoLog 6 units, Veltassa, DuoNeb treatment, and started on IV heparin.  Lower extremity Dopplers were ordered and pending.  The hospitalist service was consulted to admit for further evaluation and management.  Assessment & Plan:   Principal Problem:   Acute respiratory failure with hypoxia (HCC) Active Problems:   DM (diabetes mellitus) type II uncontrolled, periph vascular disorder (HCC)   PAD (peripheral artery disease) (HCC)   Hypertension associated with diabetes (HCC)   Anemia of chronic kidney failure, stage 3 (moderate) (HCC)   Paroxysmal atrial fibrillation (HCC)   CKD (chronic kidney disease) stage 3, GFR 30-59 ml/min (HCC)   Hyperkalemia   Elevated troponin   Acute CHF (congestive heart failure) (HCC)   Hyperlipidemia associated with type 2 diabetes mellitus (HCC)   Acute renal failure superimposed on stage 3 chronic kidney disease ([place])   Acute respiratory failure with hypoxia due to acute CHF: BNP is elevated and patient hypoxic on arrival, requiring BiPAP on admission.  Clinical picture suggestive of acute decompensated CHF, no known prior history of cardiac issues.  Feels much better today.  Troponin elevation suspect due to demand ischemia.  He denies any chest pain.  Continue Lasix 40 mg IV twice daily.  Echo pending.  Continue Coreg.  He is only on 4 L nasal cannula now.  Cardiology on board.  Appreciate their help.  Elevated troponin/?  Possible NSTEMI: Although patient  denies any chest pain currently but he did present with shortness of breath which is one of the symptom of ACS.  Troponin initially was 466 and then jumped to 1230 and 903 and this is concerning for possible NSTEMI.  I have consulted cardiology.  He remains on heparin drip for possible PE anyway.  Per cardiology, patient will require further cardiac work-up with cardiac catheterization as I had suspected.  AKI on CKD stage III: Baseline creatinine in Care Everywhere appears to be around 1.6.  Creatinine on admission is 2.5 which improved to 2.17 today.  Suspect secondary to CHF plus medication effect. -Continue diuresis as above -Continue to hold home lisinopril and chlorthalidone -Avoid NSAIDs  Hyperkalemia: K 5.7 on admission, appears to be recurrent issue per care everywhere records. -S/p Veltassa and NovoLog in the ED.  Improved to 5.5. -Continue IV Lasix diuresis and hopefully this will improve. -Continue to hold home lisinopril  Elevated D-dimer: PE still on differential but cannot obtain CTA chest due to renal dysfunction.  Lower extremity Dopplers negative for DVT.  He needs heparin for his possible NSTEMI anemia so we will continue that and hopefully once his renal function improves or his acute pulmonary edema improves, we can either obtain CT angiogram of the chest or VQ scan.  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  Follows with [facility] vascular surgery, Dr. Ronita Hipps. Aspirin resumed by cardiology. -Continue statin  Paroxysmal atrial fibrillation: History of PAF per Care Everywhere.  In sinus rhythm on admission.  CHA2DS2-VASc score is at least 4.  He is on aspirin/Plavix as an outpatient.   Patient himself is not aware of any diagnosis of atrial fibrillation that has mentioned to him ever by his physicians.  Continue IV heparin and aspirin as well as Coreg.  Hyperglycemia in insulin-dependent type 2 diabetes: Hemoglobin A1c 8.1.  Patient not on any antidiabetics.  Currently hyperglycemic while on SSI with blood sugar around 250.  Will start on Levemir 20 units and continue SSI.  Hypertension: Controlled. Continue Coreg, amlodipine, and IV Lasix as above.  Holding lisinopril and chlorthalidone.  Hyperlipidemia: Continue atorvastatin.  Anemia of chronic kidney disease and iron deficiency: Hemoglobin 8.8 on admission, decreased from previous value of 12.0 on 07/16/2020.  Patient denies any obvious bleeding.  Iron studies indicate above iron deficiency anemia.  Will check FOBT.  DVT prophylaxis:    Code Status: Full Code  Family Communication:  None present at bedside.  Plan of care discussed with patient in length and he verbalized understanding and agreed with it.  Status is: Inpatient  Remains inpatient appropriate because:Ongoing diagnostic testing needed not appropriate for outpatient work up   Dispo: The patient is from: Home              Anticipated d/c is to: Home              Anticipated d/c date is: 2 days  Patient currently is not medically stable to d/c.        Estimated body mass index is 31.27 kg/m as calculated from the following:   Height as of this encounter: 6\' 1"  (1.854 m).   Weight as of this encounter: 107.5 kg.      Nutritional status:               Consultants:   Cardiology  Procedures:   None  Antimicrobials:  Anti-infectives (From admission, onward)   None         Subjective: Patient seen and examined.  He feels much better.  He states that his breathing is almost back to normal however he is still requiring about 4 L of oxygen.  Objective: Vitals:   10/06/20 1100 10/06/20 1130 10/06/20 1230  10/06/20 1300  BP: 139/68 (!) 144/69 (!) 146/66 118/77  Pulse: 72 71 73 70  Resp:      Temp:      TempSrc:      SpO2: 92% (!) 89% 92% 94%  Weight:      Height:        Intake/Output Summary (Last 24 hours) at 10/06/2020 1417 Last data filed at 10/06/2020 1014 Gross per 24 hour  Intake --  Output 2800 ml  Net -2800 ml   Filed Weights   10/05/20 1849 10/06/20 0545  Weight: 100 kg 107.5 kg    Examination:  General exam: Appears calm and comfortable  Respiratory system: Crackles bilaterally. Respiratory effort normal. Cardiovascular system: S1 & S2 heard, RRR. No JVD, murmurs, rubs, gallops or clicks.  +1 pitting edema bilateral lower extremity Gastrointestinal system: Abdomen is nondistended, soft and nontender. No organomegaly or masses felt. Normal bowel sounds heard. Central nervous system: Alert and oriented. No focal neurological deficits. Extremities: Symmetric 5 x 5 power. Skin: No rashes, lesions or ulcers Psychiatry: Judgement and insight appear normal. Mood & affect appropriate.    Data Reviewed: I have personally reviewed following labs and imaging studies  CBC: Recent Labs  Lab 10/05/20 1928 10/06/20 0435  WBC 8.7 8.8  NEUTROABS 7.5  --   HGB 8.8* 8.9*  HCT 30.3* 28.7*  MCV 87.6 85.2  PLT 221 196   Basic Metabolic Panel: Recent Labs  Lab 10/05/20 1928 10/06/20 0435  NA 139 141  K 5.7* 5.5*  CL 109 110  CO2 20* 21*  GLUCOSE 305* 281*  BUN 61* 61*  CREATININE 2.50* 2.17*  CALCIUM 8.8* 8.9  MG  --  2.1   GFR: Estimated Creatinine Clearance: 41.9 mL/min (A) (by C-G formula based on SCr of 2.17 mg/dL (H)). Liver Function Tests: Recent Labs  Lab 10/05/20 1928 10/06/20 0435  AST 45* 36  ALT 68* 62*  ALKPHOS 115 107  BILITOT 1.5* 1.3*  PROT 8.2* 7.6  ALBUMIN 3.9 3.6   No results for input(s): LIPASE, AMYLASE in the last 168 hours. No results for input(s): AMMONIA in the last 168 hours. Coagulation Profile: Recent Labs  Lab  10/05/20 1928  INR 1.2   Cardiac Enzymes: No results for input(s): CKTOTAL, CKMB, CKMBINDEX, TROPONINI in the last 168 hours. BNP (last 3 results) No results for input(s): PROBNP in the last 8760 hours. HbA1C: Recent Labs    10/06/20 0435  HGBA1C 8.1*   CBG: Recent Labs  Lab 10/05/20 2340 10/06/20 0747 10/06/20 1031 10/06/20 1132  GLUCAP 248* 267* 300* 318*   Lipid Profile: Recent Labs    10/06/20 0435  CHOL 101  HDL 28*  LDLCALC  61  TRIG 62  CHOLHDL 3.6   Thyroid Function Tests: Recent Labs    10/06/20 0435  TSH 0.451   Anemia Panel: Recent Labs    10/06/20 0434 10/06/20 0435  VITAMINB12 126*  --   FOLATE  --  15.9  FERRITIN  --  16*  TIBC  --  364  IRON  --  21*   Sepsis Labs: No results for input(s): PROCALCITON, LATICACIDVEN in the last 168 hours.  Recent Results (from the past 240 hour(s))  Resp Panel by RT-PCR (Flu A&B, Covid) Nasopharyngeal Swab     Status: None   Collection Time: 10/05/20  9:01 PM   Specimen: Nasopharyngeal Swab; Nasopharyngeal(NP) swabs in vial transport medium  Result Value Ref Range Status   SARS Coronavirus 2 by RT PCR NEGATIVE NEGATIVE Final    Comment: (NOTE) SARS-CoV-2 target nucleic acids are NOT DETECTED.  The SARS-CoV-2 RNA is generally detectable in upper respiratory specimens during the acute phase of infection. The lowest concentration of SARS-CoV-2 viral copies this assay can detect is 138 copies/mL. A negative result does not preclude SARS-Cov-2 infection and should not be used as the sole basis for treatment or other patient management decisions. A negative result may occur with  improper specimen collection/handling, submission of specimen other than nasopharyngeal swab, presence of viral mutation(s) within the areas targeted by this assay, and inadequate number of viral copies(<138 copies/mL). A negative result must be combined with clinical observations, patient history, and  epidemiological information. The expected result is Negative.  Fact Sheet for Patients:  EntrepreneurPulse.com.au  Fact Sheet for Healthcare Providers:  IncredibleEmployment.be  This test is no t yet approved or cleared by the [place] FDA and  has been authorized for detection and/or diagnosis of SARS-CoV-2 by FDA under an Emergency Use Authorization (EUA). This EUA will remain  in effect (meaning this test can be used) for the duration of the COVID-19 declaration under Section 564(b)(1) of the Act, 21 U.S.C.section 360bbb-3(b)(1), unless the authorization is terminated  or revoked sooner.       Influenza A by PCR NEGATIVE NEGATIVE Final   Influenza B by PCR NEGATIVE NEGATIVE Final    Comment: (NOTE) The Xpert Xpress SARS-CoV-2/FLU/RSV plus assay is intended as an aid in the diagnosis of influenza from Nasopharyngeal swab specimens and should not be used as a sole basis for treatment. Nasal washings and aspirates are unacceptable for Xpert Xpress SARS-CoV-2/FLU/RSV testing.  Fact Sheet for Patients: EntrepreneurPulse.com.au  Fact Sheet for Healthcare Providers: IncredibleEmployment.be  This test is not yet approved or cleared by the [place] FDA and has been authorized for detection and/or diagnosis of SARS-CoV-2 by FDA under an Emergency Use Authorization (EUA). This EUA will remain in effect (meaning this test can be used) for the duration of the COVID-19 declaration under Section 564(b)(1) of the Act, 21 U.S.C. section 360bbb-3(b)(1), unless the authorization is terminated or revoked.  Performed at [facility], [address], [place], [place] [address]       Radiology Studies: US Venous Img Lower Bilateral  Result Date: 10/05/2020 CLINICAL DATA:  69 year old male with leg swelling. EXAM: Bilateral LOWER EXTREMITY VENOUS DOPPLER ULTRASOUND TECHNIQUE: Gray-scale sonography  with compression, as well as color and duplex ultrasound, were performed to evaluate the deep venous system(s) from the level of the common femoral vein through the popliteal and proximal calf veins. COMPARISON:  None. FINDINGS: VENOUS Normal compressibility of the common femoral, superficial femoral, and popliteal veins, as well as the  visualized calf veins. Visualized portions of profunda femoral vein and great saphenous vein unremarkable. No filling defects to suggest DVT on grayscale or color Doppler imaging. Doppler waveforms show normal direction of venous flow, normal respiratory plasticity and response to augmentation. Limited views of the contralateral common femoral vein are unremarkable. OTHER There is a 2.0 x 0.8 x 1.1 cm cystic structure in the left popliteal fossa, likely Baker's cyst. There is mild subcutaneous edema of the left lower extremity. Limitations: none IMPRESSION: 1. No sonographic findings of DVT. 2. 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 Electronically Signed   By: Anner Crete M.D.   On: 10/05/2020 23:18   DG Chest Portable 1 View  Result Date: 10/05/2020 CLINICAL DATA:  Shortness of breath. EXAM: PORTABLE CHEST 1 VIEW COMPARISON:  None. FINDINGS: Mild cardiomegaly is noted. No pneumothorax or pleural effusion is noted. Mild central pulmonary vascular congestion is noted. Mild interstitial densities are noted in the perihilar and basilar regions concerning for pulmonary edema. Bony thorax is unremarkable. IMPRESSION: Mild cardiomegaly with central pulmonary vascular congestion and probable mild bilateral pulmonary edema. Electronically Signed   By: Marijo Conception M.D.   On: 10/05/2020 19:49    Scheduled Meds: . amLODipine  10 mg Oral Daily  . atorvastatin  40 mg Oral Daily  . carvedilol  25 mg Oral BID WC  . furosemide  40 mg Intravenous Q12H  . hydrALAZINE  50 mg Oral BID  . insulin aspart  0-15 Units Subcutaneous TID WC  . insulin aspart  0-5 Units Subcutaneous QHS  . insulin  detemir  20 Units Subcutaneous Daily  . patiromer  16.8 g Oral Daily   Continuous Infusions: . heparin 1,700 Units/hr (10/06/20 1406)     LOS: 1 day   Time spent: 39 minutes   Darliss Cheney, MD [facility]  10/06/2020, 2:17 PM   To contact the attending provider between 7A-7P or the covering provider during after hours 7P-7A, please log into the web site [email or web address].

## 2020-10-07 ENCOUNTER — Other Ambulatory Visit: Payer: Self-pay

## 2020-10-07 ENCOUNTER — Inpatient Hospital Stay: Payer: Medicare HMO

## 2020-10-07 ENCOUNTER — Encounter: Payer: Self-pay | Admitting: Internal Medicine

## 2020-10-07 DIAGNOSIS — I480 Paroxysmal atrial fibrillation: Secondary | ICD-10-CM

## 2020-10-07 DIAGNOSIS — I509 Heart failure, unspecified: Secondary | ICD-10-CM

## 2020-10-07 DIAGNOSIS — I739 Peripheral vascular disease, unspecified: Secondary | ICD-10-CM

## 2020-10-07 LAB — CBC WITH DIFFERENTIAL/PLATELET
Abs Immature Granulocytes: 0.08 10*3/uL — ABNORMAL HIGH (ref 0.00–0.07)
Basophils Absolute: 0 10*3/uL (ref 0.0–0.1)
Basophils Relative: 0 %
Eosinophils Absolute: 0.1 10*3/uL (ref 0.0–0.5)
Eosinophils Relative: 0 %
HCT: 27.2 % — ABNORMAL LOW (ref 39.0–52.0)
Hemoglobin: 8 g/dL — ABNORMAL LOW (ref 13.0–17.0)
Immature Granulocytes: 1 %
Lymphocytes Relative: 9 %
Lymphs Abs: 1 10*3/uL (ref 0.7–4.0)
MCH: 25.3 pg — ABNORMAL LOW (ref 26.0–34.0)
MCHC: 29.4 g/dL — ABNORMAL LOW (ref 30.0–36.0)
MCV: 86.1 fL (ref 80.0–100.0)
Monocytes Absolute: 0.9 10*3/uL (ref 0.1–1.0)
Monocytes Relative: 8 %
Neutro Abs: 9.2 10*3/uL — ABNORMAL HIGH (ref 1.7–7.7)
Neutrophils Relative %: 82 %
Platelets: 212 10*3/uL (ref 150–400)
RBC: 3.16 MIL/uL — ABNORMAL LOW (ref 4.22–5.81)
RDW: 15 % (ref 11.5–15.5)
WBC: 11.3 10*3/uL — ABNORMAL HIGH (ref 4.0–10.5)
nRBC: 1.1 % — ABNORMAL HIGH (ref 0.0–0.2)

## 2020-10-07 LAB — GLUCOSE, CAPILLARY
Glucose-Capillary: 187 mg/dL — ABNORMAL HIGH (ref 70–99)
Glucose-Capillary: 209 mg/dL — ABNORMAL HIGH (ref 70–99)
Glucose-Capillary: 196 mg/dL — ABNORMAL HIGH (ref 70–99)
Glucose-Capillary: 197 mg/dL — ABNORMAL HIGH (ref 70–99)

## 2020-10-07 LAB — BASIC METABOLIC PANEL
Anion gap: 9 (ref 5–15)
BUN: 74 mg/dL — ABNORMAL HIGH (ref 8–23)
CO2: 21 mmol/L — ABNORMAL LOW (ref 22–32)
Calcium: 8.7 mg/dL — ABNORMAL LOW (ref 8.9–10.3)
Chloride: 110 mmol/L (ref 98–111)
Creatinine, Ser: 2.09 mg/dL — ABNORMAL HIGH (ref 0.61–1.24)
GFR, Estimated: 34 mL/min — ABNORMAL LOW (ref >60)
Glucose, Bld: 213 mg/dL — ABNORMAL HIGH (ref 70–99)
Potassium: 4.8 mmol/L (ref 3.5–5.1)
Sodium: 140 mmol/L (ref 135–145)

## 2020-10-07 LAB — HEPARIN LEVEL (UNFRACTIONATED): Heparin Unfractionated: 0.51 IU/mL (ref 0.30–0.70)

## 2020-10-07 LAB — ECHOCARDIOGRAM COMPLETE
Height: 73 in
Weight: 3791.91 oz

## 2020-10-07 MED ORDER — PNEUMOCOCCAL VAC POLYVALENT 25 MCG/0.5ML IJ INJ: 0.5000 mL | INJECTION | INTRAMUSCULAR | Status: DC

## 2020-10-07 MED ORDER — VITAMIN B-12 1000 MCG PO TABS
1000.0000 ug | ORAL_TABLET | Freq: Every day | ORAL | Status: DC
  Administered 2020-10-07 – 2020-10-09 (×3): 1000 ug via ORAL
  Filled 2020-10-07 (×3): qty 1

## 2020-10-07 MED ORDER — TECHNETIUM TO 99M ALBUMIN AGGREGATED
4.0000 | Freq: Once | INTRAVENOUS | Status: AC | PRN
  Administered 2020-10-07: 15:00:00 4.16 via INTRAVENOUS

## 2020-10-07 MED ORDER — FERROUS SULFATE 325 (65 FE) MG PO TABS
325.0000 mg | ORAL_TABLET | Freq: Two times a day (BID) | ORAL | Status: DC
  Administered 2020-10-07 – 2020-10-09 (×4): 325 mg via ORAL
  Filled 2020-10-07 (×5): qty 1

## 2020-10-07 NOTE — Progress Notes (Signed)
PROGRESS NOTE    Anthony Fox  UYQ:[id number] DOB: 1951-02-20 DOA: 10/05/2020 PCP: Patient, No Pcp Per   Brief Narrative:  69 y.o.malewith medical history significant forinsulin-dependent type 2 diabetes, PAD (s/p right femoropopliteal angioplasty with drug-coated balloon to 12/06/2019), PAF on aspirin/Plavix, CKD stage III, hypertension, hyperlipidemia, recurrent hyperkalemia, anemia of chronic kidney disease who presents to the ED for evaluation of shortness of breath.Majority of care is through [facility] all prior records in Care Everywhere.  Patient states he has been having 3 or 4 months of progressive shortness of breath. He has been having notable orthopnea, dyspnea on exertion, paroxysmal nocturnal dyspnea, and subjective weight gain. Symptoms significantly worsened over the last few days and eventually called EMS  12/15: Patient reports improvement in respiratory symptoms.  Net -3 L since admission.  Diuresing well.   Assessment & Plan:   Principal Problem:   Acute respiratory failure with hypoxia (HCC) Active Problems:   DM (diabetes mellitus) type II uncontrolled, periph vascular disorder (HCC)   PAD (peripheral artery disease) (HCC)   Hypertension associated with diabetes (HCC)   Anemia of chronic kidney failure, stage 3 (moderate) (HCC)   Paroxysmal atrial fibrillation (HCC)   CKD (chronic kidney disease) stage 3, GFR 30-59 ml/min (HCC)   Hyperkalemia   Elevated troponin   Acute CHF (congestive heart failure) (HCC)   Hyperlipidemia associated with type 2 diabetes mellitus (HCC)   Acute renal failure superimposed on stage 3 chronic kidney disease (HCC)  Acute on chronic decompensated diastolic congestive heart failure Acute respiratory failure secondary to above BNP elevated patient required BiPAP on admission Has improved substantially Net -3 L Plan: Continue Lasix 40 mg IV twice daily Continue carvedilol Wean oxygen as tolerated Appreciate cardiology  recommendations  Elevated troponin Unable to rule out ACS/NSTEMI Troponin peaked at 1230 Cardiology on board Patient on heparin drip Plan: Continue heparin infusion for now, plan for 48 hours Plan for transition to DOAC No plans for inpatient catheterization Consideration for ischemic work-up as outpatient  Paroxysmal atrial fibrillation CHA2DS2-VASc 5 Heart rate currently controlled On heparin gtt. Plan: Continue Coreg Continue heparin GTT Switch to Eliquis on discharge  Peripheral arterial disease Currently on Lipitor, will continue Continue aspirin Continue heparin GTT Plan to switch to Plavix and Eliquis on discharge  Acute kidney injury on chronic kidney disease stage IIIa Suspect cardiorenal syndrome secondary to decompensated congestive heart failure Creatinine improving over interval Continue diuresis as above Avoid nephrotoxins Daily BMP  Hyperkalemia Resolved status post Veltassa Continue IV diuresis Hold ACE inhibitor Daily BMP  Insulin-dependent type 2 diabetes mellitus with hyperglycemia Hemoglobin A1c 8.1 Not on any diabetic medications at home Continue Levemir 20 units and SSI  Essential hypertension Adequate control over interval Continue Coreg, amlodipine, Lasix Holding lisinopril chlorthalidone  Hyperlipidemia Continue atorvastatin  Anemia of chronic kidney disease Iron deficiency anemia Goal hemoglobin greater than 8 Patient had hemoglobin 8.8 on admission, decreased from previous 12 on 07/16/2020 No signs of bleeding  Elevated D-dimer Unclear etiology Unable to fully exclude VTE Bilateral lower extremity duplex negative CTA contraindicated in the setting of AKI Plan: We will pursue a VQ scan    DVT prophylaxis: Heparin GTT Code Status: Full Family Communication: None today Disposition Plan: Status is: Inpatient  Remains inpatient appropriate because:Inpatient level of care appropriate due to severity of illness   Dispo:  The patient is from: Home              Anticipated d/c is to: Home  Anticipated d/c date is: 2 days              Patient currently is not medically stable to d/c.   Continues to require intravenous diuretics for decompensated heart failure/fluid overload.  Anticipate 48 hours before disposition planning.  Anticipate discharge back home.      Consultants:   Cardiology-[facility]  Procedures:   None  Antimicrobials:   None   Subjective: Patient seen and examined.  Reports improvement in shortness of breath.  No pain complaints  Objective: Vitals:   10/07/20 0251 10/07/20 0313 10/07/20 0745 10/07/20 1119  BP: (!) 118/108 139/81 140/86 (!) 121/57  Pulse: 80 78 96 77  Resp: 17 18    Temp: 98 F (36.7 C) 98.6 F (37 C) 98.6 F (37 C) 98.4 F (36.9 C)  TempSrc: Oral Oral Oral Oral  SpO2: 91% 98% 93% 95%  Weight:  106.1 kg    Height:        Intake/Output Summary (Last 24 hours) at 10/07/2020 1537 Last data filed at 10/07/2020 1500 Gross per 24 hour  Intake 557.85 ml  Output 900 ml  Net -342.15 ml   Filed Weights   10/05/20 1849 10/06/20 0545 10/07/20 0313  Weight: 100 kg 107.5 kg 106.1 kg    Examination:  General exam: Appears calm and comfortable  Respiratory system: Mild bibasilar crackles.  Normal work of breathing.  4 L Cardiovascular system: S1 & S2 heard, RRR. No JVD, murmurs, rubs, gallops or clicks.  2+ pitting edema bilaterally Gastrointestinal system: Abdomen is nondistended, soft and nontender. No organomegaly or masses felt. Normal bowel sounds heard. Central nervous system: Alert and oriented. No focal neurological deficits. Extremities: Symmetric 5 x 5 power. Skin: No rashes, lesions or ulcers Psychiatry: Judgement and insight appear normal. Mood & affect appropriate.     Data Reviewed: I have personally reviewed following labs and imaging studies  CBC: Recent Labs  Lab 10/05/20 1928 10/06/20 0435 10/07/20 0518  WBC 8.7 8.8  11.3*  NEUTROABS 7.5  --  9.2*  HGB 8.8* 8.9* 8.0*  HCT 30.3* 28.7* 27.2*  MCV 87.6 85.2 86.1  PLT 221 196 212   Basic Metabolic Panel: Recent Labs  Lab 10/05/20 1928 10/06/20 0435 10/07/20 0518  NA 139 141 140  K 5.7* 5.5* 4.8  CL 109 110 110  CO2 20* 21* 21*  GLUCOSE 305* 281* 213*  BUN 61* 61* 74*  CREATININE 2.50* 2.17* 2.09*  CALCIUM 8.8* 8.9 8.7*  MG  --  2.1  --    GFR: Estimated Creatinine Clearance: 43.3 mL/min (A) (by C-G formula based on SCr of 2.09 mg/dL (H)). Liver Function Tests: Recent Labs  Lab 10/05/20 1928 10/06/20 0435  AST 45* 36  ALT 68* 62*  ALKPHOS 115 107  BILITOT 1.5* 1.3*  PROT 8.2* 7.6  ALBUMIN 3.9 3.6   No results for input(s): LIPASE, AMYLASE in the last 168 hours. No results for input(s): AMMONIA in the last 168 hours. Coagulation Profile: Recent Labs  Lab 10/05/20 1928  INR 1.2   Cardiac Enzymes: No results for input(s): CKTOTAL, CKMB, CKMBINDEX, TROPONINI in the last 168 hours. BNP (last 3 results) No results for input(s): PROBNP in the last 8760 hours. HbA1C: Recent Labs    10/06/20 0435  HGBA1C 8.1*   CBG: Recent Labs  Lab 10/06/20 1132 10/06/20 1800 10/06/20 2155 10/07/20 0746 10/07/20 1120  GLUCAP 318* 135* 194* 187* 209*   Lipid Profile: Recent Labs    10/06/20 0435  CHOL 101  HDL 28*  LDLCALC 61  TRIG 62  CHOLHDL 3.6   Thyroid Function Tests: Recent Labs    10/06/20 0435  TSH 0.451   Anemia Panel: Recent Labs    10/06/20 0434 10/06/20 0435  VITAMINB12 126*  --   FOLATE  --  15.9  FERRITIN  --  16*  TIBC  --  364  IRON  --  21*   Sepsis Labs: No results for input(s): PROCALCITON, LATICACIDVEN in the last 168 hours.  Recent Results (from the past 240 hour(s))  Resp Panel by RT-PCR (Flu A&B, Covid) Nasopharyngeal Swab     Status: None   Collection Time: 10/05/20  9:01 PM   Specimen: Nasopharyngeal Swab; Nasopharyngeal(NP) swabs in vial transport medium  Result Value Ref Range Status    SARS Coronavirus 2 by RT PCR NEGATIVE NEGATIVE Final    Comment: (NOTE) SARS-CoV-2 target nucleic acids are NOT DETECTED.  The SARS-CoV-2 RNA is generally detectable in upper respiratory specimens during the acute phase of infection. The lowest concentration of SARS-CoV-2 viral copies this assay can detect is 138 copies/mL. A negative result does not preclude SARS-Cov-2 infection and should not be used as the sole basis for treatment or other patient management decisions. A negative result may occur with  improper specimen collection/handling, submission of specimen other than nasopharyngeal swab, presence of viral mutation(s) within the areas targeted by this assay, and inadequate number of viral copies(<138 copies/mL). A negative result must be combined with clinical observations, patient history, and epidemiological information. The expected result is Negative.  Fact Sheet for Patients:  BloggerCourse.com  Fact Sheet for Healthcare Providers:  SeriousBroker.it  This test is no t yet approved or cleared by the [place] FDA and  has been authorized for detection and/or diagnosis of SARS-CoV-2 by FDA under an Emergency Use Authorization (EUA). This EUA will remain  in effect (meaning this test can be used) for the duration of the COVID-19 declaration under Section 564(b)(1) of the Act, 21 U.S.C.section 360bbb-3(b)(1), unless the authorization is terminated  or revoked sooner.       Influenza A by PCR NEGATIVE NEGATIVE Final   Influenza B by PCR NEGATIVE NEGATIVE Final    Comment: (NOTE) The Xpert Xpress SARS-CoV-2/FLU/RSV plus assay is intended as an aid in the diagnosis of influenza from Nasopharyngeal swab specimens and should not be used as a sole basis for treatment. Nasal washings and aspirates are unacceptable for Xpert Xpress SARS-CoV-2/FLU/RSV testing.  Fact Sheet for  Patients: BloggerCourse.com  Fact Sheet for Healthcare Providers: SeriousBroker.it  This test is not yet approved or cleared by the [place] FDA and has been authorized for detection and/or diagnosis of SARS-CoV-2 by FDA under an Emergency Use Authorization (EUA). This EUA will remain in effect (meaning this test can be used) for the duration of the COVID-19 declaration under Section 564(b)(1) of the Act, 21 U.S.C. section 360bbb-3(b)(1), unless the authorization is terminated or revoked.  Performed at [facility], [address] [place], [place] [address]          Radiology Studies: NM Pulmonary Perfusion  Result Date: 10/07/2020 CLINICAL DATA:  Shortness of breath EXAM: NUCLEAR MEDICINE PERFUSION LUNG SCAN TECHNIQUE: Perfusion images were obtained in multiple projections after intravenous injection of radiopharmaceutical. Views: Anterior, posterior, left lateral, right lateral, RPO, LPO, RAO, LAO. RADIOPHARMACEUTICALS:  4.16 mCi Tc-93m MAA IV COMPARISON:  Chest radiograph October 07, 2020 FINDINGS: Radiotracer uptake is homogeneous and symmetric bilaterally. No perfusion defects evident.  IMPRESSION: No perfusion defects evident. No findings indicative of pulmonary embolus. Electronically Signed   By: Bretta BangWilliam  Woodruff III M.D.   On: 10/07/2020 15:03   US Venous Img Lower Bilateral  Result Date: 10/05/2020 CLINICAL DATA:  [age] year old male with leg swelling. EXAM: Bilateral LOWER EXTREMITY VENOUS DOPPLER ULTRASOUND TECHNIQUE: Gray-scale sonography with compression, as well as color and duplex ultrasound, were performed to evaluate the deep venous system(s) from the level of the common femoral vein through the popliteal and proximal calf veins. COMPARISON:  None. FINDINGS: VENOUS Normal compressibility of the common femoral, superficial femoral, and popliteal veins, as well as the visualized calf veins. Visualized  portions of profunda femoral vein and great saphenous vein unremarkable. No filling defects to suggest DVT on grayscale or color Doppler imaging. Doppler waveforms show normal direction of venous flow, normal respiratory plasticity and response to augmentation. Limited views of the contralateral common femoral vein are unremarkable. OTHER There is a 2.0 x 0.8 x 1.1 cm cystic structure in the left popliteal fossa, likely Baker's cyst. There is mild subcutaneous edema of the left lower extremity. Limitations: none IMPRESSION: 1. No sonographic findings of DVT. 2. 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 Electronically Signed   By: Elgie CollardArash  Radparvar M.D.   On: 10/05/2020 23:18   DG Chest Port 1 View  Result Date: 10/07/2020 CLINICAL DATA:  Shortness of breath, respiratory failure EXAM: PORTABLE CHEST 1 VIEW COMPARISON:  10/05/2020 FINDINGS: Mild cardiomegaly, stable. Mild pulmonary vascular congestion. Diffuse interstitial prominence has slightly improved compared to the previous study. No new focal airspace consolidation. No pleural effusion or pneumothorax is seen. IMPRESSION: Mild cardiomegaly with pulmonary vascular congestion. Diffuse interstitial prominence has slightly improved compared to the previous study. Electronically Signed   By: Duanne GuessNicholas  Plundo D.O.   On: 10/07/2020 12:49   DG Chest Portable 1 View  Result Date: 10/05/2020 CLINICAL DATA:  Shortness of breath. EXAM: PORTABLE CHEST 1 VIEW COMPARISON:  None. FINDINGS: Mild cardiomegaly is noted. No pneumothorax or pleural effusion is noted. Mild central pulmonary vascular congestion is noted. Mild interstitial densities are noted in the perihilar and basilar regions concerning for pulmonary edema. Bony thorax is unremarkable. IMPRESSION: Mild cardiomegaly with central pulmonary vascular congestion and probable mild bilateral pulmonary edema. Electronically Signed   By: Lupita RaiderJames  Green Jr M.D.   On: 10/05/2020 19:49   ECHOCARDIOGRAM COMPLETE  Result Date:  10/07/2020    ECHOCARDIOGRAM REPORT   Patient Name:   Lenox PondsWILLIAM Kristiansen Date of Exam: 10/06/2020 Medical Rec #:  [id number]      Height:       72.0 in Accession #:    [id number]     Weight:       236.0 lb Date of Birth:  02/03/1951       BSA:          2.286 m Patient Age:    69 years       BP:           144/69 mmHg Patient Gender: M              HR:           123 bpm. Exam Location:  [facility] Procedure: 2D Echo, Cardiac Doppler and Color Doppler Indications:     R06.00 Dyspnea  History:         Patient has no prior history of Echocardiogram examinations.                  Risk Factors:Hypertension, Dyslipidemia and Diabetes.  Chronic                  kidney disease.  Sonographer:     [place] Small Rodgers-Jones Referring Phys:  [id number] Floreen Comber PATEL Diagnosing Phys: Yvonne Kendall MD IMPRESSIONS  1. Left ventricular ejection fraction, by estimation, is 55 to 60%. The left ventricle has normal function. The left ventricle has no regional wall motion abnormalities. There is mild left ventricular hypertrophy. Left ventricular diastolic parameters are indeterminate.  2. Right ventricular systolic function is moderately reduced. The right ventricular size is mildly enlarged. There is mildly elevated pulmonary artery systolic pressure.  3. Left atrial size was mildly dilated.  4. Right atrial size was mildly dilated.  5. A small pericardial effusion is present.  6. The mitral valve is normal in structure. Mild mitral valve regurgitation. No evidence of mitral stenosis.  7. Tricuspid valve regurgitation is mild to moderate.  8. The aortic valve is tricuspid. Aortic valve regurgitation is not visualized. No aortic stenosis is present.  9. The inferior vena cava is dilated in size with <50% respiratory variability, suggesting right atrial pressure of 15 mmHg. FINDINGS  Left Ventricle: Left ventricular ejection fraction, by estimation, is 55 to 60%. The left ventricle has normal function. The left ventricle has no regional wall motion  abnormalities. The left ventricular internal cavity size was normal in size. There is  mild left ventricular hypertrophy. Left ventricular diastolic parameters are indeterminate. Right Ventricle: The right ventricular size is mildly enlarged. No increase in right ventricular wall thickness. Right ventricular systolic function is moderately reduced. There is mildly elevated pulmonary artery systolic pressure. The tricuspid regurgitant velocity is 2.31 m/s, and with an assumed right atrial pressure of 15 mmHg, the estimated right ventricular systolic pressure is 36.3 mmHg. Left Atrium: Left atrial size was mildly dilated. Right Atrium: Right atrial size was mildly dilated. Pericardium: A small pericardial effusion is present. Mitral Valve: The mitral valve is normal in structure. Mild mitral valve regurgitation. No evidence of mitral valve stenosis. Tricuspid Valve: The tricuspid valve is normal in structure. Tricuspid valve regurgitation is mild to moderate. Aortic Valve: The aortic valve is tricuspid. Aortic valve regurgitation is not visualized. No aortic stenosis is present. Pulmonic Valve: The pulmonic valve was not well visualized. Pulmonic valve regurgitation is not visualized. No evidence of pulmonic stenosis. Aorta: The aortic root is normal in size and structure. Venous: The inferior vena cava is dilated in size with less than 50% respiratory variability, suggesting right atrial pressure of 15 mmHg. IAS/Shunts: The interatrial septum was not well visualized.  TRICUSPID VALVE TR Peak grad:   21.3 mmHg TR Vmax:        231.00 cm/s Yvonne Kendall MD Electronically signed by Yvonne Kendall MD Signature Date/Time: 10/07/2020/7:03:35 AM    Final         Scheduled Meds: . amLODipine  10 mg Oral Daily  . aspirin EC  81 mg Oral Daily  . atorvastatin  40 mg Oral Daily  . carvedilol  25 mg Oral BID WC  . ferrous sulfate  325 mg Oral BID WC  . furosemide  40 mg Intravenous Q12H  . hydrALAZINE  50 mg Oral  BID  . insulin aspart  0-15 Units Subcutaneous TID WC  . insulin aspart  0-5 Units Subcutaneous QHS  . insulin detemir  20 Units Subcutaneous Daily  . patiromer  16.8 g Oral Daily  . [START ON 10/08/2020] pneumococcal 23 valent vaccine  0.5 mL Intramuscular Tomorrow-1000  .  vitamin B-12  1,000 mcg Oral Daily   Continuous Infusions: . heparin 1,700 Units/hr (10/07/20 0317)     LOS: 2 days    Time spent: 35 minutes    Tresa Moore, MD [facility] Pager [phone]  If 7PM-7AM, please contact night-coverage 10/07/2020, 3:37 PM

## 2020-10-07 NOTE — Progress Notes (Signed)
Progress Note  Patient Name: Anthony Fox Date of Encounter: 10/07/2020  Primary Cardiologist: New to [facility] - consult by End  Subjective   Dyspnea improving. No chest pain or palpitations. Remains on supplemental oxygen. Since being on telemetry on 2A, he has been in rate-controlled Afib. Documented UOP 215 mL for the past 24 hours with a net - 3 L for the admission. Weight 107.5-->106.1 kg. SCr 2.17-->2.09. Potassium 5.5-->4.8. Echo showed an EF of 55-60%, no RWMA, mild LVH, moderately reduced RVSF with a mildly enlarged RV cavity size, mildly elevated RVSP,mild biatrial enlargement, mild MR, mild to moderate TR, and a right atrial pressure of 15.   Inpatient Medications    Scheduled Meds: . amLODipine  10 mg Oral Daily  . aspirin EC  81 mg Oral Daily  . atorvastatin  40 mg Oral Daily  . carvedilol  25 mg Oral BID WC  . furosemide  40 mg Intravenous Q12H  . hydrALAZINE  50 mg Oral BID  . insulin aspart  0-15 Units Subcutaneous TID WC  . insulin aspart  0-5 Units Subcutaneous QHS  . insulin detemir  20 Units Subcutaneous Daily  . patiromer  16.8 g Oral Daily  . [START ON 10/08/2020] pneumococcal 23 valent vaccine  0.5 mL Intramuscular Tomorrow-1000   Continuous Infusions: . heparin 1,700 Units/hr (10/07/20 0317)   PRN Meds: acetaminophen **OR** acetaminophen, ondansetron **OR** ondansetron (ZOFRAN) IV   Vital Signs    Vitals:   10/07/20 0040 10/07/20 0251 10/07/20 0313 10/07/20 0745  BP: 130/70 (!) 118/108 139/81 140/86  Pulse: 83 80 78 96  Resp: 17 17 18    Temp: 98 F (36.7 C) 98 F (36.7 C) 98.6 F (37 C) 98.6 F (37 C)  TempSrc: Oral Oral Oral Oral  SpO2: 95% 91% 98% 93%  Weight:   106.1 kg   Height:        Intake/Output Summary (Last 24 hours) at 10/07/2020 0939 Last data filed at 10/07/2020 0820 Gross per 24 hour  Intake 384.38 ml  Output 1200 ml  Net -815.62 ml   Filed Weights   10/05/20 1849 10/06/20 0545 10/07/20 0313  Weight: 100 kg 107.5 kg  106.1 kg    Telemetry    Afib with controlled ventricular response - Personally Reviewed  ECG    No new tracings - Personally Reviewed  Physical Exam   GEN: No acute distress.   Neck: JVD ~ 8 cm. Cardiac: IRIR, no murmurs, rubs, or gallops.  Respiratory: Faint bibasilar crackles.  GI: Soft, nontender, non-distended.   MS: No edema; No deformity. Neuro:  Alert and oriented x 3; Nonfocal.  Psych: Normal affect.  Labs    Chemistry Recent Labs  Lab 10/05/20 1928 10/06/20 0435 10/07/20 0518  NA 139 141 140  K 5.7* 5.5* 4.8  CL 109 110 110  CO2 20* 21* 21*  GLUCOSE 305* 281* 213*  BUN 61* 61* 74*  CREATININE 2.50* 2.17* 2.09*  CALCIUM 8.8* 8.9 8.7*  PROT 8.2* 7.6  --   ALBUMIN 3.9 3.6  --   AST 45* 36  --   ALT 68* 62*  --   ALKPHOS 115 107  --   BILITOT 1.5* 1.3*  --   GFRNONAA 27* 32* 34*  ANIONGAP 10 10 9      Hematology Recent Labs  Lab 10/05/20 1928 10/06/20 0435 10/07/20 0518  WBC 8.7 8.8 11.3*  RBC 3.46* 3.37* 3.16*  HGB 8.8* 8.9* 8.0*  HCT 30.3* 28.7* 27.2*  MCV  87.6 85.2 86.1  MCH 25.4* 26.4 25.3*  MCHC 29.0* 31.0 29.4*  RDW 15.3 15.2 15.0  PLT 221 196 212    Cardiac EnzymesNo results for input(s): TROPONINI in the last 168 hours. No results for input(s): TROPIPOC in the last 168 hours.   BNP Recent Labs  Lab 10/05/20 1928  BNP 1,365.1*     DDimer No results for input(s): DDIMER in the last 168 hours.   Radiology    US Venous Img Lower Bilateral  Result Date: 10/05/2020 IMPRESSION: 1. No sonographic findings of DVT. 2. 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 Electronically Signed   By: Elgie Collard M.D.   On: 10/05/2020 23:18   DG Chest Portable 1 View  Result Date: 10/05/2020 IMPRESSION: Mild cardiomegaly with central pulmonary vascular congestion and probable mild bilateral pulmonary edema. Electronically Signed   By: Lupita Raider M.D.   On: 10/05/2020 19:49   Cardiac Studies   2D echo 10/06/2020: 1. Left ventricular ejection  fraction, by estimation, is 55 to 60%. The  left ventricle has normal function. The left ventricle has no regional  wall motion abnormalities. There is mild left ventricular hypertrophy.  Left ventricular diastolic parameters  are indeterminate.  2. Right ventricular systolic function is moderately reduced. The right  ventricular size is mildly enlarged. There is mildly elevated pulmonary  artery systolic pressure.  3. Left atrial size was mildly dilated.  4. Right atrial size was mildly dilated.  5. A small pericardial effusion is present.  6. The mitral valve is normal in structure. Mild mitral valve  regurgitation. No evidence of mitral stenosis.  7. Tricuspid valve regurgitation is mild to moderate.  8. The aortic valve is tricuspid. Aortic valve regurgitation is not  visualized. No aortic stenosis is present.  9. The inferior vena cava is dilated in size with <50% respiratory  variability, suggesting right atrial pressure of 15 mmHg.  Patient Profile     69 y.o. 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 Allena Katz.  Assessment & Plan    1. Acute hypoxic respiratory distress/acute HFrEF/elevated D-dimer: -Type unknown, with pending echo -Respiratory status is improved though he does remain volume overloaded -Check echo with recommendations for ischemic evaluation and escalation of evidence-based medical therapy pending these results -Continue IV Lasix 40 mg twice daily and carvedilol -No a candidate for spironolactone with acute on CKD and history of recurrent  hyperkalemia  -Ischemic evaluation will be needed as outlined below following improvement in his respiratory status with diuresis  -He was noted to have an elevated D-dimer with associated hypoxia, sinus tachycardia, and tachypnea.  Echo shows evidence of RV dysfunction along with a dilated RV cavity. Recommend further evaluation of this per internal medicine.  CTA chest is not ideal given his underlying renal dysfunction, therefore VQ scan should be considered -Continue to wean supplemental oxygen as able per primary service -CHF education -Daily weights -Strict I's and O's  2.  Elevated troponin: -Never with chest pain though with significant exertional dyspnea in a patient with multiple comorbid conditions including insulin-dependent diabetes mellitus, prior tobacco use, hypertension, hyperlipidemia,  male sex, and obesity -Troponin peaked at approximately 1200 and is currently downtrending -ASA -Heparin drip for now to complete 48-72 hours of medical therapy -Echo as above -Consider further evaluation with VQ scan per IM -He would need an ischemic evaluation, possibly with a Lexiscan MPI down the road once his volume status is improved if PE is excluded by primary service  -Not currently a cath candidate with acute on CKD -LDL 61  3.  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  Insulin-dependent diabetes mellitus: -A1c 8.1 this admission -SSI per IM  5.  Anemia of chronic disease/iron deficiency: -Hgb mostly stable -Monitor on heparin drip  6.  PAF: -[facility] note indicates patient has a history of PAF and has been managed on Plavix -Review of [facility] and Care Everywhere showed no objective evidence of A. Fib at time of consult and he was in sinus rhythm while in the ED -This morning, on telemetry, he is in rate-controlled  Afib -Asymptomatic  -CHADS2VASc 5 (CHF, HTN, age, DM, vascular disease) -Continue heparin gtt as above with recommendation to transition to [facility] prior to discharge given his stroke risk  -Given his underlying PAD, he will likely need dual therapy with Eliquis and Plavix  -Echo as above -TSH normal -Potassium and magnesium at goal  7.  PAD: -Followed by [facility] vascular surgery -Plavix on hold while he is on heparin per internal medicine -He remains on atorvastatin  8.  HTN: -Blood pressure improving -Continue current medical therapy including carvedilol, amlodipine, hydralazine and IV Lasix -Lisinopril held with AKI and hyperkalemia  -Chlorthalidone held in the setting of IV Lasix use  9.  HLD: -LDL 61 this admission  -PTA atorvastatin   For questions or updates, please contact [facility] Please consult [email or web address] for contact info under Cardiology/STEMI.    Signed, Eula Listen, PA-C [facility] Pager: [phone] 10/07/2020, 9:39 AM

## 2020-10-07 NOTE — Consult Note (Signed)
ANTICOAGULATION CONSULT NOTE  Pharmacy Consult for Heparin Indication: chest pain/ACS  Allergies  Allergen Reactions  . Minocycline Rash    Patient Measurements: Height: 6\' 1"  (185.4 cm) Weight: 107.5 kg (236 lb 15.9 oz) IBW/kg (Calculated) : 79.9 Heparin Dosing Weight: 99.9 kg  Vital Signs: Temp: 98 F (36.7 C) (12/15 0040) Temp Source: Oral (12/15 0040) BP: 130/70 (12/15 0040) Pulse Rate: 83 (12/15 0040)  Labs: Recent Labs    10/05/20 1928 10/05/20 2212 10/06/20 0027 10/06/20 0435 10/06/20 0435 10/06/20 1200 10/06/20 2000 10/07/20 0200  HGB 8.8*  --   --  8.9*  --   --   --   --   HCT 30.3*  --   --  28.7*  --   --   --   --   PLT 221  --   --  196  --   --   --   --   APTT 34  --   --   --   --   --   --   --   LABPROT 14.9  --   --   --   --   --   --   --   INR 1.2  --   --   --   --   --   --   --   HEPARINUNFRC  --   --   --  0.28*   < > 0.29* 0.52 0.51  CREATININE 2.50*  --   --  2.17*  --   --   --   --   TROPONINIHS 466* 1,230* 903*  --   --   --   --   --    < > = values in this interval not displayed.    Estimated Creatinine Clearance: 41.9 mL/min (A) (by C-G formula based on SCr of 2.17 mg/dL (H)).   Medical History: Past Medical History:  Diagnosis Date  . Anemia due to chronic kidney disease   . CKD (chronic kidney disease), stage III (HCC)   . Hyperlipidemia associated with type 2 diabetes mellitus (HCC)   . Hypertension associated with diabetes (HCC)   . Insulin dependent type 2 diabetes mellitus (HCC)   . PAD (peripheral artery disease) (HCC)     Medications:  (Not in a hospital admission)  Scheduled:  Infusions:  PRN:  Anti-infectives (From admission, onward)   None      Assessment: Heparin Dosing Weight: 99.9 kg Pharmacy consulted for heparin for ACS. Trop elevated. No note of DOAC PTA.   Goal of Therapy:  Heparin level 0.3-0.7 units/ml Monitor platelets by anticoagulation protocol: Yes   12/14 0435 0.28,  subtherapeutic 12/14 1200 0.29, subtherapeutic 12/14 2000 0.52, therapeutic.  12/15 0200 0.51, therapeutic   Plan:  Heparin level is therapeutic. Will continue heparin infusion at 1700 units/hr.  Will check anti-Xa daily with AM labs while therapeutic. CBC daily while on heparin.   1/16, PharmD, [facility] 10/07/2020 2:39 AM

## 2020-10-07 NOTE — TOC Initial Note (Signed)
Transition of Care [facility] - Initial/Assessment Note    Patient Details  Name: Anthony Fox MRN: [id number] Date of Birth: [birth year]  Transition of [facility]) CM/SW Contact:    Marina Goodell Phone Number: [phone] 10/07/2020, 9:28 AM  Clinical Narrative:                  Patient presents to [facility] due to SOB.  CSW explained to patient and Larsen, Dungan (Spouse)  [phone] TOC role in patient care.  Patient lives at home with wife Carney Bern, she is his primary care taker.  Patient is able to complete all ADLs without assistance, patient stated "although I've been slower because I get winded lately." Patient stated his SOB started "about 6 months ago." Patient stated he uses a cane to ambulate.  Patient stated he is able to drive himself to doctor's appointments, receive his medication and take his medication without any issues. Patient is not on chronic O2 at home.  Patient stated he did not want placement in a SNF but wanted home health. CSW gave patient and spouse Medicare.gov website information.  Expected Discharge Plan: Skilled Nursing Facility Barriers to Discharge: Continued Medical Work up   Patient Goals and CMS Choice Patient states their goals for this hospitalization and ongoing recovery are:: "I would like to feel better and figure out what is making me so short of breath." CMS Medicare.gov Compare Post Acute Care list provided to:: Patient Represenative (must comment) Silvestre, [place] (Spouse)   [phone]) Choice offered to / list presented to : Spouse  Expected Discharge Plan and Services Expected Discharge Plan: Skilled Nursing Facility In-house Referral: Clinical Social Work   Post Acute Care Choice: Home Health Living arrangements for the past 2 months: Single Family Home                                      Prior Living Arrangements/Services Living arrangements for the past 2 months: Single Family Home Lives with:: Spouse Patient language and need  for interpreter reviewed:: Yes Do you feel safe going back to the place where you live?: Yes      Need for Family Participation in Patient Care: Yes (Comment) Care giver support system in place?: Yes (comment)   Criminal Activity/Legal Involvement Pertinent to Current Situation/Hospitalization: No - Comment as needed  Activities of Daily Living Home Assistive Devices/Equipment: Dentures (specify type),Cane (specify quad or straight) ADL Screening (condition at time of admission) Patient's cognitive ability adequate to safely complete daily activities?: Yes Is the patient deaf or have difficulty hearing?: No Does the patient have difficulty seeing, even when wearing glasses/contacts?: No Does the patient have difficulty concentrating, remembering, or making decisions?: No Patient able to express need for assistance with ADLs?: Yes Does the patient have difficulty dressing or bathing?: No Independently performs ADLs?: Yes (appropriate for developmental age) Does the patient have difficulty walking or climbing stairs?: Yes Weakness of Legs: Both Weakness of Arms/Hands: None  Permission Sought/Granted Permission sought to share information with : Family Supports Miles, Leyda (Spouse)   [phone]) Permission granted to share information with : Yes, Verbal Permission Granted  Share Information with NAME: Jacobe, Study (Spouse)   [phone]           Emotional Assessment Appearance:: Appears stated age Attitude/Demeanor/Rapport: Engaged Affect (typically observed): Stable Orientation: : Oriented to Self,Oriented to Place,Oriented to  Time,Oriented to Situation Alcohol / Substance Use: Not Applicable Psych Involvement: No (  comment)  Admission diagnosis:  Acute respiratory failure with hypoxia (HCC) [J96.01] Acute congestive heart failure, unspecified heart failure type [facility]) [I50.9] Patient Active Problem List   Diagnosis Date Noted  . Acute respiratory failure with hypoxia (HCC)  10/05/2020  . CKD (chronic kidney disease) stage 3, GFR 30-59 ml/min (HCC) 10/05/2020  . Hyperkalemia 10/05/2020  . Elevated troponin 10/05/2020  . Acute CHF (congestive heart failure) (HCC) 10/05/2020  . Hyperlipidemia associated with type 2 diabetes mellitus (HCC) 10/05/2020  . Acute renal failure superimposed on stage 3 chronic kidney disease (HCC) 10/05/2020  . Paroxysmal atrial fibrillation (HCC) 05/08/2014  . DM (diabetes mellitus) type II uncontrolled, periph vascular disorder (HCC) 08/14/2013  . Anemia of chronic kidney failure, stage 3 (moderate) (HCC) 06/11/2013  . PAD (peripheral artery disease) (HCC) 04/17/2013  . Hypertension associated with diabetes (HCC) 12/20/2010   PCP:  Patient, No Pcp Per Pharmacy:  No Pharmacies Listed    Social Determinants of Health (SDOH) Interventions    Readmission Risk Interventions No flowsheet data found.

## 2020-10-07 NOTE — Consult Note (Addendum)
   Heart Failure Nurse Navigator Note  HFpEF 55-60% LVH.  Right ventricular systolic function moderately reduced.  Mild biatrial enlargement.  The moderate tricuspid regurgitation.  He presented with 3 to 37-month history of progression of shortness of breath, chest tightness, lower extremity edema and abdominal swelling.  Comorbidities:  Insulin  dependent diabetes Paroxysmal atrial fibrillation Chronic kidney disease stage III Hypertension Hyperlipidemia Anemia  Medications:  Amlodipine 10 mg daily Aspirin 81 mg daily Lipitor 40 mg daily Coreg 25 mg twice a day Furosemide 40 mg IV every 12 Hydralazine 50 mg 2 times a day   Labs:  Sodium 140, potassium 4.8, chloride 110, CO2 21, BUN 74, creatinine 2.09, BNP on admission 1365.  Weight 106.1 kg down from 107.5 of yesterday.  Intake 384 mL Output 600 mL Blood pressure 121/57 BMI 30.87  Assessment:  General-he is awake and alert lying in bed in no acute distress.  HEENT-pupils are equal, normocephalic, nonicteric  Cardiac-heart tones S1-S2 regular rate and rhythm.  Chest-breath sounds are clear to posterior auscultation.  Abdomen-rounded soft nontender.  Lower extremities-there is no edema noted     Initial visit with patient.  States that home that is he is the one that cooks breakfast and he likes to have bacon eggs.  States that he does not usually eat lunch but for supper they will fix fish chicken or pork chop.  He states if they used canned vegetables that they rinse them very well and then cook them in water.  He does not use salt at the table.  Does not weigh himself daily, he does have a scale and we discussed getting into that routine of weighing daily and recording.   Was given the heart failure teaching booklet, zone magnet and the holiday flyer.  Continue to follow along.   Tresa Endo RN, [facility]

## 2020-10-08 DIAGNOSIS — E1169 Type 2 diabetes mellitus with other specified complication: Secondary | ICD-10-CM

## 2020-10-08 DIAGNOSIS — N183 Chronic kidney disease, stage 3 unspecified: Secondary | ICD-10-CM

## 2020-10-08 DIAGNOSIS — E785 Hyperlipidemia, unspecified: Secondary | ICD-10-CM

## 2020-10-08 DIAGNOSIS — R778 Other specified abnormalities of plasma proteins: Secondary | ICD-10-CM

## 2020-10-08 DIAGNOSIS — N1832 Chronic kidney disease, stage 3b: Secondary | ICD-10-CM

## 2020-10-08 DIAGNOSIS — E1159 Type 2 diabetes mellitus with other circulatory complications: Secondary | ICD-10-CM

## 2020-10-08 DIAGNOSIS — I152 Hypertension secondary to endocrine disorders: Secondary | ICD-10-CM

## 2020-10-08 DIAGNOSIS — D631 Anemia in chronic kidney disease: Secondary | ICD-10-CM

## 2020-10-08 LAB — GLUCOSE, CAPILLARY
Glucose-Capillary: 180 mg/dL — ABNORMAL HIGH (ref 70–99)
Glucose-Capillary: 248 mg/dL — ABNORMAL HIGH (ref 70–99)
Glucose-Capillary: 232 mg/dL — ABNORMAL HIGH (ref 70–99)
Glucose-Capillary: 210 mg/dL — ABNORMAL HIGH (ref 70–99)

## 2020-10-08 LAB — BASIC METABOLIC PANEL
Anion gap: 9 (ref 5–15)
BUN: 83 mg/dL — ABNORMAL HIGH (ref 8–23)
CO2: 20 mmol/L — ABNORMAL LOW (ref 22–32)
Calcium: 8.5 mg/dL — ABNORMAL LOW (ref 8.9–10.3)
Chloride: 105 mmol/L (ref 98–111)
Creatinine, Ser: 2.13 mg/dL — ABNORMAL HIGH (ref 0.61–1.24)
GFR, Estimated: 33 mL/min — ABNORMAL LOW (ref >60)
Glucose, Bld: 238 mg/dL — ABNORMAL HIGH (ref 70–99)
Potassium: 4 mmol/L (ref 3.5–5.1)
Sodium: 134 mmol/L — ABNORMAL LOW (ref 135–145)

## 2020-10-08 LAB — CBC
HCT: 27.9 % — ABNORMAL LOW (ref 39.0–52.0)
Hemoglobin: 8.4 g/dL — ABNORMAL LOW (ref 13.0–17.0)
MCH: 25.4 pg — ABNORMAL LOW (ref 26.0–34.0)
MCHC: 30.1 g/dL (ref 30.0–36.0)
MCV: 84.3 fL (ref 80.0–100.0)
Platelets: 228 10*3/uL (ref 150–400)
RBC: 3.31 MIL/uL — ABNORMAL LOW (ref 4.22–5.81)
RDW: 14.8 % (ref 11.5–15.5)
WBC: 8.1 10*3/uL (ref 4.0–10.5)
nRBC: 1.1 % — ABNORMAL HIGH (ref 0.0–0.2)

## 2020-10-08 LAB — HEPARIN LEVEL (UNFRACTIONATED): Heparin Unfractionated: 0.39 IU/mL (ref 0.30–0.70)

## 2020-10-08 MED ORDER — FUROSEMIDE 10 MG/ML IJ SOLN
40.0000 mg | Freq: Every day | INTRAMUSCULAR | Status: DC
  Administered 2020-10-09: 14:00:00 40 mg via INTRAVENOUS
  Filled 2020-10-08: qty 4

## 2020-10-08 NOTE — Progress Notes (Signed)
PROGRESS NOTE    Anthony Fox  EYC:[id number] DOB: [birth year] DOA: 10/05/2020 PCP: Patient, No Pcp Per   Brief Narrative:  69 y.o.malewith medical history significant forinsulin-dependent type 2 diabetes, PAD (s/p right femoropopliteal angioplasty with drug-coated balloon to 12/06/2019), PAF on aspirin/Plavix, CKD stage III, hypertension, hyperlipidemia, recurrent hyperkalemia, anemia of chronic kidney disease who presents to the ED for evaluation of shortness of breath.Majority of care is through [facility] all prior records in Care Everywhere.  Patient states he has been having 3 or 4 months of progressive shortness of breath. He has been having notable orthopnea, dyspnea on exertion, paroxysmal nocturnal dyspnea, and subjective weight gain. Symptoms significantly worsened over the last few days and eventually called EMS  12/15: Patient reports improvement in respiratory symptoms.  Net -3 L since admission.  Diuresing well.  12/16: Dyspnea consistent.  Some improvement in wheezing.  Remains on supplemental oxygen.  Net -4.8 L since admission.  VQ scan low probability for pulmonary embolism   Assessment & Plan:   Principal Problem:   Acute respiratory failure with hypoxia (HCC) Active Problems:   DM (diabetes mellitus) type II uncontrolled, periph vascular disorder (HCC)   PAD (peripheral artery disease) (HCC)   Hypertension associated with diabetes (HCC)   Anemia of chronic kidney failure, stage 3 (moderate) (HCC)   Paroxysmal atrial fibrillation (HCC)   CKD (chronic kidney disease) stage 3, GFR 30-59 ml/min (HCC)   Hyperkalemia   Elevated troponin   Acute CHF (congestive heart failure) (HCC)   Hyperlipidemia associated with type 2 diabetes mellitus (HCC)   Acute renal failure superimposed on stage 3 chronic kidney disease (HCC)  Acute on chronic decompensated diastolic congestive heart failure Acute respiratory failure secondary to above BNP elevated patient required BiPAP on  admission Has improved substantially Net -4.8 L Recorded weights show an increase of 3 Kg, question accuracy Slight increase in BUN/creatinine Plan: Lasix 40 mg IV daily Continue carvedilol Wean oxygen as tolerated Appreciate cardiology recommendations  Elevated troponin Unable to rule out ACS/NSTEMI Troponin peaked at 1230 Cardiology on board Patient on heparin drip Plan: Continue heparin infusion for now, plan for 48-72 hours Plan for transition to DOAC on discharge No plans for inpatient catheterization Consideration for ischemic work-up as outpatient  Paroxysmal atrial fibrillation CHA2DS2-VASc 5 Heart rate currently controlled On heparin gtt. Plan: Continue Coreg Continue heparin GTT Switch to Eliquis on discharge  Peripheral arterial disease Currently on Lipitor, will continue Continue aspirin Continue heparin GTT Plan to switch to Plavix and Eliquis on discharge  Acute kidney injury on chronic kidney disease stage IIIa Suspect cardiorenal syndrome secondary to decompensated congestive heart failure Creatinine improving over interval Continue diuresis as above Avoid nephrotoxins Daily BMP  Hyperkalemia Resolved status post Veltassa Continue IV diuresis Hold ACE inhibitor Daily BMP  Insulin-dependent type 2 diabetes mellitus with hyperglycemia Hemoglobin A1c 8.1 Not on any diabetic medications at home Continue Levemir 20 units and SSI  Essential hypertension Adequate control over interval Continue Coreg, amlodipine, Lasix Holding lisinopril chlorthalidone  Hyperlipidemia Continue atorvastatin  Anemia of chronic kidney disease Iron deficiency anemia Goal hemoglobin greater than 8 Patient had hemoglobin 8.8 on admission, decreased from previous 12 on 07/16/2020 No signs of bleeding  Elevated D-dimer Unclear etiology VQ scan low probability for pulmonary embolism Bilateral lower extremity duplex negative CTA contraindicated in the setting of  AKI Plan: Vitals per unit protocolVitals per unit protocol    DVT prophylaxis: Heparin GTT Code Status: Full Family Communication: None today Disposition Plan: Status is:  Inpatient  Remains inpatient appropriate because:Inpatient level of care appropriate due to severity of illness   Dispo: The patient is from: Home              Anticipated d/c is to: Home              Anticipated d/c date is: 2 days              Patient currently is not medically stable to d/c.   Continues to require intravenous diuretics for decompensated heart failure/fluid overload.  Anticipate 48 hours before disposition planning.  Anticipate discharge back home.      Consultants:   Cardiology-[facility]  Procedures:   None  Antimicrobials:   None   Subjective: Patient seen and examined.  Dyspnea consistent over interval.  Wheezing improved  Objective: Vitals:   10/08/20 0915 10/08/20 1206 10/08/20 1250 10/08/20 1254  BP:  126/65    Pulse:  68    Resp:  16    Temp:  98.8 F (37.1 C)    TempSrc:  Oral    SpO2: 99% 99% (!) 88% 95%  Weight:      Height:        Intake/Output Summary (Last 24 hours) at 10/08/2020 1435 Last data filed at 10/08/2020 1345 Gross per 24 hour  Intake 1119.17 ml  Output 2551 ml  Net -1431.83 ml   Filed Weights   10/06/20 0545 10/07/20 0313 10/08/20 0212  Weight: 107.5 kg 106.1 kg 109.4 kg    Examination:  General exam: Appears calm and comfortable  Respiratory system: Mild bibasilar crackles.  Normal work of breathing.  4 L Cardiovascular system: S1 & S2 heard, RRR. No JVD, murmurs, rubs, gallops or clicks.  2+ pitting edema bilaterally Gastrointestinal system: Abdomen is nondistended, soft and nontender. No organomegaly or masses felt. Normal bowel sounds heard. Central nervous system: Alert and oriented. No focal neurological deficits. Extremities: Symmetric 5 x 5 power. Skin: No rashes, lesions or ulcers Psychiatry: Judgement and insight appear  normal. Mood & affect appropriate.     Data Reviewed: I have personally reviewed following labs and imaging studies  CBC: Recent Labs  Lab 10/05/20 1928 10/06/20 0435 10/07/20 0518 10/08/20 0434  WBC 8.7 8.8 11.3* 8.1  NEUTROABS 7.5  --  9.2*  --   HGB 8.8* 8.9* 8.0* 8.4*  HCT 30.3* 28.7* 27.2* 27.9*  MCV 87.6 85.2 86.1 84.3  PLT 221 196 212 228   Basic Metabolic Panel: Recent Labs  Lab 10/05/20 1928 10/06/20 0435 10/07/20 0518 10/08/20 0434  NA 139 141 140 134*  K 5.7* 5.5* 4.8 4.0  CL 109 110 110 105  CO2 20* 21* 21* 20*  GLUCOSE 305* 281* 213* 238*  BUN 61* 61* 74* 83*  CREATININE 2.50* 2.17* 2.09* 2.13*  CALCIUM 8.8* 8.9 8.7* 8.5*  MG  --  2.1  --   --    GFR: Estimated Creatinine Clearance: 43.1 mL/min (A) (by C-G formula based on SCr of 2.13 mg/dL (H)). Liver Function Tests: Recent Labs  Lab 10/05/20 1928 10/06/20 0435  AST 45* 36  ALT 68* 62*  ALKPHOS 115 107  BILITOT 1.5* 1.3*  PROT 8.2* 7.6  ALBUMIN 3.9 3.6   No results for input(s): LIPASE, AMYLASE in the last 168 hours. No results for input(s): AMMONIA in the last 168 hours. Coagulation Profile: Recent Labs  Lab 10/05/20 1928  INR 1.2   Cardiac Enzymes: No results for input(s): CKTOTAL, CKMB, CKMBINDEX, TROPONINI in the last  168 hours. BNP (last 3 results) No results for input(s): PROBNP in the last 8760 hours. HbA1C: Recent Labs    10/06/20 0435  HGBA1C 8.1*   CBG: Recent Labs  Lab 10/07/20 1120 10/07/20 1632 10/07/20 2102 10/08/20 0814 10/08/20 1206  GLUCAP 209* 196* 197* 180* 248*   Lipid Profile: Recent Labs    10/06/20 0435  CHOL 101  HDL 28*  LDLCALC 61  TRIG 62  CHOLHDL 3.6   Thyroid Function Tests: Recent Labs    10/06/20 0435  TSH 0.451   Anemia Panel: Recent Labs    10/06/20 0434 10/06/20 0435  VITAMINB12 126*  --   FOLATE  --  15.9  FERRITIN  --  16*  TIBC  --  364  IRON  --  21*   Sepsis Labs: No results for input(s): PROCALCITON,  LATICACIDVEN in the last 168 hours.  Recent Results (from the past 240 hour(s))  Resp Panel by RT-PCR (Flu A&B, Covid) Nasopharyngeal Swab     Status: None   Collection Time: 10/05/20  9:01 PM   Specimen: Nasopharyngeal Swab; Nasopharyngeal(NP) swabs in vial transport medium  Result Value Ref Range Status   SARS Coronavirus 2 by RT PCR NEGATIVE NEGATIVE Final    Comment: (NOTE) SARS-CoV-2 target nucleic acids are NOT DETECTED.  The SARS-CoV-2 RNA is generally detectable in upper respiratory specimens during the acute phase of infection. The lowest concentration of SARS-CoV-2 viral copies this assay can detect is 138 copies/mL. A negative result does not preclude SARS-Cov-2 infection and should not be used as the sole basis for treatment or other patient management decisions. A negative result may occur with  improper specimen collection/handling, submission of specimen other than nasopharyngeal swab, presence of viral mutation(s) within the areas targeted by this assay, and inadequate number of viral copies(<138 copies/mL). A negative result must be combined with clinical observations, patient history, and epidemiological information. The expected result is Negative.  Fact Sheet for Patients:  BloggerCourse.comhttps://www.fda.gov/media/152166/download  Fact Sheet for Healthcare Providers:  SeriousBroker.ithttps://www.fda.gov/media/152162/download  This test is no t yet approved or cleared by the [place] FDA and  has been authorized for detection and/or diagnosis of SARS-CoV-2 by FDA under an Emergency Use Authorization (EUA). This EUA will remain  in effect (meaning this test can be used) for the duration of the COVID-19 declaration under Section 564(b)(1) of the Act, 21 U.S.C.section 360bbb-3(b)(1), unless the authorization is terminated  or revoked sooner.       Influenza A by PCR NEGATIVE NEGATIVE Final   Influenza B by PCR NEGATIVE NEGATIVE Final    Comment: (NOTE) The Xpert Xpress  SARS-CoV-2/FLU/RSV plus assay is intended as an aid in the diagnosis of influenza from Nasopharyngeal swab specimens and should not be used as a sole basis for treatment. Nasal washings and aspirates are unacceptable for Xpert Xpress SARS-CoV-2/FLU/RSV testing.  Fact Sheet for Patients: BloggerCourse.comhttps://www.fda.gov/media/152166/download  Fact Sheet for Healthcare Providers: SeriousBroker.ithttps://www.fda.gov/media/152162/download  This test is not yet approved or cleared by the [place] FDA and has been authorized for detection and/or diagnosis of SARS-CoV-2 by FDA under an Emergency Use Authorization (EUA). This EUA will remain in effect (meaning this test can be used) for the duration of the COVID-19 declaration under Section 564(b)(1) of the Act, 21 U.S.C. section 360bbb-3(b)(1), unless the authorization is terminated or revoked.  Performed at [facility], [address] [place], [place] [address]          Radiology Studies: NM Pulmonary Perfusion  Result Date:  10/07/2020 CLINICAL DATA:  Shortness of breath EXAM: NUCLEAR MEDICINE PERFUSION LUNG SCAN TECHNIQUE: Perfusion images were obtained in multiple projections after intravenous injection of radiopharmaceutical. Views: Anterior, posterior, left lateral, right lateral, RPO, LPO, RAO, LAO. RADIOPHARMACEUTICALS:  4.16 mCi Tc-26m MAA IV COMPARISON:  Chest radiograph October 07, 2020 FINDINGS: Radiotracer uptake is homogeneous and symmetric bilaterally. No perfusion defects evident. IMPRESSION: No perfusion defects evident. No findings indicative of pulmonary embolus. Electronically Signed   By: Bretta Bang III M.D.   On: 10/07/2020 15:03   DG Chest Port 1 View  Result Date: 10/07/2020 CLINICAL DATA:  Shortness of breath, respiratory failure EXAM: PORTABLE CHEST 1 VIEW COMPARISON:  10/05/2020 FINDINGS: Mild cardiomegaly, stable. Mild pulmonary vascular congestion. Diffuse interstitial prominence has slightly improved compared  to the previous study. No new focal airspace consolidation. No pleural effusion or pneumothorax is seen. IMPRESSION: Mild cardiomegaly with pulmonary vascular congestion. Diffuse interstitial prominence has slightly improved compared to the previous study. Electronically Signed   By: Duanne Guess D.O.   On: 10/07/2020 12:49   ECHOCARDIOGRAM COMPLETE  Result Date: 10/07/2020    ECHOCARDIOGRAM REPORT   Patient Name:   CHASKE PASKETT Date of Exam: 10/06/2020 Medical Rec #:  [id number]      Height:       72.0 in Accession #:    [id number]     Weight:       236.0 lb Date of Birth:  1951-07-17       BSA:          2.286 m Patient Age:    69 years       BP:           144/69 mmHg Patient Gender: M              HR:           123 bpm. Exam Location:  [facility] Procedure: 2D Echo, Cardiac Doppler and Color Doppler Indications:     R06.00 Dyspnea  History:         Patient has no prior history of Echocardiogram examinations.                  Risk Factors:Hypertension, Dyslipidemia and Diabetes. Chronic                  kidney disease.  Sonographer:     Sedonia Small Rodgers-Jones Referring Phys:  [id number] Floreen Comber PATEL Diagnosing Phys: Yvonne Kendall MD IMPRESSIONS  1. Left ventricular ejection fraction, by estimation, is 55 to 60%. The left ventricle has normal function. The left ventricle has no regional wall motion abnormalities. There is mild left ventricular hypertrophy. Left ventricular diastolic parameters are indeterminate.  2. Right ventricular systolic function is moderately reduced. The right ventricular size is mildly enlarged. There is mildly elevated pulmonary artery systolic pressure.  3. Left atrial size was mildly dilated.  4. Right atrial size was mildly dilated.  5. A small pericardial effusion is present.  6. The mitral valve is normal in structure. Mild mitral valve regurgitation. No evidence of mitral stenosis.  7. Tricuspid valve regurgitation is mild to moderate.  8. The aortic valve is tricuspid. Aortic  valve regurgitation is not visualized. No aortic stenosis is present.  9. The inferior vena cava is dilated in size with <50% respiratory variability, suggesting right atrial pressure of 15 mmHg. FINDINGS  Left Ventricle: Left ventricular ejection fraction, by estimation, is 55 to 60%. The left ventricle has normal function. The left ventricle has no  regional wall motion abnormalities. The left ventricular internal cavity size was normal in size. There is  mild left ventricular hypertrophy. Left ventricular diastolic parameters are indeterminate. Right Ventricle: The right ventricular size is mildly enlarged. No increase in right ventricular wall thickness. Right ventricular systolic function is moderately reduced. There is mildly elevated pulmonary artery systolic pressure. The tricuspid regurgitant velocity is 2.31 m/s, and with an assumed right atrial pressure of 15 mmHg, the estimated right ventricular systolic pressure is 36.3 mmHg. Left Atrium: Left atrial size was mildly dilated. Right Atrium: Right atrial size was mildly dilated. Pericardium: A small pericardial effusion is present. Mitral Valve: The mitral valve is normal in structure. Mild mitral valve regurgitation. No evidence of mitral valve stenosis. Tricuspid Valve: The tricuspid valve is normal in structure. Tricuspid valve regurgitation is mild to moderate. Aortic Valve: The aortic valve is tricuspid. Aortic valve regurgitation is not visualized. No aortic stenosis is present. Pulmonic Valve: The pulmonic valve was not well visualized. Pulmonic valve regurgitation is not visualized. No evidence of pulmonic stenosis. Aorta: The aortic root is normal in size and structure. Venous: The inferior vena cava is dilated in size with less than 50% respiratory variability, suggesting right atrial pressure of 15 mmHg. IAS/Shunts: The interatrial septum was not well visualized.  TRICUSPID VALVE TR Peak grad:   21.3 mmHg TR Vmax:        231.00 cm/s Yvonne Kendall MD Electronically signed by Yvonne Kendall MD Signature Date/Time: 10/07/2020/7:03:35 AM    Final         Scheduled Meds: . amLODipine  10 mg Oral Daily  . aspirin EC  81 mg Oral Daily  . atorvastatin  40 mg Oral Daily  . carvedilol  25 mg Oral BID WC  . ferrous sulfate  325 mg Oral BID WC  . [START ON 10/09/2020] furosemide  40 mg Intravenous Daily  . hydrALAZINE  50 mg Oral BID  . insulin aspart  0-15 Units Subcutaneous TID WC  . insulin aspart  0-5 Units Subcutaneous QHS  . insulin detemir  20 Units Subcutaneous Daily  . patiromer  16.8 g Oral Daily  . pneumococcal 23 valent vaccine  0.5 mL Intramuscular Tomorrow-1000  . vitamin B-12  1,000 mcg Oral Daily   Continuous Infusions: . heparin 1,700 Units/hr (10/08/20 1218)     LOS: 3 days    Time spent: 15 minutes    Tresa Moore, MD [facility] Pager [phone]  If 7PM-7AM, please contact night-coverage 10/08/2020, 2:35 PM

## 2020-10-08 NOTE — Consult Note (Signed)
ANTICOAGULATION CONSULT NOTE  Pharmacy Consult for Heparin Indication: chest pain/ACS  Allergies  Allergen Reactions   Minocycline Rash    Patient Measurements: Height: 6\' 1"  (185.4 cm) Weight: 109.4 kg (241 lb 1.6 oz) IBW/kg (Calculated) : 79.9 Heparin Dosing Weight: 99.9 kg  Vital Signs: Temp: 98.8 F (37.1 C) (12/16 0432) Temp Source: Oral (12/16 0432) BP: 116/71 (12/16 0432) Pulse Rate: 73 (12/16 0432)  Labs: Recent Labs    10/05/20 1928 10/05/20 2212 10/06/20 0027 10/06/20 0435 10/06/20 1200 10/06/20 2000 10/07/20 0200 10/07/20 0518 10/08/20 0434  HGB 8.8*  --   --  8.9*  --   --   --  8.0* 8.4*  HCT 30.3*  --   --  28.7*  --   --   --  27.2* 27.9*  PLT 221  --   --  196  --   --   --  212 228  APTT 34  --   --   --   --   --   --   --   --   LABPROT 14.9  --   --   --   --   --   --   --   --   INR 1.2  --   --   --   --   --   --   --   --   HEPARINUNFRC  --   --   --  0.28*   < > 0.52 0.51  --  0.39  CREATININE 2.50*  --   --  2.17*  --   --   --  2.09*  --   TROPONINIHS 466* 1,230* 903*  --   --   --   --   --   --    < > = values in this interval not displayed.    Estimated Creatinine Clearance: 43.9 mL/min (A) (by C-G formula based on SCr of 2.09 mg/dL (H)).   Medical History: Past Medical History:  Diagnosis Date   Anemia due to chronic kidney disease    CKD (chronic kidney disease), stage III (HCC)    Hyperlipidemia associated with type 2 diabetes mellitus (HCC)    Hypertension associated with diabetes (HCC)    Insulin dependent type 2 diabetes mellitus (HCC)    PAD (peripheral artery disease) (HCC)     Medications:  Medications Prior to Admission  Medication Sig Dispense Refill Last Dose   atorvastatin (LIPITOR) 40 MG tablet Take 40 mg by mouth at bedtime.   10/06/2020 at Unknown time   amLODipine (NORVASC) 10 MG tablet Take 10 mg by mouth daily.      aspirin 81 MG chewable tablet Chew 81 mg by mouth daily.      carvedilol  (COREG) 25 MG tablet Take 25 mg by mouth 2 (two) times daily.      chlorthalidone (HYGROTON) 50 MG tablet Take 50 mg by mouth daily.      clopidogrel (PLAVIX) 75 MG tablet Take 75 mg by mouth daily.      ferrous sulfate 325 (65 FE) MG tablet Take 325 mg by mouth daily with breakfast.      gabapentin (NEURONTIN) 300 MG capsule Take 300 mg by mouth at bedtime.      hydrALAZINE (APRESOLINE) 50 MG tablet Take 50 mg by mouth 2 (two) times daily.      lisinopril (ZESTRIL) 5 MG tablet Take 10 mg by mouth daily.      sodium zirconium cyclosilicate (  LOKELMA) 10 g PACK packet Take 10 g by mouth every other day.      Scheduled:  Infusions:  PRN:  Anti-infectives (From admission, onward)   None      Assessment: Heparin Dosing Weight: 99.9 kg Pharmacy consulted for heparin for ACS. Trop elevated. No note of DOAC PTA.   Goal of Therapy:  Heparin level 0.3-0.7 units/ml Monitor platelets by anticoagulation protocol: Yes   12/14 0435 0.28, subtherapeutic 12/14 1200 0.29, subtherapeutic 12/14 2000 0.52, therapeutic.  12/15 0200 0.51, therapeutic 12/16 0434 0.39, therapeutic    Plan:  Heparin level is therapeutic. Will continue heparin infusion at 1700 units/hr.  Will check anti-Xa daily with AM labs while therapeutic. CBC daily while on heparin.   Anthony Fox D 10/08/2020 6:31 AM

## 2020-10-08 NOTE — Care Management Important Message (Signed)
Important Message  Patient Details  Name: Anthony Fox MRN: [id number] Date of Birth: [birth year]   Medicare Important Message Given:  Yes     [name] 10/08/2020, 1:33 PM

## 2020-10-08 NOTE — Progress Notes (Signed)
Mobility Specialist - Progress Note   10/08/20 1221  Mobility  Activity Ambulated in room  Level of Assistance Standby assist, set-up cues, supervision of patient - no hands on  Assistive Device Front wheel walker  Distance Ambulated (ft) 20 ft  Mobility Response Tolerated well  Mobility performed by Mobility specialist  $Mobility charge 1 Mobility    Pre-mobility: 74 HR, 91% SpO2 During mobility: 85 HR, 98% SpO2 Post-mobility: 84 HR, 98% SpO2   Pt laying in bed upon arrival. Pt agreed to session. Pt independent w/ bed mobility. Pt mod. Independent S2S to RW. (Pt utilizes a cane at baseline). Pt ambulated 20' total in room SBA. No LOB noted. No c/o pain or SOB. O2 sat > 95% during ambulation. Pt on 2L O2 . Overall, pt tolerated session well. Pt pleasant and motivated. Pt left sitting EOB w/ all needs placed in reach. NT entered the room at the end of session. Nurse was notified.     Reina Wilton Mobility Specialist  10/08/20, 12:24 PM

## 2020-10-08 NOTE — Progress Notes (Signed)
Progress Note  Patient Name: Anthony Fox Date of Encounter: 10/08/2020  Primary Cardiologist: New to [facility] - consult by End  Subjective   Dyspnea about the same as yesterday with some improvement in wheezing. No chest pain or palpitations. Remains on supplemental oxygen. Since being on telemetry on 2A, he has been in rate-controlled Afib with brief RVR noted. Documented UOP 2.4 mL for the past 24 hours with a net - 4.8 L for the admission. Weight 107.5-->106.1-->109.4 kg (accuracy?). BMP with slight up trending BUN/SCr. Echo showed an EF of 55-60%, no RWMA, mild LVH, moderately reduced RVSF with a mildly enlarged RV cavity size, mildly elevated RVSP,mild biatrial enlargement, mild MR, mild to moderate TR, and a right atrial pressure of 15.   Inpatient Medications    Scheduled Meds: . amLODipine  10 mg Oral Daily  . aspirin EC  81 mg Oral Daily  . atorvastatin  40 mg Oral Daily  . carvedilol  25 mg Oral BID WC  . ferrous sulfate  325 mg Oral BID WC  . furosemide  40 mg Intravenous Q12H  . hydrALAZINE  50 mg Oral BID  . insulin aspart  0-15 Units Subcutaneous TID WC  . insulin aspart  0-5 Units Subcutaneous QHS  . insulin detemir  20 Units Subcutaneous Daily  . patiromer  16.8 g Oral Daily  . pneumococcal 23 valent vaccine  0.5 mL Intramuscular Tomorrow-1000  . vitamin B-12  1,000 mcg Oral Daily   Continuous Infusions: . heparin 1,700 Units/hr (10/07/20 1922)   PRN Meds: acetaminophen **OR** acetaminophen, ondansetron **OR** ondansetron (ZOFRAN) IV   Vital Signs    Vitals:   10/07/20 1631 10/07/20 2002 10/08/20 0212 10/08/20 0432  BP: 132/84 133/74  116/71  Pulse: 68 (!) 58  73  Resp:  19  19  Temp: 98.9 F (37.2 C) 98.4 F (36.9 C)  98.8 F (37.1 C)  TempSrc: Oral Oral  Oral  SpO2: 99% 95%  97%  Weight:   109.4 kg   Height:        Intake/Output Summary (Last 24 hours) at 10/08/2020 0720 Last data filed at 10/08/2020 0500 Gross per 24 hour  Intake 639.17 ml   Output 3050 ml  Net -2410.83 ml   Filed Weights   10/06/20 0545 10/07/20 0313 10/08/20 0212  Weight: 107.5 kg 106.1 kg 109.4 kg    Telemetry    Afib with mostly controlled ventricular response with occasional/brief episodes of RVR into the 1-teens to 130s bpm - Personally Reviewed  ECG    EKG ordered to document Afib, not completed - Personally Reviewed  Physical Exam   GEN: No acute distress.   Neck: JVD ~ 8 cm. Cardiac: IRIR, no murmurs, rubs, or gallops.  Respiratory: Faint bibasilar crackles R>L.  GI: Soft, nontender, non-distended.   MS: No edema; No deformity. Neuro:  Alert and oriented x 3; Nonfocal.  Psych: Normal affect.  Labs    Chemistry Recent Labs  Lab 10/05/20 1928 10/06/20 0435 10/07/20 0518  NA 139 141 140  K 5.7* 5.5* 4.8  CL 109 110 110  CO2 20* 21* 21*  GLUCOSE 305* 281* 213*  BUN 61* 61* 74*  CREATININE 2.50* 2.17* 2.09*  CALCIUM 8.8* 8.9 8.7*  PROT 8.2* 7.6  --   ALBUMIN 3.9 3.6  --   AST 45* 36  --   ALT 68* 62*  --   ALKPHOS 115 107  --   BILITOT 1.5* 1.3*  --  GFRNONAA 27* 32* 34*  ANIONGAP 10 10 9      Hematology Recent Labs  Lab 10/06/20 0435 10/07/20 0518 10/08/20 0434  WBC 8.8 11.3* 8.1  RBC 3.37* 3.16* 3.31*  HGB 8.9* 8.0* 8.4*  HCT 28.7* 27.2* 27.9*  MCV 85.2 86.1 84.3  MCH 26.4 25.3* 25.4*  MCHC 31.0 29.4* 30.1  RDW 15.2 15.0 14.8  PLT 196 212 228    Cardiac EnzymesNo results for input(s): TROPONINI in the last 168 hours. No results for input(s): TROPIPOC in the last 168 hours.   BNP Recent Labs  Lab 10/05/20 1928  BNP 1,365.1*     DDimer No results for input(s): DDIMER in the last 168 hours.   Radiology    10/07/20 Venous Img Lower Bilateral  Result Date: 10/05/2020 IMPRESSION: 1. No sonographic findings of DVT. 2. 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 Electronically Signed   By: 10/07/2020 M.D.   On: 10/05/2020 23:18   DG Chest Portable 1 View  Result Date: 10/05/2020 IMPRESSION: Mild cardiomegaly with  central pulmonary vascular congestion and probable mild bilateral pulmonary edema. Electronically Signed   By: 10/07/2020 M.D.   On: 10/05/2020 19:49   Cardiac Studies   2D echo 10/06/2020: 1. Left ventricular ejection fraction, by estimation, is 55 to 60%. The  left ventricle has normal function. The left ventricle has no regional  wall motion abnormalities. There is mild left ventricular hypertrophy.  Left ventricular diastolic parameters  are indeterminate.  2. Right ventricular systolic function is moderately reduced. The right  ventricular size is mildly enlarged. There is mildly elevated pulmonary  artery systolic pressure.  3. Left atrial size was mildly dilated.  4. Right atrial size was mildly dilated.  5. A small pericardial effusion is present.  6. The mitral valve is normal in structure. Mild mitral valve  regurgitation. No evidence of mitral stenosis.  7. Tricuspid valve regurgitation is mild to moderate.  8. The aortic valve is tricuspid. Aortic valve regurgitation is not  visualized. No aortic stenosis is present.  9. The inferior vena cava is dilated in size with <50% respiratory  variability, suggesting right atrial pressure of 15 mmHg.  Patient Profile     69 y.o. 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 [facility].  Assessment & Plan    1. Acute hypoxic respiratory distress/acute HFpEF/elevated D-dimer: -Respiratory status is improving though he does remain volume overloaded with faint bibasilar crackles on exam with the R>L -Slight bump  in BUN/SCr this morning -Decrease IV Lasix to 40 mg daily -Continue carvedilol -Not a candidate for spironolactone with acute on CKD and history of recurrent hyperkalemia  -Ischemic evaluation will be needed as outlined below following improvement in his respiratory status with diuresis  -Unable to proceed with chest CTA secondary to underlying renal dysfunction with VQ scan being low probability for PE -Continue to wean supplemental oxygen as able  -CHF education -Daily weights, question accuracy  -Strict I's and O's  2.  Elevated troponin: -Never with chest pain though with significant exertional dyspnea in a patient with multiple risk factors for CAD  including insulin-dependent diabetes mellitus, prior tobacco use, hypertension, hyperlipidemia, male sex, and obesity -Troponin peaked at approximately 1200 and is currently downtrending -ASA -Heparin drip for now to complete 48-72 hours of medical therapy -Echo as above -He will need an ischemic evaluation, possibly with a Lexiscan MPI down the road once his volume status is improved -Not currently a cath candidate with acute on CKD -LDL 61  3.  Acute on CKD stage IIIb with recurrent hyperkalemia: -Renal function has been improving with diuresis and is pending this morning -Status post Veltassa with slightly improved potassium -Continue to monitor closely  4.  Insulin-dependent diabetes mellitus: -A1c 8.1 this admission -SSI per IM  5.  Anemia of chronic disease/iron deficiency: -Hgb low, though stable -Monitor on heparin drip  6.  PAF: -[facility] note indicates patient has a history of PAF and has been managed on Plavix -Review of [place] [facility] and Care Everywhere showed no objective evidence of A. Fib at time of consult and he was in sinus rhythm while in the ED -Noted be in Afib on telemetry 10/07/2020, he is in rate-controlled Afib -Asymptomatic  -CHADS2VASc 5 (CHF, HTN, age, DM, vascular disease) -Continue heparin gtt  as above with recommendation to transition to [facility] prior to discharge, once it is clear he will not need inpatient invasive studies, given his stroke risk  -Given his underlying PAD, he will need dual therapy with Eliquis and Plavix  -Echo as above -TSH normal -Potassium and magnesium have been at goal  7.  PAD: -Followed by [facility] vascular surgery -Plavix on hold while he is on heparin per internal medicine -He remains on atorvastatin  8.  HTN: -Blood pressure has overall been improved and relatively well controlled with a noted elevated reading this morning -Continue current medical therapy including carvedilol, amlodipine, hydralazine and IV Lasix -Lisinopril held with AKI and hyperkalemia  -Chlorthalidone held in the setting of IV Lasix use -If needed, could titrate hydralazine to 100 mg bid  9.  HLD: -LDL 61 this admission  -PTA atorvastatin   For questions or updates, please contact [facility] Please consult [email or web address] for contact info under Cardiology/STEMI.    Signed, Eula Listen, PA-C [facility] Pager: [phone] 10/08/2020, 7:20 AM

## 2020-10-08 NOTE — Progress Notes (Signed)
SATURATION QUALIFICATIONS: (This note is used to comply with regulatory documentation for home oxygen)  Patient Saturations on Room Air at Rest = 88%  Patient Saturations on Room Air while Ambulating = na%  Patient Saturations on 1 Liters of oxygen while at Rest = 95%  Please briefly explain why patient needs home oxygen:

## 2020-10-08 NOTE — Progress Notes (Addendum)
Progress Note  Patient Name: Anthony Fox Date of Encounter: 10/08/2020  Primary Cardiologist: New to [facility] - consult by End  Subjective   Dyspnea about the same as yesterday with some improvement in wheezing. No chest pain or palpitations. Remains on supplemental oxygen. Since being on telemetry on 2A, he has been in rate-controlled Afib with brief RVR noted. Documented UOP 2.4 mL for the past 24 hours with a net - 4.8 L for the admission. Weight 107.5-->106.1-->109.4 kg (accuracy?). BMP with slight up trending BUN/SCr. Echo showed an EF of 55-60%, no RWMA, mild LVH, moderately reduced RVSF with a mildly enlarged RV cavity size, mildly elevated RVSP,mild biatrial enlargement, mild MR, mild to moderate TR, and a right atrial pressure of 15.   Inpatient Medications    Scheduled Meds: . amLODipine  10 mg Oral Daily  . aspirin EC  81 mg Oral Daily  . atorvastatin  40 mg Oral Daily  . carvedilol  25 mg Oral BID WC  . ferrous sulfate  325 mg Oral BID WC  . [START ON 10/09/2020] furosemide  40 mg Intravenous Daily  . hydrALAZINE  50 mg Oral BID  . insulin aspart  0-15 Units Subcutaneous TID WC  . insulin aspart  0-5 Units Subcutaneous QHS  . insulin detemir  20 Units Subcutaneous Daily  . patiromer  16.8 g Oral Daily  . pneumococcal 23 valent vaccine  0.5 mL Intramuscular Tomorrow-1000  . vitamin B-12  1,000 mcg Oral Daily   Continuous Infusions: . heparin 1,700 Units/hr (10/08/20 1218)   PRN Meds: acetaminophen **OR** acetaminophen, ondansetron **OR** ondansetron (ZOFRAN) IV   Vital Signs    Vitals:   10/08/20 0915 10/08/20 1206 10/08/20 1250 10/08/20 1254  BP:  126/65    Pulse:  68    Resp:  16    Temp:  98.8 F (37.1 C)    TempSrc:  Oral    SpO2: 99% 99% (!) 88% 95%  Weight:      Height:        Intake/Output Summary (Last 24 hours) at 10/08/2020 1434 Last data filed at 10/08/2020 1345 Gross per 24 hour  Intake 1119.17 ml  Output 2551 ml  Net -1431.83 ml    Filed Weights   10/06/20 0545 10/07/20 0313 10/08/20 0212  Weight: 107.5 kg 106.1 kg 109.4 kg    Telemetry    Afib with mostly controlled ventricular response with occasional/brief episodes of RVR into the 1-teens to 130s bpm - Personally Reviewed  ECG    EKG ordered to document Afib, not completed - Personally Reviewed  Physical Exam   GEN: No acute distress.   Neck: JVD ~ 8 cm. Cardiac: IRIR, no murmurs, rubs, or gallops.  Respiratory: Faint bibasilar crackles R>L.  GI: Soft, nontender, non-distended.   MS: No edema; No deformity. Neuro:  Alert and oriented x 3; Nonfocal.  Psych: Normal affect.  Labs    Chemistry Recent Labs  Lab 10/05/20 1928 10/06/20 0435 10/07/20 0518 10/08/20 0434  NA 139 141 140 134*  K 5.7* 5.5* 4.8 4.0  CL 109 110 110 105  CO2 20* 21* 21* 20*  GLUCOSE 305* 281* 213* 238*  BUN 61* 61* 74* 83*  CREATININE 2.50* 2.17* 2.09* 2.13*  CALCIUM 8.8* 8.9 8.7* 8.5*  PROT 8.2* 7.6  --   --   ALBUMIN 3.9 3.6  --   --   AST 45* 36  --   --   ALT 68* 62*  --   --  ALKPHOS 115 107  --   --   BILITOT 1.5* 1.3*  --   --   GFRNONAA 27* 32* 34* 33*  ANIONGAP 10 10 9 9      Hematology Recent Labs  Lab 10/06/20 0435 10/07/20 0518 10/08/20 0434  WBC 8.8 11.3* 8.1  RBC 3.37* 3.16* 3.31*  HGB 8.9* 8.0* 8.4*  HCT 28.7* 27.2* 27.9*  MCV 85.2 86.1 84.3  MCH 26.4 25.3* 25.4*  MCHC 31.0 29.4* 30.1  RDW 15.2 15.0 14.8  PLT 196 212 228    Cardiac EnzymesNo results for input(s): TROPONINI in the last 168 hours. No results for input(s): TROPIPOC in the last 168 hours.   BNP Recent Labs  Lab 10/05/20 1928  BNP 1,365.1*     DDimer No results for input(s): DDIMER in the last 168 hours.   Radiology    10/07/20 Venous Img Lower Bilateral  Result Date: 10/05/2020 IMPRESSION: 1. No sonographic findings of DVT. 2. 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 Electronically Signed   By: 10/07/2020 M.D.   On: 10/05/2020 23:18   DG Chest Portable 1 View  Result  Date: 10/05/2020 IMPRESSION: Mild cardiomegaly with central pulmonary vascular congestion and probable mild bilateral pulmonary edema. Electronically Signed   By: 10/07/2020 M.D.   On: 10/05/2020 19:49   Cardiac Studies   2D echo 10/06/2020: 1. Left ventricular ejection fraction, by estimation, is 55 to 60%. The  left ventricle has normal function. The left ventricle has no regional  wall motion abnormalities. There is mild left ventricular hypertrophy.  Left ventricular diastolic parameters  are indeterminate.  2. Right ventricular systolic function is moderately reduced. The right  ventricular size is mildly enlarged. There is mildly elevated pulmonary  artery systolic pressure.  3. Left atrial size was mildly dilated.  4. Right atrial size was mildly dilated.  5. A small pericardial effusion is present.  6. The mitral valve is normal in structure. Mild mitral valve  regurgitation. No evidence of mitral stenosis.  7. Tricuspid valve regurgitation is mild to moderate.  8. The aortic valve is tricuspid. Aortic valve regurgitation is not  visualized. No aortic stenosis is present.  9. The inferior vena cava is dilated in size with <50% respiratory  variability, suggesting right atrial pressure of 15 mmHg.  Patient Profile     69 y.o. male with history of PAD with extensive interventions as outlined in his consult note including left transmetatarsal and right first and second toe amputations, reported Afib on Plavix followed by [facility] (EKGs in [facility] and Care Everywhere without evidence of Afib), IDDM2, CKD stage IIIb with recurrent hyperkalemia, HTN, HLD, anemia of chronic disease, and prior tobacco use who is being seen today for the evaluation of dyspnea and elevated troponin at the request of Dr. [facility].  Assessment & Plan    1. Acute hypoxic respiratory distress/acute HFpEF/elevated D-dimer: -Respiratory status is improving though he does remain volume overloaded with faint  bibasilar crackles on exam with the R>L -Slight bump in BUN/SCr this morning -Decrease IV Lasix to 40 mg daily -Continue carvedilol -Not a candidate for spironolactone with acute on CKD and history of recurrent hyperkalemia  -Ischemic evaluation will be needed as outlined below   -Unable to proceed with chest CTA secondary to underlying renal dysfunction with VQ scan being low probability for PE -Continue to wean supplemental oxygen as able  -CHF education -Daily weights, question accuracy  -Strict I's and O's  2.  Elevated troponin: -Never with chest  pain though with significant exertional dyspnea in a patient with multiple risk factors for CAD including insulin-dependent diabetes mellitus, prior tobacco use, hypertension, hyperlipidemia, male sex, and obesity -Troponin peaked at approximately 1200 and is currently downtrending -ASA -Heparin drip for now to complete 72 hours of medical therapy -Echo as above -N.p.o. at midnight -Schedule Lexiscan MPI for 12/17 -Not currently a cath candidate with acute on CKD -LDL 61  3.  Acute on CKD stage IIIb with recurrent hyperkalemia: -Renal function has been improving with diuresis and is pending this morning -Status post Veltassa with slightly improved potassium -Continue to monitor closely  4.  Insulin-dependent diabetes mellitus: -A1c 8.1 this admission -SSI per IM  5.  Anemia of chronic disease/iron deficiency: -Hgb low, though stable -Monitor on heparin drip  6.  PAF: -[facility] note indicates patient has a history of PAF and has been managed on Plavix -Review of [place] [facility] and Care Everywhere showed no objective evidence of A. Fib at time of consult and he was in sinus rhythm while in the ED -Noted be in Afib on telemetry 10/07/2020, he is in rate-controlled Afib -Asymptomatic  -CHADS2VASc 5 (CHF, HTN, age, DM, vascular disease) -Continue heparin gtt as above with recommendation to transition to [facility] prior to discharge,  once it is clear he will not need inpatient invasive studies, given his stroke risk  -Given his underlying PAD, he will need dual therapy with Eliquis and Plavix  -Echo as above -TSH normal -Potassium and magnesium have been at goal  7.  PAD: -Followed by [facility] vascular surgery -Plavix on hold while he is on heparin per internal medicine -He remains on atorvastatin  8.  HTN: -Blood pressure has overall been improved and relatively well controlled with a noted elevated reading this morning -Continue current medical therapy including carvedilol, amlodipine, hydralazine and IV Lasix -Lisinopril held with AKI and hyperkalemia  -Chlorthalidone held in the setting of IV Lasix use -If needed, could titrate hydralazine to 100 mg bid  9.  HLD: -LDL 61 this admission  -PTA atorvastatin   For questions or updates, please contact [facility] Please consult [email or web address] for contact info under Cardiology/STEMI.    Signed, Eula Listen, PA-C [facility] Pager: [phone] 10/08/2020, 2:34 PM

## 2020-10-09 ENCOUNTER — Inpatient Hospital Stay (HOSPITAL_COMMUNITY): Payer: Medicare HMO

## 2020-10-09 DIAGNOSIS — J9601 Acute respiratory failure with hypoxia: Secondary | ICD-10-CM

## 2020-10-09 DIAGNOSIS — R079 Chest pain, unspecified: Secondary | ICD-10-CM

## 2020-10-09 DIAGNOSIS — I5031 Acute diastolic (congestive) heart failure: Secondary | ICD-10-CM

## 2020-10-09 LAB — CBC WITH DIFFERENTIAL/PLATELET
Abs Immature Granulocytes: 0.02 10*3/uL (ref 0.00–0.07)
Basophils Absolute: 0 10*3/uL (ref 0.0–0.1)
Basophils Relative: 0 %
Eosinophils Absolute: 0.4 10*3/uL (ref 0.0–0.5)
Eosinophils Relative: 6 %
HCT: 28.1 % — ABNORMAL LOW (ref 39.0–52.0)
Hemoglobin: 8.4 g/dL — ABNORMAL LOW (ref 13.0–17.0)
Immature Granulocytes: 0 %
Lymphocytes Relative: 18 %
Lymphs Abs: 1 10*3/uL (ref 0.7–4.0)
MCH: 24.9 pg — ABNORMAL LOW (ref 26.0–34.0)
MCHC: 29.9 g/dL — ABNORMAL LOW (ref 30.0–36.0)
MCV: 83.1 fL (ref 80.0–100.0)
Monocytes Absolute: 0.7 10*3/uL (ref 0.1–1.0)
Monocytes Relative: 12 %
Neutro Abs: 3.7 10*3/uL (ref 1.7–7.7)
Neutrophils Relative %: 64 %
Platelets: 233 10*3/uL (ref 150–400)
RBC: 3.38 MIL/uL — ABNORMAL LOW (ref 4.22–5.81)
RDW: 14.9 % (ref 11.5–15.5)
WBC: 5.8 10*3/uL (ref 4.0–10.5)
nRBC: 0.7 % — ABNORMAL HIGH (ref 0.0–0.2)

## 2020-10-09 LAB — NM MYOCAR MULTI W/SPECT W/WALL MOTION / EF
Estimated workload: 1 METS
Exercise duration (min): 0 min
Exercise duration (sec): 0 s
LV dias vol: 124 mL (ref 62–150)
LV sys vol: 53 mL
MPHR: 152 {beats}/min
Peak HR: 77 {beats}/min
Percent HR: 50 %
Rest HR: 64 {beats}/min
SDS: 2
SRS: 4
SSS: 4
TID: 1.03

## 2020-10-09 LAB — BASIC METABOLIC PANEL
Anion gap: 10 (ref 5–15)
BUN: 73 mg/dL — ABNORMAL HIGH (ref 8–23)
CO2: 20 mmol/L — ABNORMAL LOW (ref 22–32)
Calcium: 8.5 mg/dL — ABNORMAL LOW (ref 8.9–10.3)
Chloride: 106 mmol/L (ref 98–111)
Creatinine, Ser: 1.79 mg/dL — ABNORMAL HIGH (ref 0.61–1.24)
GFR, Estimated: 41 mL/min — ABNORMAL LOW (ref >60)
Glucose, Bld: 189 mg/dL — ABNORMAL HIGH (ref 70–99)
Potassium: 3.8 mmol/L (ref 3.5–5.1)
Sodium: 136 mmol/L (ref 135–145)

## 2020-10-09 LAB — HEPARIN LEVEL (UNFRACTIONATED): Heparin Unfractionated: 0.45 IU/mL (ref 0.30–0.70)

## 2020-10-09 LAB — GLUCOSE, CAPILLARY
Glucose-Capillary: 181 mg/dL — ABNORMAL HIGH (ref 70–99)
Glucose-Capillary: 272 mg/dL — ABNORMAL HIGH (ref 70–99)

## 2020-10-09 MED ORDER — REGADENOSON 0.4 MG/5ML IV SOLN
0.4000 mg | Freq: Once | INTRAVENOUS | Status: AC
  Administered 2020-10-09: 12:00:00 0.4 mg via INTRAVENOUS

## 2020-10-09 MED ORDER — FUROSEMIDE 40 MG PO TABS: 40.0000 mg | ORAL_TABLET | Freq: Every day | ORAL | Status: DC

## 2020-10-09 MED ORDER — TECHNETIUM TC 99M TETROFOSMIN IV KIT
10.2700 | PACK | Freq: Once | INTRAVENOUS | Status: AC | PRN
  Administered 2020-10-09: 10:00:00 10.27 via INTRAVENOUS

## 2020-10-09 MED ORDER — TECHNETIUM TC 99M TETROFOSMIN IV KIT
27.6670 | PACK | Freq: Once | INTRAVENOUS | Status: AC | PRN
  Administered 2020-10-09: 12:00:00 27.667 via INTRAVENOUS

## 2020-10-09 MED ORDER — FUROSEMIDE 40 MG PO TABS: 40.0000 mg | ORAL_TABLET | Freq: Every day | ORAL | 0 refills | Status: DC

## 2020-10-09 NOTE — Discharge Summary (Signed)
Physician Discharge Summary  Anthony Fox JJO:[id number] DOB: 05/17/1951 DOA: 10/05/2020  PCP: Patient, No Pcp Per  Admit date: 10/05/2020 Discharge date: 10/09/2020  Admitted From: Home Disposition: Home  Recommendations for Outpatient Follow-up:  1. Follow up with PCP in 1-2 weeks 2. Follow-up with heart failure clinic 10/14/2020  Home Health: No Equipment/Devices: None Discharge Condition: Stable CODE STATUS: Full Diet recommendation: Heart Healthy  Brief/Interim Summary: 69 y.o.malewith medical history significant forinsulin-dependent type 2 diabetes, PAD (s/p right femoropopliteal angioplasty with drug-coated balloon to 12/06/2019), PAF on aspirin/Plavix, CKD stage III, hypertension, hyperlipidemia, recurrent hyperkalemia, anemia of chronic kidney disease who presents to the ED for evaluation of shortness of breath.Majority of care is through [facility] all prior records in Care Everywhere.  Patient states he has been having 3 or 4 months of progressive shortness of breath. He has been having notable orthopnea, dyspnea on exertion, paroxysmal nocturnal dyspnea, and subjective weight gain. Symptoms significantly worsened over the last few days and eventually called EMS  12/15: Patient reports improvement in respiratory symptoms.  Net -3 L since admission.  Diuresing well.  12/16: Dyspnea consistent.  Some improvement in wheezing.  Remains on supplemental oxygen.  Net -4.8 L since admission.  VQ scan low probability for pulmonary embolism  12/17: Patient underwent ischemic evaluation with nuclear stress test.  Communicated with cardiology, stress test negative for underlying ischemia.  Patient achieve euvolemia time of discharge.  Lasix transition to 40 mg orally every day.  He was discharged on this medication.  I have discontinued his home chlorthalidone.  Discharge Diagnoses:  Principal Problem:   Acute respiratory failure with hypoxia (HCC) Active Problems:   DM (diabetes  mellitus) type II uncontrolled, periph vascular disorder (HCC)   PAD (peripheral artery disease) (HCC)   Hypertension associated with diabetes (HCC)   Anemia of chronic kidney failure, stage 3 (moderate) (HCC)   Paroxysmal atrial fibrillation (HCC)   CKD (chronic kidney disease) stage 3, GFR 30-59 ml/min (HCC)   Hyperkalemia   Elevated troponin   Acute CHF (congestive heart failure) (HCC)   Hyperlipidemia associated with type 2 diabetes mellitus (HCC)   Acute renal failure superimposed on stage 3 chronic kidney disease (HCC)  Acute on chronic decompensated diastolic congestive heart failure Acute respiratory failure secondary to above BNP elevated patient required BiPAP on admission Has improved substantially Net -7.4 L Recorded weights inaccurate Wean off supplemental oxygen at time of discharge Achieve euvolemia Will discharge on Lasix 40 mg p.o. daily Discontinue home chlorthalidone Continue remainder of goal-directed medical therapy  Elevated troponin Unable to rule out ACS/NSTEMI Troponin peaked at 1230 Cardiology on board Patient on heparin drip Heparin drip discontinued at time of discharge No indication for DOAC per cardiology Can continue on antiplatelet therapy as per prior to arrival Nuclear stress test while admitted negative  Paroxysmal atrial fibrillation CHA2DS2-VASc 5 Heart rate currently controlled On heparin gtt. Per cardiology arrhythmia burden is not high Will not recommend DOAC at this time Can continue home antiplatelet therapy Continue Coreg for rate control  Peripheral arterial disease Currently on Lipitor, will continue Continue Plavix and aspirin on discharge  Acute kidney injury on chronic kidney disease stage IIIa Suspect cardiorenal syndrome secondary to decompensated congestive heart failure Creatinine improving over interval   Hyperkalemia Resolved status post [place]   Insulin-dependent type 2 diabetes mellitus with  hyperglycemia Hemoglobin A1c 8.1 Not on any diabetic medications at home Outpatient PCP follow-up  Essential hypertension Adequate control over interval Continue Coreg, amlodipine, Lasix Chlorthalidone held on  discharge Can resume lisinopril  Hyperlipidemia Continue atorvastatin  Anemia of chronic kidney disease Iron deficiency anemia Goal hemoglobin greater than 8 Patient had hemoglobin 8.8 on admission, decreased from previous 12 on 07/16/2020 No signs of bleeding  Elevated D-dimer Unclear etiology VQ scan low probability for pulmonary embolism Bilateral lower extremity duplex negative CTA contraindicated in the setting of AKI  Discharge Instructions  Discharge Instructions    Diet - low sodium heart healthy   Complete by: As directed    Increase activity slowly   Complete by: As directed      Allergies as of 10/09/2020      Reactions   Minocycline Rash      Medication List    STOP taking these medications   chlorthalidone 50 MG tablet Commonly known as: HYGROTON     TAKE these medications   amLODipine 10 MG tablet Commonly known as: NORVASC Take 10 mg by mouth daily.   aspirin 81 MG chewable tablet Chew 81 mg by mouth daily.   atorvastatin 40 MG tablet Commonly known as: LIPITOR Take 40 mg by mouth at bedtime.   carvedilol 25 MG tablet Commonly known as: COREG Take 25 mg by mouth 2 (two) times daily.   clopidogrel 75 MG tablet Commonly known as: PLAVIX Take 75 mg by mouth daily.   ferrous sulfate 325 (65 FE) MG tablet Take 325 mg by mouth daily with breakfast.   furosemide 40 MG tablet Commonly known as: LASIX Take 1 tablet (40 mg total) by mouth daily. Start taking on: October 10, 2020   gabapentin 300 MG capsule Commonly known as: NEURONTIN Take 300 mg by mouth at bedtime.   hydrALAZINE 50 MG tablet Commonly known as: APRESOLINE Take 50 mg by mouth 2 (two) times daily.   lisinopril 5 MG tablet Commonly known as:  ZESTRIL Take 10 mg by mouth daily.   Lokelma 10 g Pack packet Generic drug: sodium zirconium cyclosilicate Take 10 g by mouth every other day.       Follow-up Information    [facility] Follow up on 10/14/2020.   Specialty: Cardiology Why: at 2:30pm. Enter through the Medical Mall entrance Contact information: [address] [place] [id number] [phone]             Allergies  Allergen Reactions  . Minocycline Rash    Consultations:  Cardiology-[facility]   Procedures/Studies: NM Pulmonary Perfusion  Result Date: 10/07/2020 CLINICAL DATA:  Shortness of breath EXAM: NUCLEAR MEDICINE PERFUSION LUNG SCAN TECHNIQUE: Perfusion images were obtained in multiple projections after intravenous injection of radiopharmaceutical. Views: Anterior, posterior, left lateral, right lateral, RPO, LPO, RAO, LAO. RADIOPHARMACEUTICALS:  4.16 mCi Tc-314m MAA IV COMPARISON:  Chest radiograph October 07, 2020 FINDINGS: Radiotracer uptake is homogeneous and symmetric bilaterally. No perfusion defects evident. IMPRESSION: No perfusion defects evident. No findings indicative of pulmonary embolus. Electronically Signed   By: Bretta BangWilliam  Woodruff III M.D.   On: 10/07/2020 15:03   NM Myocar Multi W/Spect W/Wall Motion / EF  Result Date: 10/09/2020  There was no ST segment deviation noted during stress.  The study is normal.  This is a low risk study.  The left ventricular ejection fraction is normal (55-65%).  CT attenuation images showed mild chronic calcifications.    US Venous Img Lower Bilateral  Result Date: 10/05/2020 CLINICAL DATA:  69 year old male with leg swelling. EXAM: Bilateral LOWER EXTREMITY VENOUS DOPPLER ULTRASOUND TECHNIQUE: Gray-scale sonography with compression, as  well as color and duplex ultrasound, were performed to evaluate the deep venous system(s) from the level of the common femoral vein through the  popliteal and proximal calf veins. COMPARISON:  None. FINDINGS: VENOUS Normal compressibility of the common femoral, superficial femoral, and popliteal veins, as well as the visualized calf veins. Visualized portions of profunda femoral vein and great saphenous vein unremarkable. No filling defects to suggest DVT on grayscale or color Doppler imaging. Doppler waveforms show normal direction of venous flow, normal respiratory plasticity and response to augmentation. Limited views of the contralateral common femoral vein are unremarkable. OTHER There is a 2.0 x 0.8 x 1.1 cm cystic structure in the left popliteal fossa, likely Baker's cyst. There is mild subcutaneous edema of the left lower extremity. Limitations: none IMPRESSION: 1. No sonographic findings of DVT. 2. 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 Electronically Signed   By: Elgie Collard M.D.   On: 10/05/2020 23:18   DG Chest Port 1 View  Result Date: 10/07/2020 CLINICAL DATA:  Shortness of breath, respiratory failure EXAM: PORTABLE CHEST 1 VIEW COMPARISON:  10/05/2020 FINDINGS: Mild cardiomegaly, stable. Mild pulmonary vascular congestion. Diffuse interstitial prominence has slightly improved compared to the previous study. No new focal airspace consolidation. No pleural effusion or pneumothorax is seen. IMPRESSION: Mild cardiomegaly with pulmonary vascular congestion. Diffuse interstitial prominence has slightly improved compared to the previous study. Electronically Signed   By: Duanne Guess D.O.   On: 10/07/2020 12:49   DG Chest Portable 1 View  Result Date: 10/05/2020 CLINICAL DATA:  Shortness of breath. EXAM: PORTABLE CHEST 1 VIEW COMPARISON:  None. FINDINGS: Mild cardiomegaly is noted. No pneumothorax or pleural effusion is noted. Mild central pulmonary vascular congestion is noted. Mild interstitial densities are noted in the perihilar and basilar regions concerning for pulmonary edema. Bony thorax is unremarkable. IMPRESSION: Mild cardiomegaly  with central pulmonary vascular congestion and probable mild bilateral pulmonary edema. Electronically Signed   By: Lupita Raider M.D.   On: 10/05/2020 19:49   ECHOCARDIOGRAM COMPLETE  Result Date: 10/07/2020    ECHOCARDIOGRAM REPORT   Patient Name:   Anthony Fox Date of Exam: 10/06/2020 Medical Rec #:  [id number]      Height:       72.0 in Accession #:    [id number]     Weight:       236.0 lb Date of Birth:  23-Aug-1951       BSA:          2.286 m Patient Age:    69 years       BP:           144/69 mmHg Patient Gender: M              HR:           123 bpm. Exam Location:  [facility] Procedure: 2D Echo, Cardiac Doppler and Color Doppler Indications:     R06.00 Dyspnea  History:         Patient has no prior history of Echocardiogram examinations.                  Risk Factors:Hypertension, Dyslipidemia and Diabetes. Chronic                  kidney disease.  Sonographer:     Sedonia Small Rodgers-Jones Referring Phys:  [id number] Floreen Comber PATEL Diagnosing Phys: Yvonne Kendall MD IMPRESSIONS  1. Left ventricular ejection fraction, by estimation, is 55 to 60%. The left ventricle has normal  function. The left ventricle has no regional wall motion abnormalities. There is mild left ventricular hypertrophy. Left ventricular diastolic parameters are indeterminate.  2. Right ventricular systolic function is moderately reduced. The right ventricular size is mildly enlarged. There is mildly elevated pulmonary artery systolic pressure.  3. Left atrial size was mildly dilated.  4. Right atrial size was mildly dilated.  5. A small pericardial effusion is present.  6. The mitral valve is normal in structure. Mild mitral valve regurgitation. No evidence of mitral stenosis.  7. Tricuspid valve regurgitation is mild to moderate.  8. The aortic valve is tricuspid. Aortic valve regurgitation is not visualized. No aortic stenosis is present.  9. The inferior vena cava is dilated in size with <50% respiratory variability, suggesting right  atrial pressure of 15 mmHg. FINDINGS  Left Ventricle: Left ventricular ejection fraction, by estimation, is 55 to 60%. The left ventricle has normal function. The left ventricle has no regional wall motion abnormalities. The left ventricular internal cavity size was normal in size. There is  mild left ventricular hypertrophy. Left ventricular diastolic parameters are indeterminate. Right Ventricle: The right ventricular size is mildly enlarged. No increase in right ventricular wall thickness. Right ventricular systolic function is moderately reduced. There is mildly elevated pulmonary artery systolic pressure. The tricuspid regurgitant velocity is 2.31 m/s, and with an assumed right atrial pressure of 15 mmHg, the estimated right ventricular systolic pressure is 36.3 mmHg. Left Atrium: Left atrial size was mildly dilated. Right Atrium: Right atrial size was mildly dilated. Pericardium: A small pericardial effusion is present. Mitral Valve: The mitral valve is normal in structure. Mild mitral valve regurgitation. No evidence of mitral valve stenosis. Tricuspid Valve: The tricuspid valve is normal in structure. Tricuspid valve regurgitation is mild to moderate. Aortic Valve: The aortic valve is tricuspid. Aortic valve regurgitation is not visualized. No aortic stenosis is present. Pulmonic Valve: The pulmonic valve was not well visualized. Pulmonic valve regurgitation is not visualized. No evidence of pulmonic stenosis. Aorta: The aortic root is normal in size and structure. Venous: The inferior vena cava is dilated in size with less than 50% respiratory variability, suggesting right atrial pressure of 15 mmHg. IAS/Shunts: The interatrial septum was not well visualized.  TRICUSPID VALVE TR Peak grad:   21.3 mmHg TR Vmax:        231.00 cm/s Yvonne Kendall MD Electronically signed by Yvonne Kendall MD Signature Date/Time: 10/07/2020/7:03:35 AM    Final     (Echo, Carotid, EGD, Colonoscopy, ERCP)     Subjective: Seen and examined the day of discharge.  Reviewed results of negative stress test.  Patient weaned off submental oxygen and is at baseline volume status.  Stable for discharge home at this time.  Discharge Exam: Vitals:   10/09/20 0852 10/09/20 1324  BP: (!) 151/69 (!) 164/58  Pulse: 63 69  Resp: 18 19  Temp: 98.2 F (36.8 C) 98.4 F (36.9 C)  SpO2: 100% 97%   Vitals:   10/09/20 0345 10/09/20 0431 10/09/20 0852 10/09/20 1324  BP: 133/74  (!) 151/69 (!) 164/58  Pulse: 68  63 69  Resp: 19  18 19   Temp: 98 F (36.7 C)  98.2 F (36.8 C) 98.4 F (36.9 C)  TempSrc: Oral  Oral Oral  SpO2: 94%  100% 97%  Weight:  107.3 kg    Height:        General: Pt is alert, awake, not in acute distress Cardiovascular: RRR, S1/S2 +, no rubs, no gallops Respiratory:  CTA bilaterally, no wheezing, no rhonchi Abdominal: Soft, NT, ND, bowel sounds + Extremities: no edema, no cyanosis    The results of significant diagnostics from this hospitalization (including imaging, microbiology, ancillary and laboratory) are listed below for reference.     Microbiology: Recent Results (from the past 240 hour(s))  Resp Panel by RT-PCR (Flu A&B, Covid) Nasopharyngeal Swab     Status: None   Collection Time: 10/05/20  9:01 PM   Specimen: Nasopharyngeal Swab; Nasopharyngeal(NP) swabs in vial transport medium  Result Value Ref Range Status   SARS Coronavirus 2 by RT PCR NEGATIVE NEGATIVE Final    Comment: (NOTE) SARS-CoV-2 target nucleic acids are NOT DETECTED.  The SARS-CoV-2 RNA is generally detectable in upper respiratory specimens during the acute phase of infection. The lowest concentration of SARS-CoV-2 viral copies this assay can detect is 138 copies/mL. A negative result does not preclude SARS-Cov-2 infection and should not be used as the sole basis for treatment or other patient management decisions. A negative result may occur with  improper specimen collection/handling,  submission of specimen other than nasopharyngeal swab, presence of viral mutation(s) within the areas targeted by this assay, and inadequate number of viral copies(<138 copies/mL). A negative result must be combined with clinical observations, patient history, and epidemiological information. The expected result is Negative.  Fact Sheet for Patients:  BloggerCourse.com  Fact Sheet for Healthcare Providers:  SeriousBroker.it  This test is no t yet approved or cleared by the [place] FDA and  has been authorized for detection and/or diagnosis of SARS-CoV-2 by FDA under an Emergency Use Authorization (EUA). This EUA will remain  in effect (meaning this test can be used) for the duration of the COVID-19 declaration under Section 564(b)(1) of the Act, 21 U.S.C.section 360bbb-3(b)(1), unless the authorization is terminated  or revoked sooner.       Influenza A by PCR NEGATIVE NEGATIVE Final   Influenza B by PCR NEGATIVE NEGATIVE Final    Comment: (NOTE) The Xpert Xpress SARS-CoV-2/FLU/RSV plus assay is intended as an aid in the diagnosis of influenza from Nasopharyngeal swab specimens and should not be used as a sole basis for treatment. Nasal washings and aspirates are unacceptable for Xpert Xpress SARS-CoV-2/FLU/RSV testing.  Fact Sheet for Patients: BloggerCourse.com  Fact Sheet for Healthcare Providers: SeriousBroker.it  This test is not yet approved or cleared by the [place] FDA and has been authorized for detection and/or diagnosis of SARS-CoV-2 by FDA under an Emergency Use Authorization (EUA). This EUA will remain in effect (meaning this test can be used) for the duration of the COVID-19 declaration under Section 564(b)(1) of the Act, 21 U.S.C. section 360bbb-3(b)(1), unless the authorization is terminated or revoked.  Performed at [facility], [address] [place], [place] [address]      Labs: BNP (last 3 results) Recent Labs    10/05/20 1928  BNP 1,365.1*   Basic Metabolic Panel: Recent Labs  Lab 10/05/20 1928 10/06/20 0435 10/07/20 0518 10/08/20 0434 10/09/20 0803  NA 139 141 140 134* 136  K 5.7* 5.5* 4.8 4.0 3.8  CL 109 110 110 105 106  CO2 20* 21* 21* 20* 20*  GLUCOSE 305* 281* 213* 238* 189*  BUN 61* 61* 74* 83* 73*  CREATININE 2.50* 2.17* 2.09* 2.13* 1.79*  CALCIUM 8.8* 8.9 8.7* 8.5* 8.5*  MG  --  2.1  --   --   --    Liver Function Tests: Recent Labs  Lab 10/05/20 1928 10/06/20 0435  AST 45* 36  ALT 68* 62*  ALKPHOS 115 107  BILITOT 1.5* 1.3*  PROT 8.2* 7.6  ALBUMIN 3.9 3.6   No results for input(s): LIPASE, AMYLASE in the last 168 hours. No results for input(s): AMMONIA in the last 168 hours. CBC: Recent Labs  Lab 10/05/20 1928 10/06/20 0435 10/07/20 0518 10/08/20 0434 10/09/20 0803  WBC 8.7 8.8 11.3* 8.1 5.8  NEUTROABS 7.5  --  9.2*  --  3.7  HGB 8.8* 8.9* 8.0* 8.4* 8.4*  HCT 30.3* 28.7* 27.2* 27.9* 28.1*  MCV 87.6 85.2 86.1 84.3 83.1  PLT 221 196 212 228 233   Cardiac Enzymes: No results for input(s): CKTOTAL, CKMB, CKMBINDEX, TROPONINI in the last 168 hours. BNP: Invalid input(s): POCBNP CBG: Recent Labs  Lab 10/08/20 1206 10/08/20 1626 10/08/20 2034 10/09/20 0851 10/09/20 1323  GLUCAP 248* 232* 210* 181* 272*   D-Dimer No results for input(s): DDIMER in the last 72 hours. Hgb A1c No results for input(s): HGBA1C in the last 72 hours. Lipid Profile No results for input(s): CHOL, HDL, LDLCALC, TRIG, CHOLHDL, LDLDIRECT in the last 72 hours. Thyroid function studies No results for input(s): TSH, T4TOTAL, T3FREE, THYROIDAB in the last 72 hours.  Invalid input(s): FREET3 Anemia work up No results for input(s): VITAMINB12, FOLATE, FERRITIN, TIBC, IRON, RETICCTPCT in the last 72 hours. Urinalysis No results found for: COLORURINE, APPEARANCEUR, LABSPEC, PHURINE,  GLUCOSEU, HGBUR, BILIRUBINUR, KETONESUR, PROTEINUR, UROBILINOGEN, NITRITE, LEUKOCYTESUR Sepsis Labs Invalid input(s): PROCALCITONIN,  WBC,  LACTICIDVEN Microbiology Recent Results (from the past 240 hour(s))  Resp Panel by RT-PCR (Flu A&B, Covid) Nasopharyngeal Swab     Status: None   Collection Time: 10/05/20  9:01 PM   Specimen: Nasopharyngeal Swab; Nasopharyngeal(NP) swabs in vial transport medium  Result Value Ref Range Status   SARS Coronavirus 2 by RT PCR NEGATIVE NEGATIVE Final    Comment: (NOTE) SARS-CoV-2 target nucleic acids are NOT DETECTED.  The SARS-CoV-2 RNA is generally detectable in upper respiratory specimens during the acute phase of infection. The lowest concentration of SARS-CoV-2 viral copies this assay can detect is 138 copies/mL. A negative result does not preclude SARS-Cov-2 infection and should not be used as the sole basis for treatment or other patient management decisions. A negative result may occur with  improper specimen collection/handling, submission of specimen other than nasopharyngeal swab, presence of viral mutation(s) within the areas targeted by this assay, and inadequate number of viral copies(<138 copies/mL). A negative result must be combined with clinical observations, patient history, and epidemiological information. The expected result is Negative.  Fact Sheet for Patients:  BloggerCourse.com  Fact Sheet for Healthcare Providers:  SeriousBroker.it  This test is no t yet approved or cleared by the [place] FDA and  has been authorized for detection and/or diagnosis of SARS-CoV-2 by FDA under an Emergency Use Authorization (EUA). This EUA will remain  in effect (meaning this test can be used) for the duration of the COVID-19 declaration under Section 564(b)(1) of the Act, 21 U.S.C.section 360bbb-3(b)(1), unless the authorization is terminated  or revoked sooner.       Influenza A  by PCR NEGATIVE NEGATIVE Final   Influenza B by PCR NEGATIVE NEGATIVE Final    Comment: (NOTE) The Xpert Xpress SARS-CoV-2/FLU/RSV plus assay is intended as an aid in the diagnosis of influenza from Nasopharyngeal swab specimens and should not be used as a sole basis for treatment. Nasal washings and aspirates are unacceptable for Xpert Xpress SARS-CoV-2/FLU/RSV testing.  Fact Sheet for Patients:  BloggerCourse.com  Fact Sheet for Healthcare Providers: SeriousBroker.it  This test is not yet approved or cleared by the [place] FDA and has been authorized for detection and/or diagnosis of SARS-CoV-2 by FDA under an Emergency Use Authorization (EUA). This EUA will remain in effect (meaning this test can be used) for the duration of the COVID-19 declaration under Section 564(b)(1) of the Act, 21 U.S.C. section 360bbb-3(b)(1), unless the authorization is terminated or revoked.  Performed at [facility], [address] [place], [place] [address]      Time coordinating discharge: Over 30 minutes  SIGNED:   Tresa Moore, MD  [facility] 10/09/2020, 2:56 PM Pager   If 7PM-7AM, please contact night-coverage

## 2020-10-09 NOTE — Progress Notes (Signed)
Progress Note  Patient Name: Anthony Fox Date of Encounter: 10/09/2020  Primary Cardiologist: New, Dr. Okey Dupre  Subjective   Reports improvement in shortness of breath, lower extremity edema, and overall status.  No chest pain, racing heart rate, or palpitations.  Evaluated today during stress test.  Inpatient Medications    Scheduled Meds: . amLODipine  10 mg Oral Daily  . aspirin EC  81 mg Oral Daily  . atorvastatin  40 mg Oral Daily  . carvedilol  25 mg Oral BID WC  . ferrous sulfate  325 mg Oral BID WC  . furosemide  40 mg Intravenous Daily  . hydrALAZINE  50 mg Oral BID  . insulin aspart  0-15 Units Subcutaneous TID WC  . insulin aspart  0-5 Units Subcutaneous QHS  . insulin detemir  20 Units Subcutaneous Daily  . patiromer  16.8 g Oral Daily  . pneumococcal 23 valent vaccine  0.5 mL Intramuscular Tomorrow-1000  . vitamin B-12  1,000 mcg Oral Daily   Continuous Infusions: . heparin 1,700 Units/hr (10/09/20 0329)   PRN Meds: acetaminophen **OR** acetaminophen, ondansetron **OR** ondansetron (ZOFRAN) IV   Vital Signs    Vitals:   10/08/20 1953 10/09/20 0345 10/09/20 0431 10/09/20 0852  BP: 130/71 133/74  (!) 151/69  Pulse: 63 68  63  Resp:  19  18  Temp: 98 F (36.7 C) 98 F (36.7 C)  98.2 F (36.8 C)  TempSrc: Oral Oral  Oral  SpO2: 93% 94%  100%  Weight:   107.3 kg   Height:        Intake/Output Summary (Last 24 hours) at 10/09/2020 1210 Last data filed at 10/09/2020 0849 Gross per 24 hour  Intake 709.18 ml  Output 2650 ml  Net -1940.82 ml   Last 3 Weights 10/09/2020 10/08/2020 10/07/2020  Weight (lbs) 236 lb 9.6 oz 241 lb 1.6 oz 234 lb  Weight (kg) 107.321 kg 109.362 kg 106.142 kg      Telemetry    NSR during stress test, previously atrial fibrillation- Personally Reviewed  ECG    No new tracings- Personally Reviewed  Physical Exam   GEN: No acute distress.   Neck: No JVD Cardiac: RRR, no murmurs, rubs, or gallops.  Respiratory:   Right basilar crackles, no left basilar crackles appreciated. GI: Soft, nontender, non-distended  MS: No edema; No deformity. Neuro:  Nonfocal  Psych: Normal affect   Labs    High Sensitivity Troponin:   Recent Labs  Lab 10/05/20 1928 10/05/20 2212 10/06/20 0027  TROPONINIHS 466* 1,230* 903*      Chemistry Recent Labs  Lab 10/05/20 1928 10/06/20 0435 10/07/20 0518 10/08/20 0434 10/09/20 0803  NA 139 141 140 134* 136  K 5.7* 5.5* 4.8 4.0 3.8  CL 109 110 110 105 106  CO2 20* 21* 21* 20* 20*  GLUCOSE 305* 281* 213* 238* 189*  BUN 61* 61* 74* 83* 73*  CREATININE 2.50* 2.17* 2.09* 2.13* 1.79*  CALCIUM 8.8* 8.9 8.7* 8.5* 8.5*  PROT 8.2* 7.6  --   --   --   ALBUMIN 3.9 3.6  --   --   --   AST 45* 36  --   --   --   ALT 68* 62*  --   --   --   ALKPHOS 115 107  --   --   --   BILITOT 1.5* 1.3*  --   --   --   GFRNONAA 27* 32* 34* 33* 41*  ANIONGAP 10 10 9 9 10      Hematology Recent Labs  Lab 10/07/20 0518 10/08/20 0434 10/09/20 0803  WBC 11.3* 8.1 5.8  RBC 3.16* 3.31* 3.38*  HGB 8.0* 8.4* 8.4*  HCT 27.2* 27.9* 28.1*  MCV 86.1 84.3 83.1  MCH 25.3* 25.4* 24.9*  MCHC 29.4* 30.1 29.9*  RDW 15.0 14.8 14.9  PLT 212 228 233    BNP Recent Labs  Lab 10/05/20 1928  BNP 1,365.1*     DDimer No results for input(s): DDIMER in the last 168 hours.   Radiology    NM Pulmonary Perfusion  Result Date: 10/07/2020 CLINICAL DATA:  Shortness of breath EXAM: NUCLEAR MEDICINE PERFUSION LUNG SCAN TECHNIQUE: Perfusion images were obtained in multiple projections after intravenous injection of radiopharmaceutical. Views: Anterior, posterior, left lateral, right lateral, RPO, LPO, RAO, LAO. RADIOPHARMACEUTICALS:  4.16 mCi Tc-19m MAA IV COMPARISON:  Chest radiograph October 07, 2020 FINDINGS: Radiotracer uptake is homogeneous and symmetric bilaterally. No perfusion defects evident. IMPRESSION: No perfusion defects evident. No findings indicative of pulmonary embolus.  Electronically Signed   By: October 09, 2020 III M.D.   On: 10/07/2020 15:03    Cardiac Studies   Echo 10/06/20 2D echo 10/06/2020: 1. Left ventricular ejection fraction, by estimation, is 55 to 60%. The  left ventricle has normal function. The left ventricle has no regional  wall motion abnormalities. There is mild left ventricular hypertrophy.  Left ventricular diastolic parameters  are indeterminate.  2. Right ventricular systolic function is moderately reduced. The right  ventricular size is mildly enlarged. There is mildly elevated pulmonary  artery systolic pressure.  3. Left atrial size was mildly dilated.  4. Right atrial size was mildly dilated.  5. A small pericardial effusion is present.  6. The mitral valve is normal in structure. Mild mitral valve  regurgitation. No evidence of mitral stenosis.  7. Tricuspid valve regurgitation is mild to moderate.  8. The aortic valve is tricuspid. Aortic valve regurgitation is not  visualized. No aortic stenosis is present.  9. The inferior vena cava is dilated in size with <50% respiratory  variability, suggesting right atrial pressure of 15 mmHg.  Patient Profile     69 y.o. male with history of PAD s/p extensive interventions as outlined in his consult note including left transmetatarsal and right first and second toe amputations, atrial fibrillation with Plavix followed by [facility], IDDM 2, CKD stage III with recurrent hyperkalemia, hypertension, hyperlipidemia, anemia of chronic disease, prior tobacco use, and seen today for the evaluation of dyspnea and elevated troponin.    Assessment & Plan    Acute on chronic hypoxic respiratory distress Acute HFpEF --Continues to note improvement in shortness of breath with IV diuresis. Diuresis was reduced yesterday with improved renal function today. Echo as above. Ddimer was elevated. As previously noted, unable to proceed with chest CTA secondary to underlying renal dysfunction.  VQ  scan with low probability for PE  Continue IV diuresis with lasix 40mg  daily. Sill volume up on exam with R base crackles.  Continue to monitor I's/O's, daily standing weights. Goal output -2.0L/day.  Net -2L yesterday. Wt 109.4kg  107.3kg.  Daily BMET. Renal function improving.  Cr 2.13  1.79 with BUN 83  73.  Continue current medications.   Elevated HS Tn --No reported chest pain.  High-sensitivity troponin peaked at 1200, downtrending. EKG without acute ST/T changes. Considered is supply demand ischemia in the setting of volume overload and RVR. Did report significant exertional dyspnea with multiple  risk factors for CAD, including IDDM, prior tobacco use, hypertension, hyperlipidemia, male, obesity, and age.  Given risk factors, further ischemic evaluation with MPI performed today and pending official results. Echo as above with nl EF, NRWMA, mild LVH, moderately reduced RVSF, and findings as above. MPI performed today and pending.   Continue ASA, BB, statin.  Can discontinue IV heparin today, given s/p 72 hours of medical therapy.   Lexiscan Myoview performed today, pending review, with further recommendations if indicated at that time.  Aggressive risk factor modification recommended.  PAF  --Denies tachypalpitations today, though NSR at time of evaluation.  Reportedly asymptomatic when in atrial fibrillation. [facility] documents h/o Afib with no clear evidence on EKGs available.  Atrial fibrillation noted on telemetry 10/07/2020. CHA2DS2VASc score of at least 4 (CHF, HTN, age x1, DM2).  NSR with previous Afib on telemetry. Rate controlled.   Continue current BB.  As above, as 72h s/p IV heparin, can transition over the oral anticoagulation at this time given CHA2DS2VASc score of 4 above.   HTN  BP still suboptimal at 151/69, though before medications today. Recommend recheck BP s/p AM medications.  Continue Coreg, amlodipine, hydralazine, IV lasix.    Holding Lisinopril due  to AKI and hyperkalemia   Holding chlorthalidone as on IV lasix.    For further BP support, as previously noted, could titrate hydralazine to 100mg  daily.  HLD  Continue statin.   AOCKD with recurrent hyperkalemia --Renal function improving. S/p Veltassa with improved K. Monitor closely. Avoid spironolactone.  IDDM --SSI. Per IM.  Anemia of chronic disease, iron deficiency --Daily CBC.  Continue to monitor on heparin.   For questions or updates, please contact [facility] Please consult [email or web address] for contact info under        Signed, , PA-C  10/09/2020, 12:10 PM

## 2020-10-09 NOTE — Progress Notes (Signed)
    Heart Failure Nurse Navigator Note  HFpEF 55-60%.  Right ventricular systolic function is moderately reduced.  Mild Bi atrial enlargement. Mild to moderate tricuspid regurgitation.   He presented with 3 to 4 month history of progression of shortness of breath, chest tightness, lower extremity edema and abdominal swelling,  Co morbidities:  Insulin dependent diabetes Paroxysmal atrial fibrillation Chronic kidney disease stage III Hypertension Hyperlipidemia Anemai    Medications:  Amlodipine 10 mg daily Aspirin 81 mg daily Lipitor 40 mg daily Coreg 25 mg twice day Furosemide 40 mg IV very 12 hours Hydralazine 50 mg  2 times a day    Labs:  Sodium 136, potassium 3.8,chloride 106, co2 20, BUN 73 (83), creatinine 1.79(2.13),WBS 5.8, hemoglobin 8.4, hematocrit 28.1 Intake 949 ml Output 2951 ml BMI 32.22 Blood pressure  164/58 Weight 107.3 kg (109)   Assessment:  General-- he is awake and alert,denies any SOB or CP.  HEENT- pupils are equal, no JVD.  Cardiac-regular rate and rhythm.  Chest- breath sounds clear to posterior  Abdomen- soft non tender  Musculoskeletal - no edema  Psych- is pleasant and appropriate, makes good eye contact.  Neuro- speech is clear, moves all extremities with out difficulty.    Spoke with patient today, he states his breathing is much improved.  Discussed heart failure and how is going to take care of himself once he goes home.  By teach back method he discussed weighing daily, recording and low sodium diet.  Reds Clip vest reading today of 30.   Tresa Endo RN, CHFN

## 2020-10-09 NOTE — Consult Note (Signed)
ANTICOAGULATION CONSULT NOTE  Pharmacy Consult for Heparin Indication: chest pain/ACS  Allergies  Allergen Reactions  . Minocycline Rash    Patient Measurements: Height: 6\' 1"  (185.4 cm) Weight: 107.3 kg (236 lb 9.6 oz) IBW/kg (Calculated) : 79.9 Heparin Dosing Weight: 99.9 kg  Vital Signs: Temp: 98 F (36.7 C) (12/17 0345) Temp Source: Oral (12/17 0345) BP: 133/74 (12/17 0345) Pulse Rate: 68 (12/17 0345)  Labs: Recent Labs    10/07/20 0200 10/07/20 0518 10/08/20 0434 10/09/20 0559  HGB  --  8.0* 8.4*  --   HCT  --  27.2* 27.9*  --   PLT  --  212 228  --   HEPARINUNFRC 0.51  --  0.39 0.45  CREATININE  --  2.09* 2.13*  --     Estimated Creatinine Clearance: 42.7 mL/min (A) (by C-G formula based on SCr of 2.13 mg/dL (H)).   Medical History: Past Medical History:  Diagnosis Date  . Anemia due to chronic kidney disease   . CKD (chronic kidney disease), stage III (HCC)   . Hyperlipidemia associated with type 2 diabetes mellitus (HCC)   . Hypertension associated with diabetes (HCC)   . Insulin dependent type 2 diabetes mellitus (HCC)   . PAD (peripheral artery disease) (HCC)     Medications:  Medications Prior to Admission  Medication Sig Dispense Refill Last Dose  . atorvastatin (LIPITOR) 40 MG tablet Take 40 mg by mouth at bedtime.   10/06/2020 at Unknown time  . amLODipine (NORVASC) 10 MG tablet Take 10 mg by mouth daily.     10/08/2020 aspirin 81 MG chewable tablet Chew 81 mg by mouth daily.     . carvedilol (COREG) 25 MG tablet Take 25 mg by mouth 2 (two) times daily.     . chlorthalidone (HYGROTON) 50 MG tablet Take 50 mg by mouth daily.     . clopidogrel (PLAVIX) 75 MG tablet Take 75 mg by mouth daily.     . ferrous sulfate 325 (65 FE) MG tablet Take 325 mg by mouth daily with breakfast.     . gabapentin (NEURONTIN) 300 MG capsule Take 300 mg by mouth at bedtime.     . hydrALAZINE (APRESOLINE) 50 MG tablet Take 50 mg by mouth 2 (two) times daily.     Marland Kitchen  lisinopril (ZESTRIL) 5 MG tablet Take 10 mg by mouth daily.     . sodium zirconium cyclosilicate (LOKELMA) 10 g PACK packet Take 10 g by mouth every other day.      Scheduled:  Infusions:  PRN:  Anti-infectives (From admission, onward)   None      Assessment: Heparin Dosing Weight: 99.9 kg Pharmacy consulted for heparin for ACS. Trop elevated. No note of DOAC PTA.   Goal of Therapy:  Heparin level 0.3-0.7 units/ml Monitor platelets by anticoagulation protocol: Yes   12/14 0435 0.28, subtherapeutic 12/14 1200 0.29, subtherapeutic 12/14 2000 0.52, therapeutic.  12/15 0200 0.51, therapeutic 12/16 0434 0.39, therapeutic  12/17 0559 0.45, therapeutic  Plan:  Heparin level is therapeutic. Will continue heparin infusion at 1700 units/hr.  Will check anti-Xa daily with AM labs while therapeutic. CBC daily while on heparin.   Crystalmarie Yasin D 10/09/2020 7:04 AM

## 2020-10-09 NOTE — Progress Notes (Signed)
Inpatient Diabetes Program Recommendations  AACE/ADA: New Consensus Statement on Inpatient Glycemic Control   Target Ranges:  Prepandial:   less than 140 mg/dL      Peak postprandial:   less than 180 mg/dL (1-2 hours)      Critically ill patients:  140 - 180 mg/dL   Results for RIDGE, LAFOND (MRN [id number]) as of 10/09/2020 12:13  Ref. Range 10/08/2020 08:14 10/08/2020 12:06 10/08/2020 16:26 10/08/2020 20:34 10/09/2020 08:51  Glucose-Capillary Latest Ref Range: 70 - 99 mg/dL 888 (H) 916 (H) 945 (H) 210 (H) 181 (H)   Review of Glycemic Control  Diabetes history: DM2 Outpatient Diabetes medications: NPH 28 units BID Current orders for Inpatient glycemic control: Levemir 20 units daily, Novolog 0-15 units TID with meals, Novolog 0-5 units QHS  Inpatient Diabetes Program Recommendations:    Insulin: Note patient is NPO currently. Once diet resumed, please consider ordering Novolog 3 units TID with meals for meal coverage if patient eats at least 50% of meals.  Thanks, Orlando Penner, RN, MSN, CDE Diabetes Coordinator Inpatient Diabetes Program [phone] (Team Pager from 8am to 5pm)

## 2020-10-09 NOTE — Progress Notes (Signed)
Discharge instructions explained/pt verbalized understanding. IV and tele removed. Will transport off unit when ride arrives.  

## 2020-10-09 NOTE — Progress Notes (Signed)
Mobility Specialist - Progress Note   10/09/20 1024  Mobility  Activity Off unit  Mobility performed by Mobility specialist    Attempted session at this time. Per discussion w/ nurse, pt currently off unit. Will hold and re-attempt when pt is available.    Taurean Ju Mobility Specialist  10/09/20, 10:25 AM

## 2020-10-14 ENCOUNTER — Other Ambulatory Visit: Payer: Self-pay

## 2020-10-14 ENCOUNTER — Ambulatory Visit: Payer: Medicare HMO | Attending: Family | Admitting: Family

## 2020-10-14 ENCOUNTER — Encounter: Payer: Self-pay | Admitting: Family

## 2020-10-14 VITALS — BP 120/53 | HR 55 | Resp 18 | Ht 75.0 in | Wt 240.1 lb

## 2020-10-14 DIAGNOSIS — E1159 Type 2 diabetes mellitus with other circulatory complications: Secondary | ICD-10-CM

## 2020-10-14 DIAGNOSIS — Z7902 Long term (current) use of antithrombotics/antiplatelets: Secondary | ICD-10-CM | POA: Diagnosis not present

## 2020-10-14 DIAGNOSIS — E1122 Type 2 diabetes mellitus with diabetic chronic kidney disease: Secondary | ICD-10-CM | POA: Diagnosis not present

## 2020-10-14 DIAGNOSIS — N183 Chronic kidney disease, stage 3 unspecified: Secondary | ICD-10-CM | POA: Insufficient documentation

## 2020-10-14 DIAGNOSIS — Z794 Long term (current) use of insulin: Secondary | ICD-10-CM | POA: Diagnosis not present

## 2020-10-14 DIAGNOSIS — I130 Hypertensive heart and chronic kidney disease with heart failure and stage 1 through stage 4 chronic kidney disease, or unspecified chronic kidney disease: Secondary | ICD-10-CM | POA: Diagnosis not present

## 2020-10-14 DIAGNOSIS — Z7982 Long term (current) use of aspirin: Secondary | ICD-10-CM | POA: Insufficient documentation

## 2020-10-14 DIAGNOSIS — I509 Heart failure, unspecified: Secondary | ICD-10-CM | POA: Insufficient documentation

## 2020-10-14 DIAGNOSIS — IMO0002 Reserved for concepts with insufficient information to code with codable children: Secondary | ICD-10-CM

## 2020-10-14 DIAGNOSIS — E785 Hyperlipidemia, unspecified: Secondary | ICD-10-CM | POA: Diagnosis not present

## 2020-10-14 DIAGNOSIS — E1151 Type 2 diabetes mellitus with diabetic peripheral angiopathy without gangrene: Secondary | ICD-10-CM | POA: Diagnosis not present

## 2020-10-14 DIAGNOSIS — Z87891 Personal history of nicotine dependence: Secondary | ICD-10-CM | POA: Insufficient documentation

## 2020-10-14 DIAGNOSIS — Z8249 Family history of ischemic heart disease and other diseases of the circulatory system: Secondary | ICD-10-CM | POA: Diagnosis not present

## 2020-10-14 DIAGNOSIS — Z79899 Other long term (current) drug therapy: Secondary | ICD-10-CM | POA: Insufficient documentation

## 2020-10-14 DIAGNOSIS — I480 Paroxysmal atrial fibrillation: Secondary | ICD-10-CM

## 2020-10-14 DIAGNOSIS — I5032 Chronic diastolic (congestive) heart failure: Secondary | ICD-10-CM

## 2020-10-14 NOTE — Progress Notes (Signed)
Patient ID: Anthony Fox, male    DOB: Mar 07, 1951, 69 y.o.   MRN: [id number]  HPI  Anthony Fox is a 69 y/o male with a history of atrial fibrillation, DM, hyperlipidemia, HTN, CKD, PAD, anemia, PVD, previous tobacco use and chronic heart failure.   Echo report from 10/06/20 reviewed and showed an EF of 55-60% along with mild LVH, mildly elevated PA pressure, mild Anthony and mild/moderate TR.   Admitted 10/05/20 due to acute on chronic HF. Cardiology consult obtained. Initially given IV lasix with transition to oral diuretics. Needed bipap initially as well. Nuclear stress test negative for ischemia. Discharged after 4 days.   He presents today for his initial visit with a chief complaint of minimal shortness of breath upon moderate exertion. He describes this as having been present for several months. He has associated fatigue, pedal edema, abdominal distention and light-headedness along with this. He denies any difficulty sleeping, palpitations, chest pain, cough or head congestion.   Does not have scales at home. Currently wearing a compression sock on the left leg only. He's unsure of exactly how much fluid he is drinking but his wife feels like he's drinking too much fluids.   Past Medical History:  Diagnosis Date   Anemia due to chronic kidney disease    Arrhythmia    atrial fibrillation   CHF (congestive heart failure) (HCC)    CKD (chronic kidney disease), stage III (HCC)    Hyperlipidemia    Hyperlipidemia associated with type 2 diabetes mellitus (HCC)    Hypertension associated with diabetes (HCC)    Insulin dependent type 2 diabetes mellitus (HCC)    PAD (peripheral artery disease) (HCC)    PVD (peripheral vascular disease) (HCC)    Past Surgical History:  Procedure Laterality Date   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   Family History  Problem Relation Age of Onset   Diabetes Mother    Hypertension Mother    Diabetes Brother    Social History    Tobacco Use   Smoking status: Former Smoker   Smokeless tobacco: Never Used   Tobacco comment: 10/05/20: Quit many years ago  Substance Use Topics   Alcohol use: Never   Allergies  Allergen Reactions   Minocycline Rash   Prior to Admission medications   Medication Sig Start Date End Date Taking? Authorizing Provider  amLODipine (NORVASC) 10 MG tablet Take 10 mg by mouth daily. 07/30/20  Yes [provider]  aspirin 81 MG chewable tablet Chew 81 mg by mouth daily. 12/31/19  Yes [provider]  atorvastatin (LIPITOR) 40 MG tablet Take 40 mg by mouth at bedtime. 07/30/20  Yes [provider]  carvedilol (COREG) 25 MG tablet Take 25 mg by mouth 2 (two) times daily. 07/30/20  Yes [provider]  clopidogrel (PLAVIX) 75 MG tablet Take 75 mg by mouth daily. 07/30/20  Yes [provider]  ferrous sulfate 325 (65 FE) MG tablet Take 325 mg by mouth daily with breakfast.   Yes [provider]  furosemide (LASIX) 40 MG tablet Take 1 tablet (40 mg total) by mouth daily. 10/10/20 11/09/20 Yes Sreenath, Sudheer B, MD  gabapentin (NEURONTIN) 300 MG capsule Take 300 mg by mouth at bedtime. 09/22/20  Yes [provider]  hydrALAZINE (APRESOLINE) 50 MG tablet Take 50 mg by mouth 2 (two) times daily. 07/30/20  Yes [provider]  lisinopril (ZESTRIL) 5 MG tablet Take 5 mg by mouth daily. 09/16/20  Yes [provider]  sodium  zirconium cyclosilicate (LOKELMA) 10 g PACK packet Take 10 g by mouth every other day.   Yes [provider]    Review of Systems  Constitutional: Positive for fatigue. Negative for appetite change.  HENT: Negative for congestion, rhinorrhea and sore throat.   Eyes: Negative.   Respiratory: Positive for shortness of breath (improving). Negative for cough and chest tightness.   Cardiovascular: Positive for leg swelling (improving). Negative for chest pain and palpitations.  Gastrointestinal:  Positive for abdominal distention. Negative for abdominal pain.  Endocrine: Negative.   Genitourinary: Negative.   Musculoskeletal: Negative for back pain and neck pain.  Skin: Negative.   Allergic/Immunologic: Negative.   Neurological: Positive for light-headedness (when changing positions too quickly). Negative for dizziness.  Hematological: Negative for adenopathy. Does not bruise/bleed easily.  Psychiatric/Behavioral: Negative for dysphoric mood and sleep disturbance (sleeping on 2 pillows). The patient is not nervous/anxious.     Vitals:   10/14/20 1239  BP: (!) 120/53  Pulse: (!) 55  Resp: 18  SpO2: 93%  Weight: 240 lb 2 oz (108.9 kg)  Height: 6\' 3"  (1.905 m)   Wt Readings from Last 3 Encounters:  10/14/20 240 lb 2 oz (108.9 kg)  10/09/20 236 lb 9.6 oz (107.3 kg)   Lab Results  Component Value Date   CREATININE 1.79 (H) 10/09/2020   CREATININE 2.13 (H) 10/08/2020   CREATININE 2.09 (H) 10/07/2020    Physical Exam Vitals and nursing note reviewed. Exam conducted with a chaperone present (wife is present).  Constitutional:      Appearance: Normal appearance.  HENT:     Head: Normocephalic and atraumatic.  Cardiovascular:     Rate and Rhythm: Bradycardia present. Rhythm irregular.  Pulmonary:     Effort: Pulmonary effort is normal. No respiratory distress.     Breath sounds: No wheezing or rales.  Abdominal:     General: There is no distension.     Palpations: Abdomen is soft.  Musculoskeletal:        General: No tenderness.     Cervical back: Normal range of motion and neck supple.     Right lower leg: Edema (1+ pitting) present.     Left lower leg: Edema (1+ pitting) present.  Skin:    General: Skin is warm and dry.  Neurological:     General: No focal deficit present.     Mental Status: He is alert and oriented to person, place, and time.  Psychiatric:        Mood and Affect: Mood normal.        Behavior: Behavior normal.        Thought Content: Thought  content normal.    Assessment & Plan:  1: Chronic heart failure with preserved ejection fraction along with structural changes (LVH)- - NYHA class II - euvolemic today - set of scales given to him and he was instructed to weigh every morning, write the weight down and call for an overnight weight gain of > 2 pounds or a weekly weight gain of > 5 pounds - not adding salt and has been trying to read labels for sodium content; reviewed the importance of keeping daily sodium intake to < 2000mg  / day and written dietary information was provided about this - instructed to wear compression socks on both legs and to elevate his legs when sitting for long periods of time - advised to drink between 60-64 ounces of fluid/ day (wife feels like he's drinking too much fluids) - saw  cardiology (Dunn) during recent admission & f/u appt scheduled with his office on 10/19/20 - could consider titrating up furosemide or changing to torsemide if edema persists but need to be mindful of renal function - BNP 10/05/20 was 1365.1 - has received his flu vaccine for this season - has received both covid vaccines along with covid booster  2: HTN- - BP looks good today - had telemedicine visit with PCP Fredia Beets) 03/05/20; instructed to schedule f/u appt with them or change to local PCP - BMP 10/09/20 reviewed and showed sodium 136, potassium 3.8, creatinine 1.79 and GFR 41   3: DM- - saw nephrology Austin Miles) 09/21/20 & returns 11/03/20 - A1c 10/06/20 was 8.1% - reports glucose at home today was 250  4: Atrial fibrillation- - irregular today - on clopidogrel and aspirion - bradycardic today   Patient did not bring his medications nor a list. Each medication was verbally reviewed with the patient and he was encouraged to bring the bottles to every visit to confirm accuracy of list.  Return in 2 months or sooner for any questions/problems before then.

## 2020-10-14 NOTE — Patient Instructions (Addendum)
Begin weighing daily and call for an overnight weight gain of > 2 pounds or a weekly weight gain of >5 pounds.   Drink between 60-64 ounces of fluids daily.

## 2020-10-19 ENCOUNTER — Other Ambulatory Visit: Payer: Self-pay

## 2020-10-19 ENCOUNTER — Ambulatory Visit (INDEPENDENT_AMBULATORY_CARE_PROVIDER_SITE_OTHER): Payer: Medicare HMO | Admitting: Cardiology

## 2020-10-19 ENCOUNTER — Encounter: Payer: Self-pay | Admitting: Cardiology

## 2020-10-19 VITALS — BP 136/60 | HR 59 | Ht 75.0 in | Wt 243.0 lb

## 2020-10-19 DIAGNOSIS — I10 Essential (primary) hypertension: Secondary | ICD-10-CM | POA: Diagnosis not present

## 2020-10-19 DIAGNOSIS — I5030 Unspecified diastolic (congestive) heart failure: Secondary | ICD-10-CM | POA: Diagnosis not present

## 2020-10-19 DIAGNOSIS — I480 Paroxysmal atrial fibrillation: Secondary | ICD-10-CM | POA: Diagnosis not present

## 2020-10-19 DIAGNOSIS — I739 Peripheral vascular disease, unspecified: Secondary | ICD-10-CM | POA: Diagnosis not present

## 2020-10-19 MED ORDER — TORSEMIDE 20 MG PO TABS: ORAL_TABLET | ORAL | 5 refills | Status: DC

## 2020-10-19 NOTE — Patient Instructions (Signed)
Medication Instructions:   1.  STOP taking your Lasix (Furosemide). 2.  START taking Torsemide 40 MG: Take two tablets by mouth once a day for 5 days, then take 1 tablet by mouth daily.  *If you need a refill on your cardiac medications before your next appointment, please call your pharmacy*   Lab Work: Your physician recommends that you return for lab work in: BMP in 7 days.   - Please go to the [facility]. You will check in at the front desk to the right as you walk into the atrium. Valet Parking is offered if needed. - No appointment needed. You may go any day between 7 am and 6 pm.  Testing/Procedures: None Ordered   Follow-Up: At [facility], you and your health needs are our priority.  As part of our continuing mission to provide you with exceptional heart care, we have created designated Provider Care Teams.  These Care Teams include your primary Cardiologist (physician) and Advanced Practice Providers (APPs -  Physician Assistants and Nurse Practitioners) who all work together to provide you with the care you need, when you need it.  We recommend signing up for the patient portal called "MyChart".  Sign up information is provided on this After Visit Summary.  MyChart is used to connect with patients for Virtual Visits (Telemedicine).  Patients are able to view lab/test results, encounter notes, upcoming appointments, etc.  Non-urgent messages can be sent to your provider as well.   To learn more about what you can do with MyChart, go to [email or web address].    Your next appointment:   2 week(s)  The format for your next appointment:   In Person  Provider:   Debbe Odea, MD or APP   Other Instructions

## 2020-10-19 NOTE — Progress Notes (Signed)
Cardiology Office Note:    Date:  10/19/2020   ID:  Anthony Fox, DOB Mar 10, 1951, MRN [id number]  PCP:  Karrie Doffing, PA  [facility] Cardiologist:  No primary care provider on file.  [facility] Electrophysiologist:  None   Referring MD: No ref. provider found   Chief Complaint  Patient presents with  . Other    [facility] CHF/hypoxia c/o edema/swelling ABD and legs. Meds reviewed verbally with pt.    History of Present Illness:    Anthony Fox is a 69 y.o. male with a hx of HFpEF, PAD (left metatarsal amputation, right first and second toe amputation), diabetes, paroxysmal A. fib, CKD 3 who presents for follow-up.  Patient had a recent admission on 10/06/2020 due to shortness of breath, diagnosed with volume overload and heart failure.  VQ scan was negative for PE.  Managed with IV Lasix with improvement in symptoms.  Nuclear stress test 12/17 with no evidence for ischemia.  Echo 10/06/2020 showed preserved EF, 55 to 60%, mild LVH, mild LAE.  Due to high risk of bleeding, anemia, anticoagulation not recommended upon discharge.  Aspirin, Plavix was continued.  IV Lasix transition to 40 mg orally.  Weight at time of discharge was 236 pounds.  He states having abdominal fullness and also shortness of breath and wheezing when he bends over or tries to lay on his back.  Endorses lower extremity edema since discharge, worse on his left leg.  Past Medical History:  Diagnosis Date  . Anemia due to chronic kidney disease   . Arrhythmia    atrial fibrillation  . CHF (congestive heart failure) (HCC)   . CKD (chronic kidney disease), stage III (HCC)   . Hyperlipidemia   . Hyperlipidemia associated with type 2 diabetes mellitus (HCC)   . Hypertension associated with diabetes (HCC)   . Insulin dependent type 2 diabetes mellitus (HCC)   . PAD (peripheral artery disease) (HCC)   . PVD (peripheral vascular disease) (HCC)     Past Surgical History:  Procedure Laterality Date  .  PERIPHERAL ARTERIAL STENT GRAFT Bilateral   . 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    Current Medications: Current Meds  Medication Sig  . amLODipine (NORVASC) 10 MG tablet Take 10 mg by mouth daily.  Marland Kitchen aspirin 81 MG chewable tablet Chew 81 mg by mouth daily.  Marland Kitchen atorvastatin (LIPITOR) 40 MG tablet Take 40 mg by mouth at bedtime.  . carvedilol (COREG) 25 MG tablet Take 25 mg by mouth 2 (two) times daily.  . clopidogrel (PLAVIX) 75 MG tablet Take 75 mg by mouth daily.  . ferrous sulfate 325 (65 FE) MG tablet Take 325 mg by mouth daily with breakfast.  . gabapentin (NEURONTIN) 300 MG capsule Take 300 mg by mouth at bedtime.  . hydrALAZINE (APRESOLINE) 50 MG tablet Take 50 mg by mouth 2 (two) times daily.  Marland Kitchen lisinopril (ZESTRIL) 5 MG tablet Take 5 mg by mouth daily.  . sodium zirconium cyclosilicate (LOKELMA) 10 g PACK packet Take 10 g by mouth every other day.  . torsemide (DEMADEX) 20 MG tablet Take 2 tablets by mouth for 5 days, then take 1 tablet by mouth daily.  . [DISCONTINUED] furosemide (LASIX) 40 MG tablet Take 1 tablet (40 mg total) by mouth daily.     Allergies:   Minocycline   Social History   Socioeconomic History  . Marital status: Married    Spouse name: Not on file  . Number of children: Not on file  .  Years of education: Not on file  . Highest education level: Not on file  Occupational History  . Not on file  Tobacco Use  . Smoking status: Former Smoker    Years: 1.00  . Smokeless tobacco: Never Used  . Tobacco comment: 10/05/20: Quit many years ago  Substance and Sexual Activity  . Alcohol use: Never  . Drug use: Never  . Sexual activity: Not on file  Other Topics Concern  . Not on file  Social History Narrative  . Not on file   Social Determinants of Health   Financial Resource Strain: Not on file  Food Insecurity: Not on file  Transportation Needs: Not on file  Physical Activity: Not on file  Stress: Not on file  Social Connections: Not on  file     Family History: The patient's family history includes Diabetes in his brother and mother; Hypertension in his mother.  ROS:   Please see the history of present illness.     All other systems reviewed and are negative.  EKGs/Labs/Other Studies Reviewed:    The following studies were reviewed today:   EKG:  EKG is  ordered today.  The ekg ordered today demonstrates sinus bradycardia, otherwise normal ECG  Recent Labs: 10/05/2020: B Natriuretic Peptide 1,365.1 10/06/2020: ALT 62; Magnesium 2.1; TSH 0.451 10/09/2020: BUN 73; Creatinine, Ser 1.79; Hemoglobin 8.4; Platelets 233; Potassium 3.8; Sodium 136  Recent Lipid Panel    Component Value Date/Time   CHOL 101 10/06/2020 0435   TRIG 62 10/06/2020 0435   HDL 28 (L) 10/06/2020 0435   CHOLHDL 3.6 10/06/2020 0435   VLDL 12 10/06/2020 0435   LDLCALC 61 10/06/2020 0435     Risk Assessment/Calculations:      Physical Exam:    VS:  BP 136/60 (BP Location: Right Arm, Patient Position: Sitting, Cuff Size: Normal)   Pulse (!) 59   Ht 6\' 3"  (1.905 m)   Wt 243 lb (110.2 kg)   SpO2 96%   BMI 30.37 kg/m     Wt Readings from Last 3 Encounters:  10/19/20 243 lb (110.2 kg)  10/14/20 240 lb 2 oz (108.9 kg)  10/09/20 236 lb 9.6 oz (107.3 kg)     GEN:  Well nourished, well developed in no acute distress HEENT: Normal NECK: No JVD; No carotid bruits LYMPHATICS: No lymphadenopathy CARDIAC: RRR, no murmurs, rubs, gallops RESPIRATORY:  Clear to auscultation without rales, wheezing or rhonchi  ABDOMEN: Soft, non-tender, distended MUSCULOSKELETAL:  1+ edema SKIN: Warm and dry NEUROLOGIC:  Alert and oriented x 3 PSYCHIATRIC:  Normal affect   ASSESSMENT:    1. Heart failure with preserved ejection fraction, unspecified HF chronicity (HCC)   2. PAF (paroxysmal atrial fibrillation) (HCC)   3. PAD (peripheral artery disease) (HCC)   4. Primary hypertension    PLAN:    In order of problems listed above:  1. Patient  with heart failure preserved EF, currently appears volume overloaded, weight gain, abdominal distention.  Stop Lasix, start torsemide 40 mg daily x5 days, decreased to 20 mg daily after.  Check BMP in 7 days.  Follow-up in 14 days. 2. Paroxysmal A. fib while hospitalized, brief episodes.  Due to anemia, anticoagulation not recommended.  Currently in sinus rhythm.  Continue Coreg. 3. PAD, aspirin, Plavix, Lipitor. 4. Hypertension, BP controlled.  Coreg, lisinopril, hydralazine as prescribed.  Follow-up in 2 weeks.  Titrate diuretics as needed.  Monitor for worsening renal function or abnormal electrolytes/potassium.  Medication Adjustments/Labs and Tests Ordered: Current medicines are reviewed at length with the patient today.  Concerns regarding medicines are outlined above.  Orders Placed This Encounter  Procedures  . Basic metabolic panel  . EKG 12-Lead   Meds ordered this encounter  Medications  . torsemide (DEMADEX) 20 MG tablet    Sig: Take 2 tablets by mouth for 5 days, then take 1 tablet by mouth daily.    Dispense:  40 tablet    Refill:  5    Patient Instructions  Medication Instructions:   1.  STOP taking your Lasix (Furosemide). 2.  START taking Torsemide 40 MG: Take two tablets by mouth once a day for 5 days, then take 1 tablet by mouth daily.  *If you need a refill on your cardiac medications before your next appointment, please call your pharmacy*   Lab Work: Your physician recommends that you return for lab work in: BMP in 7 days.   - Please go to the [facility]. You will check in at the front desk to the right as you walk into the atrium. Valet Parking is offered if needed. - No appointment needed. You may go any day between 7 am and 6 pm.  Testing/Procedures: None Ordered   Follow-Up: At [facility], you and your health needs are our priority.  As part of our continuing mission to provide you with exceptional heart care, we have created  designated Provider Care Teams.  These Care Teams include your primary Cardiologist (physician) and Advanced Practice Providers (APPs -  Physician Assistants and Nurse Practitioners) who all work together to provide you with the care you need, when you need it.  We recommend signing up for the patient portal called "MyChart".  Sign up information is provided on this After Visit Summary.  MyChart is used to connect with patients for Virtual Visits (Telemedicine).  Patients are able to view lab/test results, encounter notes, upcoming appointments, etc.  Non-urgent messages can be sent to your provider as well.   To learn more about what you can do with MyChart, go to [email or web address].    Your next appointment:   2 week(s)  The format for your next appointment:   In Person  Provider:   Debbe Odea, MD or APP   Other Instructions      Signed, Debbe Odea, MD  10/19/2020 4:44 PM    [facility]

## 2020-10-26 ENCOUNTER — Other Ambulatory Visit
Admission: RE | Admit: 2020-10-26 | Discharge: 2020-10-26 | Disposition: A | Discharge status: Home / Self Care | Payer: Medicare HMO | Source: Ambulatory Visit | Attending: Cardiology | Admitting: Cardiology

## 2020-10-26 DIAGNOSIS — I480 Paroxysmal atrial fibrillation: Secondary | ICD-10-CM | POA: Diagnosis not present

## 2020-10-26 DIAGNOSIS — I5030 Unspecified diastolic (congestive) heart failure: Secondary | ICD-10-CM | POA: Diagnosis not present

## 2020-10-26 LAB — BASIC METABOLIC PANEL
Anion gap: 10 (ref 5–15)
BUN: 64 mg/dL — ABNORMAL HIGH (ref 8–23)
CO2: 22 mmol/L (ref 22–32)
Calcium: 8.4 mg/dL — ABNORMAL LOW (ref 8.9–10.3)
Chloride: 105 mmol/L (ref 98–111)
Creatinine, Ser: 2.38 mg/dL — ABNORMAL HIGH (ref 0.61–1.24)
GFR, Estimated: 29 mL/min — ABNORMAL LOW (ref >60)
Glucose, Bld: 250 mg/dL — ABNORMAL HIGH (ref 70–99)
Potassium: 4.3 mmol/L (ref 3.5–5.1)
Sodium: 137 mmol/L (ref 135–145)

## 2020-11-02 ENCOUNTER — Ambulatory Visit (INDEPENDENT_AMBULATORY_CARE_PROVIDER_SITE_OTHER): Payer: Medicare HMO | Admitting: Cardiology

## 2020-11-02 ENCOUNTER — Other Ambulatory Visit: Payer: Self-pay

## 2020-11-02 ENCOUNTER — Encounter: Payer: Self-pay | Admitting: Cardiology

## 2020-11-02 VITALS — BP 120/60 | HR 98 | Ht 75.0 in | Wt 243.0 lb

## 2020-11-02 DIAGNOSIS — I5030 Unspecified diastolic (congestive) heart failure: Secondary | ICD-10-CM | POA: Diagnosis not present

## 2020-11-02 DIAGNOSIS — I10 Essential (primary) hypertension: Secondary | ICD-10-CM

## 2020-11-02 DIAGNOSIS — I739 Peripheral vascular disease, unspecified: Secondary | ICD-10-CM | POA: Diagnosis not present

## 2020-11-02 DIAGNOSIS — I480 Paroxysmal atrial fibrillation: Secondary | ICD-10-CM | POA: Diagnosis not present

## 2020-11-02 MED ORDER — TORSEMIDE 20 MG PO TABS: 40.0000 mg | ORAL_TABLET | Freq: Every day | ORAL | 5 refills | Status: DC

## 2020-11-02 NOTE — Patient Instructions (Signed)
Medication Instructions:   Your physician has recommended you make the following change in your medication:   INCREASE your Torsemide to 40 MG: Take 2 tablets (20 MG each) once a day by mouth.  *If you need a refill on your cardiac medications before your next appointment, please call your pharmacy*   Lab Work:  Your physician recommends that you return for lab work in: 2 weeks - Please go to the [facility]. You will check in at the front desk to the right as you walk into the atrium. Valet Parking is offered if needed. - No appointment needed. You may go any day between 7 am and 6 pm.   Testing/Procedures: None Ordered   Follow-Up: At [facility], you and your health needs are our priority.  As part of our continuing mission to provide you with exceptional heart care, we have created designated Provider Care Teams.  These Care Teams include your primary Cardiologist (physician) and Advanced Practice Providers (APPs -  Physician Assistants and Nurse Practitioners) who all work together to provide you with the care you need, when you need it.  We recommend signing up for the patient portal called "MyChart".  Sign up information is provided on this After Visit Summary.  MyChart is used to connect with patients for Virtual Visits (Telemedicine).  Patients are able to view lab/test results, encounter notes, upcoming appointments, etc.  Non-urgent messages can be sent to your provider as well.   To learn more about what you can do with MyChart, go to [email or web address].    Your next appointment:   1 month(s)  The format for your next appointment:   In Person  Provider:   Debbe Odea, MD   Other Instructions

## 2020-11-02 NOTE — Progress Notes (Signed)
Cardiology Office Note:    Date:  11/02/2020   ID:  Anthony Fox, DOB 11-Apr-1951, MRN [id number]  PCP:  Karrie Doffing, PA  [facility] Cardiologist:  No primary care provider on file.  [facility] Electrophysiologist:  None   Referring MD: Karrie Doffing, PA   Chief Complaint  Patient presents with  . Follow-up    Patient reports still having swelling in LE's and abdomen.    History of Present Illness:    Anthony Fox is a 70 y.o. male with a hx of HFpEF, PAD (left metatarsal amputation, right first and second toe amputation), diabetes, paroxysmal A. fib, CKD 3 who presents for follow-up.  Previously seen for volume overload, torsemide 40 mg x 5 days, decrease to 20 mg daily prescribed.  Patient states being compliant with his medications.  He is voiding well, but still notices edema and abdominal distention.  BMP was obtained to evaluate renal function.  Overall he states his symptoms have been roughly the same.  Checks his blood pressure frequently, his weight has also been stable.  Prior notes Patient had an admission on 10/06/2020 due to shortness of breath, diagnosed with volume overload and heart failure.  VQ scan was negative for PE.  Managed with IV Lasix with improvement in symptoms.  Nuclear stress test 12/17 with no evidence for ischemia.  Echo 10/06/2020 showed preserved EF, 55 to 60%, mild LVH, mild LAE.  Due to high risk of bleeding, anemia, anticoagulation not recommended upon discharge.  Aspirin, Plavix was continued.  IV Lasix transition to 40 mg orally.  Weight at time of discharge was 236 pounds.  He states having abdominal fullness and also shortness of breath and wheezing when he bends over or tries to lay on his back.  Endorses lower extremity edema since discharge, worse on his left leg.  Past Medical History:  Diagnosis Date  . Anemia due to chronic kidney disease   . Arrhythmia    atrial fibrillation  . CHF (congestive heart failure)  (HCC)   . CKD (chronic kidney disease), stage III (HCC)   . Hyperlipidemia   . Hyperlipidemia associated with type 2 diabetes mellitus (HCC)   . Hypertension associated with diabetes (HCC)   . Insulin dependent type 2 diabetes mellitus (HCC)   . PAD (peripheral artery disease) (HCC)   . PVD (peripheral vascular disease) (HCC)     Past Surgical History:  Procedure Laterality Date  . PERIPHERAL ARTERIAL STENT GRAFT Bilateral   . 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    Current Medications: Current Meds  Medication Sig  . amLODipine (NORVASC) 10 MG tablet Take 10 mg by mouth daily.  Marland Kitchen aspirin 81 MG chewable tablet Chew 81 mg by mouth daily.  Marland Kitchen atorvastatin (LIPITOR) 40 MG tablet Take 40 mg by mouth at bedtime.  . carvedilol (COREG) 25 MG tablet Take 25 mg by mouth 2 (two) times daily.  . clopidogrel (PLAVIX) 75 MG tablet Take 75 mg by mouth daily.  . ferrous sulfate 325 (65 FE) MG tablet Take 325 mg by mouth daily with breakfast.  . gabapentin (NEURONTIN) 300 MG capsule Take 300 mg by mouth at bedtime.  . hydrALAZINE (APRESOLINE) 50 MG tablet Take 50 mg by mouth 2 (two) times daily.  . insulin NPH Human (NOVOLIN N) 100 UNIT/ML injection Inject 28 units subcutaneously in the morning and 28 units in the evening  . lisinopril (ZESTRIL) 5 MG tablet Take 5 mg by mouth daily.  . sodium zirconium cyclosilicate (LOKELMA)  10 g PACK packet Take 10 g by mouth every other day.  . [DISCONTINUED] torsemide (DEMADEX) 20 MG tablet Take 20 mg by mouth daily.     Allergies:   Minocycline   Social History   Socioeconomic History  . Marital status: Married    Spouse name: Not on file  . Number of children: Not on file  . Years of education: Not on file  . Highest education level: Not on file  Occupational History  . Not on file  Tobacco Use  . Smoking status: Former Smoker    Years: 1.00  . Smokeless tobacco: Never Used  . Tobacco comment: 10/05/20: Quit many years ago  Vaping Use  .  Vaping Use: Never used  Substance and Sexual Activity  . Alcohol use: Never  . Drug use: Never  . Sexual activity: Not on file  Other Topics Concern  . Not on file  Social History Narrative  . Not on file   Social Determinants of Health   Financial Resource Strain: Not on file  Food Insecurity: Not on file  Transportation Needs: Not on file  Physical Activity: Not on file  Stress: Not on file  Social Connections: Not on file     Family History: The patient's family history includes Diabetes in his brother and mother; Hypertension in his mother.  ROS:   Please see the history of present illness.     All other systems reviewed and are negative.  EKGs/Labs/Other Studies Reviewed:    The following studies were reviewed today:   EKG:  EKG not  ordered today.   Recent Labs: 10/05/2020: B Natriuretic Peptide 1,365.1 10/06/2020: ALT 62; Magnesium 2.1; TSH 0.451 10/09/2020: Hemoglobin 8.4; Platelets 233 10/26/2020: BUN 64; Creatinine, Ser 2.38; Potassium 4.3; Sodium 137  Recent Lipid Panel    Component Value Date/Time   CHOL 101 10/06/2020 0435   TRIG 62 10/06/2020 0435   HDL 28 (L) 10/06/2020 0435   CHOLHDL 3.6 10/06/2020 0435   VLDL 12 10/06/2020 0435   LDLCALC 61 10/06/2020 0435     Risk Assessment/Calculations:      Physical Exam:    VS:  BP 120/60 (BP Location: Right Arm, Patient Position: Sitting, Cuff Size: Large)   Pulse 98   Ht 6\' 3"  (1.905 m)   Wt 243 lb (110.2 kg)   SpO2 93%   BMI 30.37 kg/m     Wt Readings from Last 3 Encounters:  11/02/20 243 lb (110.2 kg)  10/19/20 243 lb (110.2 kg)  10/14/20 240 lb 2 oz (108.9 kg)     GEN:  Well nourished, well developed in no acute distress HEENT: Normal NECK: No JVD; No carotid bruits LYMPHATICS: No lymphadenopathy CARDIAC: RRR, no murmurs, rubs, gallops RESPIRATORY:  Clear to auscultation without rales, wheezing or rhonchi  ABDOMEN: Soft, non-tender, distended MUSCULOSKELETAL:  1+ edema SKIN: Warm  and dry NEUROLOGIC:  Alert and oriented x 3 PSYCHIATRIC:  Normal affect   ASSESSMENT:    1. Heart failure with preserved ejection fraction, unspecified HF chronicity (HCC)   2. PAF (paroxysmal atrial fibrillation) (HCC)   3. PAD (peripheral artery disease) (HCC)   4. Primary hypertension    PLAN:    In order of problems listed above:  1. Patient with heart failure preserved EF, still with edema and abdominal distention.  Increase torsemide to 40 mg daily.  Check BMP in 10 to 14 days.  Follow-up after 2. Paroxysmal A. fib while hospitalized, brief episodes.  Anticoagulation not pursued  due to anemia.  Currently in sinus rhythm.  Continue Coreg. 3. PAD, left metatarsal amputation.  Continue aspirin, Plavix, Lipitor. 4. Hypertension, BP controlled.  Continue Coreg, lisinopril, hydralazine   Follow-up in 1 month    Medication Adjustments/Labs and Tests Ordered: Current medicines are reviewed at length with the patient today.  Concerns regarding medicines are outlined above.  Orders Placed This Encounter  Procedures  . Basic metabolic panel   Meds ordered this encounter  Medications  . torsemide (DEMADEX) 20 MG tablet    Sig: Take 2 tablets (40 mg total) by mouth daily.    Dispense:  60 tablet    Refill:  5    Patient Instructions  Medication Instructions:   Your physician has recommended you make the following change in your medication:   INCREASE your Torsemide to 40 MG: Take 2 tablets (20 MG each) once a day by mouth.  *If you need a refill on your cardiac medications before your next appointment, please call your pharmacy*   Lab Work:  Your physician recommends that you return for lab work in: 2 weeks - Please go to the [facility]. You will check in at the front desk to the right as you walk into the atrium. Valet Parking is offered if needed. - No appointment needed. You may go any day between 7 am and 6 pm.   Testing/Procedures: None  Ordered   Follow-Up: At [facility], you and your health needs are our priority.  As part of our continuing mission to provide you with exceptional heart care, we have created designated Provider Care Teams.  These Care Teams include your primary Cardiologist (physician) and Advanced Practice Providers (APPs -  Physician Assistants and Nurse Practitioners) who all work together to provide you with the care you need, when you need it.  We recommend signing up for the patient portal called "MyChart".  Sign up information is provided on this After Visit Summary.  MyChart is used to connect with patients for Virtual Visits (Telemedicine).  Patients are able to view lab/test results, encounter notes, upcoming appointments, etc.  Non-urgent messages can be sent to your provider as well.   To learn more about what you can do with MyChart, go to [email or web address].    Your next appointment:   1 month(s)  The format for your next appointment:   In Person  Provider:   Debbe Odea, MD   Other Instructions      Signed, Debbe Odea, MD  11/02/2020 5:15 PM    [facility]

## 2020-11-06 DIAGNOSIS — I10 Essential (primary) hypertension: Secondary | ICD-10-CM | POA: Diagnosis not present

## 2020-11-06 DIAGNOSIS — N1831 Chronic kidney disease, stage 3a: Secondary | ICD-10-CM | POA: Diagnosis not present

## 2020-12-02 ENCOUNTER — Other Ambulatory Visit
Admission: RE | Admit: 2020-12-02 | Discharge: 2020-12-02 | Disposition: A | Discharge status: Home / Self Care | Payer: Medicare HMO | Attending: Cardiology | Admitting: Cardiology

## 2020-12-02 DIAGNOSIS — I5030 Unspecified diastolic (congestive) heart failure: Secondary | ICD-10-CM | POA: Insufficient documentation

## 2020-12-02 LAB — BASIC METABOLIC PANEL
Anion gap: 12 (ref 5–15)
BUN: 78 mg/dL — ABNORMAL HIGH (ref 8–23)
CO2: 22 mmol/L (ref 22–32)
Calcium: 9.2 mg/dL (ref 8.9–10.3)
Chloride: 103 mmol/L (ref 98–111)
Creatinine, Ser: 2.22 mg/dL — ABNORMAL HIGH (ref 0.61–1.24)
GFR, Estimated: 31 mL/min — ABNORMAL LOW (ref >60)
Glucose, Bld: 188 mg/dL — ABNORMAL HIGH (ref 70–99)
Potassium: 4.5 mmol/L (ref 3.5–5.1)
Sodium: 137 mmol/L (ref 135–145)

## 2020-12-04 ENCOUNTER — Other Ambulatory Visit: Payer: Self-pay

## 2020-12-04 ENCOUNTER — Encounter: Payer: Self-pay | Admitting: Cardiology

## 2020-12-04 ENCOUNTER — Ambulatory Visit (INDEPENDENT_AMBULATORY_CARE_PROVIDER_SITE_OTHER): Payer: Medicare HMO | Admitting: Cardiology

## 2020-12-04 VITALS — BP 126/50 | HR 60 | Ht 75.0 in | Wt 233.0 lb

## 2020-12-04 DIAGNOSIS — I739 Peripheral vascular disease, unspecified: Secondary | ICD-10-CM | POA: Diagnosis not present

## 2020-12-04 DIAGNOSIS — I480 Paroxysmal atrial fibrillation: Secondary | ICD-10-CM | POA: Diagnosis not present

## 2020-12-04 DIAGNOSIS — I5030 Unspecified diastolic (congestive) heart failure: Secondary | ICD-10-CM | POA: Diagnosis not present

## 2020-12-04 DIAGNOSIS — I10 Essential (primary) hypertension: Secondary | ICD-10-CM

## 2020-12-04 MED ORDER — AMLODIPINE BESYLATE 5 MG PO TABS: 5.0000 mg | ORAL_TABLET | Freq: Every day | ORAL | 5 refills | Status: DC

## 2020-12-04 NOTE — Patient Instructions (Signed)
Medication Instructions:   Your physician has recommended you make the following change in your medication:   1.  DECREASE your Norvasc (Amlodipine) to 5 MG: Take 1 tab by mouth once a day.   *If you need a refill on your cardiac medications before your next appointment, please call your pharmacy*   Lab Work: None Ordered If you have labs (blood work) drawn today and your tests are completely normal, you will receive your results only by: Marland Kitchen MyChart Message (if you have MyChart) OR . A paper copy in the mail If you have any lab test that is abnormal or we need to change your treatment, we will call you to review the results.   Testing/Procedures: None ordered   Follow-Up: At [facility], you and your health needs are our priority.  As part of our continuing mission to provide you with exceptional heart care, we have created designated Provider Care Teams.  These Care Teams include your primary Cardiologist (physician) and Advanced Practice Providers (APPs -  Physician Assistants and Nurse Practitioners) who all work together to provide you with the care you need, when you need it.  We recommend signing up for the patient portal called "MyChart".  Sign up information is provided on this After Visit Summary.  MyChart is used to connect with patients for Virtual Visits (Telemedicine).  Patients are able to view lab/test results, encounter notes, upcoming appointments, etc.  Non-urgent messages can be sent to your provider as well.   To learn more about what you can do with MyChart, go to [email or web address].    Your next appointment:   6 month(s)  The format for your next appointment:   In Person  Provider:   Debbe Odea, MD   Other Instructions

## 2020-12-04 NOTE — Progress Notes (Signed)
Cardiology Office Note:    Date:  12/04/2020   ID:  Anthony Fox, DOB 1950/12/17, MRN [id number]  PCP:  Karrie Doffing, PA  [facility] Cardiologist:  No primary care provider on file.  [facility] Electrophysiologist:  None   Referring MD: Karrie Doffing, PA   Chief Complaint  Patient presents with  . Other    1 month follow up. Patient c.o swelling in legs and belly. Meds reviewed verbally with patient.     History of Present Illness:    Anthony Fox is a 70 y.o. male with a hx of HFpEF, PAD (left metatarsal amputation, right first and second toe amputation), diabetes, paroxysmal A. fib, CKD 3 who presents for follow-up.  Patient's torsemide was increased to 40 mg daily to improve diuresis.  He had a repeat BMP to evaluate creatinine 2 days ago.  He states his lower extremity edema has slowly improved since increasing dose of torsemide.  Has a follow-up appointment with his nephrologist upcoming.  Has no other concerns at this time.   Prior notes Patient had an admission on 10/06/2020 due to shortness of breath, diagnosed with volume overload and heart failure.  Paroxysmal atrial fibrillation noted while hospitalized.  VQ scan was negative for PE.  Managed with IV Lasix with improvement in symptoms.   Nuclear stress test 12/17 with no evidence for ischemia.   Echo 10/06/2020 showed preserved EF, 55 to 60%, mild LVH, mild LAE.   Due to high risk of bleeding, anemia, anticoagulation not recommended for A. fib upon discharge.  Aspirin, Plavix was continued. Weight at time of discharge was 236 pounds.   Past Medical History:  Diagnosis Date  . Anemia due to chronic kidney disease   . Arrhythmia    atrial fibrillation  . CHF (congestive heart failure) (HCC)   . CKD (chronic kidney disease), stage III (HCC)   . Hyperlipidemia   . Hyperlipidemia associated with type 2 diabetes mellitus (HCC)   . Hypertension associated with diabetes (HCC)   . Insulin dependent  type 2 diabetes mellitus (HCC)   . PAD (peripheral artery disease) (HCC)   . PVD (peripheral vascular disease) (HCC)     Past Surgical History:  Procedure Laterality Date  . PERIPHERAL ARTERIAL STENT GRAFT Bilateral   . 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    Current Medications: Current Meds  Medication Sig  . aspirin 81 MG chewable tablet Chew 81 mg by mouth daily.  Marland Kitchen atorvastatin (LIPITOR) 40 MG tablet Take 40 mg by mouth at bedtime.  . carvedilol (COREG) 25 MG tablet Take 25 mg by mouth 2 (two) times daily.  . clopidogrel (PLAVIX) 75 MG tablet Take 75 mg by mouth daily.  . ferrous sulfate 325 (65 FE) MG tablet Take 325 mg by mouth daily with breakfast.  . gabapentin (NEURONTIN) 300 MG capsule Take 300 mg by mouth at bedtime.  . hydrALAZINE (APRESOLINE) 50 MG tablet Take 50 mg by mouth 2 (two) times daily.  . insulin NPH Human (NOVOLIN N) 100 UNIT/ML injection Inject 28 units subcutaneously in the morning and 28 units in the evening  . lisinopril (ZESTRIL) 5 MG tablet Take 5 mg by mouth daily.  . sodium zirconium cyclosilicate (LOKELMA) 10 g PACK packet Take 10 g by mouth every other day.  . torsemide (DEMADEX) 20 MG tablet Take 2 tablets (40 mg total) by mouth daily.  . [DISCONTINUED] amLODipine (NORVASC) 10 MG tablet Take 10 mg by mouth daily.     Allergies:  Minocycline   Social History   Socioeconomic History  . Marital status: Married    Spouse name: Not on file  . Number of children: Not on file  . Years of education: Not on file  . Highest education level: Not on file  Occupational History  . Not on file  Tobacco Use  . Smoking status: Former Smoker    Years: 1.00  . Smokeless tobacco: Never Used  . Tobacco comment: 10/05/20: Quit many years ago  Vaping Use  . Vaping Use: Never used  Substance and Sexual Activity  . Alcohol use: Never  . Drug use: Never  . Sexual activity: Not on file  Other Topics Concern  . Not on file  Social History Narrative   . Not on file   Social Determinants of Health   Financial Resource Strain: Not on file  Food Insecurity: Not on file  Transportation Needs: Not on file  Physical Activity: Not on file  Stress: Not on file  Social Connections: Not on file     Family History: The patient's family history includes Diabetes in his brother and mother; Hypertension in his mother.  ROS:   Please see the history of present illness.     All other systems reviewed and are negative.  EKGs/Labs/Other Studies Reviewed:    The following studies were reviewed today:   EKG:  EKG is  ordered today.  The patient was, PACs, otherwise normal ECG.  Recent Labs: 10/05/2020: B Natriuretic Peptide 1,365.1 10/06/2020: ALT 62; Magnesium 2.1; TSH 0.451 10/09/2020: Hemoglobin 8.4; Platelets 233 12/02/2020: BUN 78; Creatinine, Ser 2.22; Potassium 4.5; Sodium 137  Recent Lipid Panel    Component Value Date/Time   CHOL 101 10/06/2020 0435   TRIG 62 10/06/2020 0435   HDL 28 (L) 10/06/2020 0435   CHOLHDL 3.6 10/06/2020 0435   VLDL 12 10/06/2020 0435   LDLCALC 61 10/06/2020 0435     Risk Assessment/Calculations:      Physical Exam:    VS:  BP (!) 126/50 (BP Location: Right Arm, Patient Position: Sitting, Cuff Size: Normal)   Pulse 60   Ht 6\' 3"  (1.905 m)   Wt 233 lb (105.7 kg)   SpO2 95%   BMI 29.12 kg/m     Wt Readings from Last 3 Encounters:  12/04/20 233 lb (105.7 kg)  11/02/20 243 lb (110.2 kg)  10/19/20 243 lb (110.2 kg)     GEN:  Well nourished, well developed in no acute distress HEENT: Normal NECK: No JVD; No carotid bruits LYMPHATICS: No lymphadenopathy CARDIAC: RRR, no murmurs, rubs, gallops RESPIRATORY:  Clear to auscultation without rales, wheezing or rhonchi  ABDOMEN: Soft, non-tender, distended MUSCULOSKELETAL:  trace edema SKIN: Warm and dry NEUROLOGIC:  Alert and oriented x 3 PSYCHIATRIC:  Normal affect   ASSESSMENT:    1. Heart failure with preserved ejection fraction,  unspecified HF chronicity (HCC)   2. PAF (paroxysmal atrial fibrillation) (HCC)   3. PAD (peripheral artery disease) (HCC)   4. Primary hypertension    PLAN:    In order of problems listed above:  1. Patient with heart failure preserved EF, edema is improved.  Creatinine stable.  Continue torsemide to 40 mg daily.   2. Paroxysmal A. fib while hospitalized, brief episodes.  No Anticoagulation  due to anemia.  Maintaining sinus rhythm.  Continue Coreg. 3. PAD, left metatarsal amputation.  Continue aspirin, Plavix, Lipitor. 4. Hypertension, BP controlled/low normal diastolic.  Continue Coreg, lisinopril, hydralazine.  Decrease Norvasc to 5  mg daily in case this is contributing to edema.  Follow-up in 6 months  Medication Adjustments/Labs and Tests Ordered: Current medicines are reviewed at length with the patient today.  Concerns regarding medicines are outlined above.  Orders Placed This Encounter  Procedures  . EKG 12-Lead   Meds ordered this encounter  Medications  . amLODipine (NORVASC) 5 MG tablet    Sig: Take 1 tablet (5 mg total) by mouth daily.    Dispense:  30 tablet    Refill:  5    Patient Instructions  Medication Instructions:   Your physician has recommended you make the following change in your medication:   1.  DECREASE your Norvasc (Amlodipine) to 5 MG: Take 1 tab by mouth once a day.   *If you need a refill on your cardiac medications before your next appointment, please call your pharmacy*   Lab Work: None Ordered If you have labs (blood work) drawn today and your tests are completely normal, you will receive your results only by: Marland Kitchen MyChart Message (if you have MyChart) OR . A paper copy in the mail If you have any lab test that is abnormal or we need to change your treatment, we will call you to review the results.   Testing/Procedures: None ordered   Follow-Up: At [facility], you and your health needs are our priority.  As part of our  continuing mission to provide you with exceptional heart care, we have created designated Provider Care Teams.  These Care Teams include your primary Cardiologist (physician) and Advanced Practice Providers (APPs -  Physician Assistants and Nurse Practitioners) who all work together to provide you with the care you need, when you need it.  We recommend signing up for the patient portal called "MyChart".  Sign up information is provided on this After Visit Summary.  MyChart is used to connect with patients for Virtual Visits (Telemedicine).  Patients are able to view lab/test results, encounter notes, upcoming appointments, etc.  Non-urgent messages can be sent to your provider as well.   To learn more about what you can do with MyChart, go to [email or web address].    Your next appointment:   6 month(s)  The format for your next appointment:   In Person  Provider:   Debbe Odea, MD   Other Instructions      Signed, Debbe Odea, MD  12/04/2020 5:12 PM    [facility]

## 2020-12-16 DIAGNOSIS — I10 Essential (primary) hypertension: Secondary | ICD-10-CM | POA: Diagnosis not present

## 2020-12-16 DIAGNOSIS — N1832 Chronic kidney disease, stage 3b: Secondary | ICD-10-CM | POA: Diagnosis not present

## 2020-12-16 DIAGNOSIS — D509 Iron deficiency anemia, unspecified: Secondary | ICD-10-CM | POA: Diagnosis not present

## 2020-12-16 DIAGNOSIS — E1165 Type 2 diabetes mellitus with hyperglycemia: Secondary | ICD-10-CM | POA: Diagnosis not present

## 2020-12-17 ENCOUNTER — Other Ambulatory Visit: Payer: Self-pay

## 2020-12-17 ENCOUNTER — Ambulatory Visit: Payer: Medicare HMO | Attending: Family | Admitting: Family

## 2020-12-17 VITALS — BP 111/62 | HR 103 | Resp 18 | Ht 75.0 in | Wt 231.2 lb

## 2020-12-17 DIAGNOSIS — E1151 Type 2 diabetes mellitus with diabetic peripheral angiopathy without gangrene: Secondary | ICD-10-CM | POA: Diagnosis not present

## 2020-12-17 DIAGNOSIS — I10 Essential (primary) hypertension: Secondary | ICD-10-CM

## 2020-12-17 DIAGNOSIS — E1122 Type 2 diabetes mellitus with diabetic chronic kidney disease: Secondary | ICD-10-CM | POA: Insufficient documentation

## 2020-12-17 DIAGNOSIS — Z87891 Personal history of nicotine dependence: Secondary | ICD-10-CM | POA: Insufficient documentation

## 2020-12-17 DIAGNOSIS — I4891 Unspecified atrial fibrillation: Secondary | ICD-10-CM | POA: Insufficient documentation

## 2020-12-17 DIAGNOSIS — IMO0002 Reserved for concepts with insufficient information to code with codable children: Secondary | ICD-10-CM

## 2020-12-17 DIAGNOSIS — I5032 Chronic diastolic (congestive) heart failure: Secondary | ICD-10-CM | POA: Insufficient documentation

## 2020-12-17 DIAGNOSIS — I480 Paroxysmal atrial fibrillation: Secondary | ICD-10-CM

## 2020-12-17 DIAGNOSIS — Z794 Long term (current) use of insulin: Secondary | ICD-10-CM | POA: Diagnosis not present

## 2020-12-17 DIAGNOSIS — Z7902 Long term (current) use of antithrombotics/antiplatelets: Secondary | ICD-10-CM | POA: Diagnosis not present

## 2020-12-17 DIAGNOSIS — E785 Hyperlipidemia, unspecified: Secondary | ICD-10-CM | POA: Diagnosis not present

## 2020-12-17 DIAGNOSIS — D631 Anemia in chronic kidney disease: Secondary | ICD-10-CM | POA: Diagnosis not present

## 2020-12-17 DIAGNOSIS — I130 Hypertensive heart and chronic kidney disease with heart failure and stage 1 through stage 4 chronic kidney disease, or unspecified chronic kidney disease: Secondary | ICD-10-CM | POA: Insufficient documentation

## 2020-12-17 DIAGNOSIS — N183 Chronic kidney disease, stage 3 unspecified: Secondary | ICD-10-CM | POA: Insufficient documentation

## 2020-12-17 DIAGNOSIS — Z79899 Other long term (current) drug therapy: Secondary | ICD-10-CM | POA: Insufficient documentation

## 2020-12-17 DIAGNOSIS — Z7982 Long term (current) use of aspirin: Secondary | ICD-10-CM | POA: Insufficient documentation

## 2020-12-17 DIAGNOSIS — E1165 Type 2 diabetes mellitus with hyperglycemia: Secondary | ICD-10-CM

## 2020-12-17 NOTE — Progress Notes (Signed)
Patient ID: Anthony Fox, male    DOB: 03/09/1951, 70 y.o.   MRN: [id number]  HPI  Anthony Fox is a 70 y/o male with a history of atrial fibrillation, DM, hyperlipidemia, HTN, CKD, PAD, anemia, PVD, previous tobacco use and chronic heart failure.   Echo report from 10/06/20 reviewed and showed an EF of 55-60% along with mild LVH, mildly elevated PA pressure, mild Anthony and mild/moderate TR.   Admitted 10/05/20 due to acute on chronic HF. Cardiology consult obtained. Initially given IV lasix with transition to oral diuretics. Needed bipap initially as well. Nuclear stress test negative for ischemia. Discharged after 4 days.   He presents today for a follow-up visit with a chief complaint of minimal shortness of breath upon moderate exertion. He describes this as chronic in nature having been present for several years. He has associated fatigue, cough, light-headedness, abdominal distention and pedal edema (improving) along with this. He denies any difficulty sleeping, palpitations, chest pain or weight gain.   Past Medical History:  Diagnosis Date  . Anemia due to chronic kidney disease   . Arrhythmia    atrial fibrillation  . CHF (congestive heart failure) (HCC)   . CKD (chronic kidney disease), stage III (HCC)   . Hyperlipidemia   . Hyperlipidemia associated with type 2 diabetes mellitus (HCC)   . Hypertension associated with diabetes (HCC)   . Insulin dependent type 2 diabetes mellitus (HCC)   . PAD (peripheral artery disease) (HCC)   . PVD (peripheral vascular disease) (HCC)    Past Surgical History:  Procedure Laterality Date  . PERIPHERAL ARTERIAL STENT GRAFT Bilateral   . 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   Family History  Problem Relation Age of Onset  . Diabetes Mother   . Hypertension Mother   . Diabetes Brother    Social History   Tobacco Use  . Smoking status: Former Smoker    Years: 1.00  . Smokeless tobacco: Never Used  . Tobacco comment: 10/05/20: Quit  many years ago  Substance Use Topics  . Alcohol use: Never   Allergies  Allergen Reactions  . Minocycline Rash   Prior to Admission medications   Medication Sig Start Date End Date Taking? Authorizing Provider  amLODipine (NORVASC) 5 MG tablet Take 1 tablet (5 mg total) by mouth daily. 12/04/20  Yes Debbe Odea, MD  aspirin 81 MG chewable tablet Chew 81 mg by mouth daily. 12/31/19  Yes [provider]  atorvastatin (LIPITOR) 40 MG tablet Take 40 mg by mouth at bedtime. 07/30/20  Yes [provider]  carvedilol (COREG) 25 MG tablet Take 25 mg by mouth 2 (two) times daily. 07/30/20  Yes [provider]  clopidogrel (PLAVIX) 75 MG tablet Take 75 mg by mouth daily. 07/30/20  Yes [provider]  ferrous sulfate 325 (65 FE) MG tablet Take 325 mg by mouth daily with breakfast.   Yes [provider]  gabapentin (NEURONTIN) 300 MG capsule Take 300 mg by mouth at bedtime. 09/22/20  Yes [provider]  hydrALAZINE (APRESOLINE) 50 MG tablet Take 50 mg by mouth 2 (two) times daily. 07/30/20  Yes [provider]  insulin NPH Human (NOVOLIN N) 100 UNIT/ML injection Inject 28 units subcutaneously in the morning and 28 units in the evening 09/12/18  Yes [provider]  lisinopril (ZESTRIL) 5 MG tablet Take 5 mg by mouth daily. 09/16/20  Yes [provider]  sodium zirconium cyclosilicate (LOKELMA) 10 g PACK packet Take 10 g by mouth  every other day.   Yes [provider]  torsemide (DEMADEX) 20 MG tablet Take 2 tablets (40 mg total) by mouth daily. 11/02/20  Yes Debbe Odea, MD    Review of Systems  Constitutional: Positive for fatigue. Negative for appetite change.  HENT: Negative for congestion, rhinorrhea and sore throat.   Eyes: Negative.   Respiratory: Positive for cough (sometimes) and shortness of breath (minimal). Negative for chest tightness.   Cardiovascular: Positive for leg swelling (improving).  Negative for chest pain and palpitations.  Gastrointestinal: Positive for abdominal distention. Negative for abdominal pain.  Endocrine: Negative.   Genitourinary: Negative.   Musculoskeletal: Negative for back pain and neck pain.  Skin: Negative.   Allergic/Immunologic: Negative.   Neurological: Positive for light-headedness (when changing positions too quickly). Negative for dizziness.  Hematological: Negative for adenopathy. Does not bruise/bleed easily.  Psychiatric/Behavioral: Negative for dysphoric mood and sleep disturbance (sleeping on 2 pillows). The patient is not nervous/anxious.    Vitals:   12/17/20 1354  BP: 111/62  Pulse: (!) 103  Resp: 18  SpO2: 98%  Weight: 231 lb 4 oz (104.9 kg)  Height: 6\' 3"  (1.905 m)   Wt Readings from Last 3 Encounters:  12/17/20 231 lb 4 oz (104.9 kg)  12/04/20 233 lb (105.7 kg)  11/02/20 243 lb (110.2 kg)   Lab Results  Component Value Date   CREATININE 2.22 (H) 12/02/2020   CREATININE 2.38 (H) 10/26/2020   CREATININE 1.79 (H) 10/09/2020     Physical Exam Vitals and nursing note reviewed.  Constitutional:      Appearance: Normal appearance.  HENT:     Head: Normocephalic and atraumatic.  Cardiovascular:     Rate and Rhythm: Tachycardia present. Rhythm irregular.  Pulmonary:     Effort: Pulmonary effort is normal. No respiratory distress.     Breath sounds: No wheezing or rales.  Abdominal:     General: There is no distension.     Palpations: Abdomen is soft.  Musculoskeletal:        General: No tenderness.     Cervical back: Normal range of motion and neck supple.     Right lower leg: No edema.     Left lower leg: No edema.  Skin:    General: Skin is warm and dry.  Neurological:     General: No focal deficit present.     Mental Status: He is alert and oriented to person, place, and time.  Psychiatric:        Mood and Affect: Mood normal.        Behavior: Behavior normal.        Thought Content: Thought content  normal.    Assessment & Plan:  1: Chronic heart failure with preserved ejection fraction along with structural changes (LVH)- - NYHA class II - euvolemic today - weighing daily; reminded to call for an overnight weight gain of > 2 pounds or a weekly weight gain of > 5 pounds - weight down 9 pounds from last visit here 2 months ago - not adding salt and has been trying to read labels for sodium content - saw cardiology ([place]) 12/04/20 - BNP 10/05/20 was 1365.1 - has received his flu vaccine for this season - has received both covid vaccines along with covid booster  2: HTN- - BP looks good today - had telemedicine visit with PCP 10/07/20) 03/05/20; instructed to schedule f/u appt with them or change to local PCP - BMP 12/02/20 reviewed and showed sodium 137,  potassium 4.5, creatinine 2.22 and GFR 31   3: DM- - saw nephrology ([place]) 12/16/20; returns in 3 months - A1c 10/06/20 was 8.1% - reports glucose at home today was 250  4: Atrial fibrillation- - irregular today - on clopidogrel and aspirion - tachycardic today   Patient did not bring his medications nor a list. Each medication was verbally reviewed with the patient and he was encouraged to bring the bottles to every visit to confirm accuracy of list.  Due to HF stability, will not make a return appointment at this time. Advised patient that he could call back at anytime to schedule another appointment and emphasized to continue follow-up with cardiology and PCP. Patient was comfortable with this plan.

## 2020-12-17 NOTE — Patient Instructions (Addendum)
Continue weighing daily and call for an overnight weight gain of > 2 pounds or a weekly weight gain of >5 pounds.   Call us in the future if you'd like to schedule another appointment 

## 2020-12-18 ENCOUNTER — Encounter: Payer: Self-pay | Admitting: Family

## 2021-01-24 ENCOUNTER — Emergency Department: Payer: Medicare HMO

## 2021-01-24 ENCOUNTER — Observation Stay
Admission: EM | Admit: 2021-01-24 | Discharge: 2021-01-26 | Disposition: A | Discharge status: Home / Self Care | Payer: Medicare HMO | Attending: Internal Medicine | Admitting: Internal Medicine

## 2021-01-24 ENCOUNTER — Encounter: Payer: Self-pay | Admitting: Emergency Medicine

## 2021-01-24 ENCOUNTER — Other Ambulatory Visit: Payer: Self-pay

## 2021-01-24 DIAGNOSIS — R059 Cough, unspecified: Secondary | ICD-10-CM | POA: Diagnosis not present

## 2021-01-24 DIAGNOSIS — K921 Melena: Principal | ICD-10-CM | POA: Insufficient documentation

## 2021-01-24 DIAGNOSIS — I739 Peripheral vascular disease, unspecified: Secondary | ICD-10-CM | POA: Diagnosis not present

## 2021-01-24 DIAGNOSIS — Z20822 Contact with and (suspected) exposure to covid-19: Secondary | ICD-10-CM | POA: Insufficient documentation

## 2021-01-24 DIAGNOSIS — Z79899 Other long term (current) drug therapy: Secondary | ICD-10-CM | POA: Insufficient documentation

## 2021-01-24 DIAGNOSIS — R001 Bradycardia, unspecified: Secondary | ICD-10-CM | POA: Diagnosis not present

## 2021-01-24 DIAGNOSIS — E1122 Type 2 diabetes mellitus with diabetic chronic kidney disease: Secondary | ICD-10-CM

## 2021-01-24 DIAGNOSIS — Z743 Need for continuous supervision: Secondary | ICD-10-CM | POA: Diagnosis not present

## 2021-01-24 DIAGNOSIS — D500 Iron deficiency anemia secondary to blood loss (chronic): Secondary | ICD-10-CM

## 2021-01-24 DIAGNOSIS — D649 Anemia, unspecified: Secondary | ICD-10-CM

## 2021-01-24 DIAGNOSIS — I1310 Hypertensive heart and chronic kidney disease without heart failure, with stage 1 through stage 4 chronic kidney disease, or unspecified chronic kidney disease: Secondary | ICD-10-CM | POA: Diagnosis not present

## 2021-01-24 DIAGNOSIS — R110 Nausea: Secondary | ICD-10-CM | POA: Diagnosis not present

## 2021-01-24 DIAGNOSIS — Z794 Long term (current) use of insulin: Secondary | ICD-10-CM | POA: Diagnosis not present

## 2021-01-24 DIAGNOSIS — R55 Syncope and collapse: Secondary | ICD-10-CM | POA: Diagnosis not present

## 2021-01-24 DIAGNOSIS — K3189 Other diseases of stomach and duodenum: Secondary | ICD-10-CM | POA: Diagnosis not present

## 2021-01-24 DIAGNOSIS — E538 Deficiency of other specified B group vitamins: Secondary | ICD-10-CM

## 2021-01-24 DIAGNOSIS — K222 Esophageal obstruction: Secondary | ICD-10-CM | POA: Diagnosis not present

## 2021-01-24 DIAGNOSIS — N1832 Chronic kidney disease, stage 3b: Secondary | ICD-10-CM | POA: Insufficient documentation

## 2021-01-24 DIAGNOSIS — I5032 Chronic diastolic (congestive) heart failure: Secondary | ICD-10-CM | POA: Diagnosis not present

## 2021-01-24 DIAGNOSIS — Z7982 Long term (current) use of aspirin: Secondary | ICD-10-CM | POA: Diagnosis not present

## 2021-01-24 DIAGNOSIS — E785 Hyperlipidemia, unspecified: Secondary | ICD-10-CM | POA: Insufficient documentation

## 2021-01-24 DIAGNOSIS — Z87891 Personal history of nicotine dependence: Secondary | ICD-10-CM | POA: Insufficient documentation

## 2021-01-24 DIAGNOSIS — D62 Acute posthemorrhagic anemia: Secondary | ICD-10-CM

## 2021-01-24 DIAGNOSIS — I517 Cardiomegaly: Secondary | ICD-10-CM | POA: Diagnosis not present

## 2021-01-24 DIAGNOSIS — R195 Other fecal abnormalities: Secondary | ICD-10-CM | POA: Diagnosis not present

## 2021-01-24 DIAGNOSIS — I959 Hypotension, unspecified: Secondary | ICD-10-CM

## 2021-01-24 DIAGNOSIS — Z7901 Long term (current) use of anticoagulants: Secondary | ICD-10-CM | POA: Insufficient documentation

## 2021-01-24 DIAGNOSIS — R531 Weakness: Secondary | ICD-10-CM | POA: Diagnosis not present

## 2021-01-24 DIAGNOSIS — N183 Chronic kidney disease, stage 3 unspecified: Secondary | ICD-10-CM

## 2021-01-24 LAB — URINALYSIS, COMPLETE (UACMP) WITH MICROSCOPIC
Bilirubin Urine: NEGATIVE
Glucose, UA: NEGATIVE mg/dL
Hgb urine dipstick: NEGATIVE
Ketones, ur: NEGATIVE mg/dL
Nitrite: NEGATIVE
Protein, ur: NEGATIVE mg/dL
Specific Gravity, Urine: 1.009 (ref 1.005–1.030)
pH: 5 (ref 5.0–8.0)

## 2021-01-24 LAB — CBC WITH DIFFERENTIAL/PLATELET
Abs Immature Granulocytes: 0.02 10*3/uL (ref 0.00–0.07)
Basophils Absolute: 0 10*3/uL (ref 0.0–0.1)
Basophils Relative: 1 %
Eosinophils Absolute: 0.4 10*3/uL (ref 0.0–0.5)
Eosinophils Relative: 7 %
HCT: 20.8 % — ABNORMAL LOW (ref 39.0–52.0)
Hemoglobin: 5.1 g/dL — ABNORMAL LOW (ref 13.0–17.0)
Immature Granulocytes: 0 %
Lymphocytes Relative: 13 %
Lymphs Abs: 0.8 10*3/uL (ref 0.7–4.0)
MCH: 17.1 pg — ABNORMAL LOW (ref 26.0–34.0)
MCHC: 24.5 g/dL — ABNORMAL LOW (ref 30.0–36.0)
MCV: 69.6 fL — ABNORMAL LOW (ref 80.0–100.0)
Monocytes Absolute: 0.8 10*3/uL (ref 0.1–1.0)
Monocytes Relative: 13 %
Neutro Abs: 4.2 10*3/uL (ref 1.7–7.7)
Neutrophils Relative %: 66 %
Platelets: 225 10*3/uL (ref 150–400)
RBC: 2.99 MIL/uL — ABNORMAL LOW (ref 4.22–5.81)
RDW: 19.9 % — ABNORMAL HIGH (ref 11.5–15.5)
Smear Review: NORMAL
WBC: 6.3 10*3/uL (ref 4.0–10.5)
nRBC: 2.5 % — ABNORMAL HIGH (ref 0.0–0.2)

## 2021-01-24 LAB — TROPONIN I (HIGH SENSITIVITY)
Troponin I (High Sensitivity): 34 ng/L — ABNORMAL HIGH (ref <18)
Troponin I (High Sensitivity): 31 ng/L — ABNORMAL HIGH (ref <18)

## 2021-01-24 LAB — COMPREHENSIVE METABOLIC PANEL
ALT: 11 U/L (ref 0–44)
AST: 19 U/L (ref 15–41)
Albumin: 3.9 g/dL (ref 3.5–5.0)
Alkaline Phosphatase: 75 U/L (ref 38–126)
Anion gap: 9 (ref 5–15)
BUN: 87 mg/dL — ABNORMAL HIGH (ref 8–23)
CO2: 20 mmol/L — ABNORMAL LOW (ref 22–32)
Calcium: 8.9 mg/dL (ref 8.9–10.3)
Chloride: 108 mmol/L (ref 98–111)
Creatinine, Ser: 2.59 mg/dL — ABNORMAL HIGH (ref 0.61–1.24)
GFR, Estimated: 26 mL/min — ABNORMAL LOW (ref >60)
Glucose, Bld: 144 mg/dL — ABNORMAL HIGH (ref 70–99)
Potassium: 4.9 mmol/L (ref 3.5–5.1)
Sodium: 137 mmol/L (ref 135–145)
Total Bilirubin: 1.1 mg/dL (ref 0.3–1.2)
Total Protein: 7 g/dL (ref 6.5–8.1)

## 2021-01-24 LAB — GLUCOSE, CAPILLARY: Glucose-Capillary: 86 mg/dL (ref 70–99)

## 2021-01-24 LAB — BRAIN NATRIURETIC PEPTIDE: B Natriuretic Peptide: 764.5 pg/mL — ABNORMAL HIGH (ref 0.0–100.0)

## 2021-01-24 LAB — PREPARE RBC (CROSSMATCH)

## 2021-01-24 LAB — ABO/RH: ABO/RH(D): B POS

## 2021-01-24 MED ORDER — ATORVASTATIN CALCIUM 20 MG PO TABS
40.0000 mg | ORAL_TABLET | Freq: Every day | ORAL | Status: DC
  Administered 2021-01-25: 40 mg via ORAL
  Filled 2021-01-24: qty 2

## 2021-01-24 MED ORDER — SODIUM CHLORIDE 0.9 % IV SOLN
80.0000 mg | Freq: Once | INTRAVENOUS | Status: AC
  Administered 2021-01-24: 80 mg via INTRAVENOUS
  Filled 2021-01-24: qty 80

## 2021-01-24 MED ORDER — SODIUM CHLORIDE 0.9 % IV SOLN: 10.0000 mL/h | Freq: Once | INTRAVENOUS | Status: DC

## 2021-01-24 MED ORDER — GABAPENTIN 300 MG PO CAPS
300.0000 mg | ORAL_CAPSULE | Freq: Every day | ORAL | Status: DC
  Administered 2021-01-24 – 2021-01-25 (×2): 300 mg via ORAL
  Filled 2021-01-24 (×2): qty 1

## 2021-01-24 MED ORDER — SODIUM CHLORIDE 0.9 % IV SOLN
8.0000 mg/h | INTRAVENOUS | Status: DC
  Administered 2021-01-24 – 2021-01-25 (×3): 8 mg/h via INTRAVENOUS
  Filled 2021-01-24 (×4): qty 80

## 2021-01-24 MED ORDER — INSULIN ASPART 100 UNIT/ML ~~LOC~~ SOLN
0.0000 [IU] | Freq: Three times a day (TID) | SUBCUTANEOUS | Status: DC
  Administered 2021-01-25: 18:00:00 3 [IU] via SUBCUTANEOUS
  Administered 2021-01-26: 2 [IU] via SUBCUTANEOUS
  Administered 2021-01-26: 5 [IU] via SUBCUTANEOUS
  Filled 2021-01-24 (×3): qty 1

## 2021-01-24 NOTE — Consult Note (Signed)
[facility] GI Inpatient Consult Note   Jamey Reas, M.D.  Reason for Consult: melena, heme positive stool, anemia   Attending Requesting Consult: Darlin Priestly, M.D.   History of Present Illness: Anthony Fox is a 70 y.o. male with a history of chronic iron deficiency anemia presenting to the emergency room for a syncopal episode while at church this morning.  Patient noted that he had vomited prior to passing out.  He denies of vomitus contained blood, stating that it was "just clear".  He has chronic "black stools" from oral iron supplementation, but has noticed the stools have become more frequent and "broken up" in consistency than usual. No abdominal pain, "just swelling". He takes a baby aspirin daily but denies any chronic NSAID use.  He takes Plavix on a daily basis due to peripheral vascular disease status post stenting and bypass. Patient reports a long history of iron deficiency there is been investigated at that [facility].  Patient  had a colonoscopy February 14, 2019 for positive FIT stool testing as well as iron deficiency anemia.  1 adenomatous polyp was noted and removed from the right colon.  No mention of colonic diverticulosis was made in the report.  Internal hemorrhoids were present.  Patient mentions and I was able to identify records from August 2014 when patient was in the hospital for obscure gastrointestinal bleeding.  EGD and colonoscopy performed on 06/13/2013 by Dr. Jorene Minors at [facility] revealed only mild gastric erythema as well as a 3 mm colon polyp.  No stigmata of bleeding or bleeding lesions were noted.  A follow-up capsule endoscopy of the small intestine on 06/16/2013 revealed no sources of anemia/GI bleeding throughout the exam.  Past Medical History:  Past Medical History:  Diagnosis Date  . Anemia due to chronic kidney disease   . Arrhythmia    atrial fibrillation  . CHF (congestive heart failure) (HCC)   . CKD (chronic kidney disease), stage III  (HCC)   . Hyperlipidemia   . Hyperlipidemia associated with type 2 diabetes mellitus (HCC)   . Hypertension associated with diabetes (HCC)   . Insulin dependent type 2 diabetes mellitus (HCC)   . PAD (peripheral artery disease) (HCC)   . PVD (peripheral vascular disease) (HCC)     Problem List: Patient Active Problem List   Diagnosis Date Noted  . Symptomatic anemia 01/24/2021  . Acute respiratory failure with hypoxia (HCC) 10/05/2020  . CKD (chronic kidney disease) stage 3, GFR 30-59 ml/min (HCC) 10/05/2020  . Hyperkalemia 10/05/2020  . Elevated troponin 10/05/2020  . Acute CHF (congestive heart failure) (HCC) 10/05/2020  . Hyperlipidemia associated with type 2 diabetes mellitus (HCC) 10/05/2020  . Acute renal failure superimposed on stage 3 chronic kidney disease (HCC) 10/05/2020  . Paroxysmal atrial fibrillation (HCC) 05/08/2014  . DM (diabetes mellitus) type II uncontrolled, periph vascular disorder (HCC) 08/14/2013  . Anemia of chronic kidney failure, stage 3 (moderate) (HCC) 06/11/2013  . PAD (peripheral artery disease) (HCC) 04/17/2013  . Hypertension associated with diabetes (HCC) 12/20/2010    Past Surgical History: Past Surgical History:  Procedure Laterality Date  . PERIPHERAL ARTERIAL STENT GRAFT Bilateral   . 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 Minocycline Rash    Home Medications: Medications Prior to Admission  Medication Sig Dispense Refill Last Dose  . amLODipine (NORVASC) 5 MG tablet Take 1 tablet (5 mg total) by mouth daily. 30 tablet 5 01/24/2021 at AM  . aspirin  81 MG chewable tablet Chew 81 mg by mouth daily.   01/23/2021 at AM  . atorvastatin (LIPITOR) 40 MG tablet Take 40 mg by mouth at bedtime.   01/24/2021 at AM  . carvedilol (COREG) 25 MG tablet Take 25 mg by mouth 2 (two) times daily.   01/23/2021 at AM  . clopidogrel (PLAVIX) 75 MG tablet Take 75 mg by mouth daily.   01/23/2021 at AM  . ferrous  sulfate 325 (65 FE) MG tablet Take 325 mg by mouth daily with breakfast.   01/24/2021 at AM  . gabapentin (NEURONTIN) 300 MG capsule Take 300 mg by mouth at bedtime.   01/23/2021 at PM  . hydrALAZINE (APRESOLINE) 50 MG tablet Take 50 mg by mouth 2 (two) times daily.   01/23/2021 at Unknown time  . insulin NPH Human (NOVOLIN N) 100 UNIT/ML injection Inject 28 units subcutaneously in the morning and 28 units in the evening   01/23/2021 at Unknown time  . lisinopril (ZESTRIL) 5 MG tablet Take 5 mg by mouth daily.   01/24/2021 at AM  . sodium zirconium cyclosilicate (LOKELMA) 10 g PACK packet Take 10 g by mouth every other day.   01/24/2021 at AM  . torsemide (DEMADEX) 20 MG tablet Take 2 tablets (40 mg total) by mouth daily. 60 tablet 5 01/24/2021 at AM   Home medication reconciliation was completed with the patient.   Scheduled Inpatient Medications:   . [START ON 01/25/2021] atorvastatin  40 mg Oral Daily  . gabapentin  300 mg Oral QHS  . [START ON 01/25/2021] insulin aspart  0-15 Units Subcutaneous TID WC    Continuous Inpatient Infusions:   . sodium chloride    . pantoprozole (PROTONIX) infusion 8 mg/hr (01/24/21 2048)    PRN Inpatient Medications:    Family History: family history includes Diabetes in his brother and mother; Hypertension in his mother.   GI Family History: Negative.  Social History:   reports that he has quit smoking. He quit after 1.00 year of use. He has never used smokeless tobacco. He reports that he does not drink alcohol and does not use drugs. The patient denies ETOH, tobacco, or drug use.    Review of Systems: Review of Systems - History obtained from the patient General ROS: negative for - fever, hot flashes, sleep disturbance or weight loss Psychological ROS: negative Ophthalmic ROS: negative Allergy and Immunology ROS: negative for - hives, latex, nasal congestion or seasonal allergies Hematological and Lymphatic ROS: negative for - bruising, jaundice, night sweats  or swollen lymph nodes Respiratory ROS: no cough, shortness of breath, or wheezing Cardiovascular ROS: no chest pain or dyspnea on exertion Genito-Urinary ROS: no dysuria, trouble voiding, or hematuria Musculoskeletal ROS: negative for - gait disturbance or muscle pain Neurological ROS: no TIA or stroke symptoms Dermatological ROS: negative  Physical Examination: BP (!) 127/53 (BP Location: Right Arm)   Pulse 60   Temp 98 F (36.7 C) (Oral)   Resp 19   Ht 6\' 3"  (1.905 m)   Wt 94.3 kg   SpO2 98%   BMI 26.00 kg/m  Physical Exam Vitals reviewed.  Constitutional:      General: He is not in acute distress.    Appearance: Normal appearance. He is not ill-appearing, toxic-appearing or diaphoretic.  HENT:     Head: Normocephalic and atraumatic.     Nose: Nose normal.     Mouth/Throat:     Mouth: Mucous membranes are moist.     Pharynx: Oropharynx  is clear.  Eyes:     General: No scleral icterus.    Conjunctiva/sclera: Conjunctivae normal.     Pupils: Pupils are equal, round, and reactive to light.  Cardiovascular:     Rate and Rhythm: Normal rate.     Pulses: Normal pulses.  Pulmonary:     Effort: Pulmonary effort is normal. No respiratory distress.     Breath sounds: Normal breath sounds.  Abdominal:     General: There is distension.     Palpations: Abdomen is soft. There is no mass.     Tenderness: There is no abdominal tenderness. There is no guarding.     Hernia: No hernia is present.  Musculoskeletal:        General: No swelling or deformity.     Cervical back: Normal range of motion.     Right lower leg: No edema.     Left lower leg: No edema.  Skin:    General: Skin is warm and dry.     Capillary Refill: Capillary refill takes less than 2 seconds.  Neurological:     General: No focal deficit present.     Mental Status: He is alert.  Psychiatric:        Mood and Affect: Mood normal.        Behavior: Behavior normal.        Thought Content: Thought content  normal.        Judgment: Judgment normal.     Data: Lab Results  Component Value Date   WBC 6.3 01/24/2021   HGB 5.1 (L) 01/24/2021   HCT 20.8 (L) 01/24/2021   MCV 69.6 (L) 01/24/2021   PLT 225 01/24/2021   Recent Labs  Lab 01/24/21 1451  HGB 5.1*   Lab Results  Component Value Date   NA 137 01/24/2021   K 4.9 01/24/2021   CL 108 01/24/2021   CO2 20 (L) 01/24/2021   BUN 87 (H) 01/24/2021   CREATININE 2.59 (H) 01/24/2021   Lab Results  Component Value Date   ALT 11 01/24/2021   AST 19 01/24/2021   ALKPHOS 75 01/24/2021   BILITOT 1.1 01/24/2021   No results for input(s): APTT, INR, PTT in the last 168 hours. CBC Latest Ref Rng & Units 01/24/2021 10/09/2020 10/08/2020  WBC 4.0 - 10.5 K/uL 6.3 5.8 8.1  Hemoglobin 13.0 - 17.0 g/dL 5.1(L) 8.4(L) 8.4(L)  Hematocrit 39.0 - 52.0 % 20.8(L) 28.1(L) 27.9(L)  Platelets 150 - 400 K/uL 225 233 228    STUDIES: DG Chest 2 View  Result Date: 01/24/2021 CLINICAL DATA:  Near syncopal episode. Nonproductive cough for 2-3 weeks. EXAM: CHEST - 2 VIEW COMPARISON:  Radiographs 10/07/2020 FINDINGS: Stable cardiomegaly. There is chronic vascular congestion and diffuse interstitial prominence, unchanged from the prior examination. No superimposed edema, airspace disease, pleural effusion or pneumothorax. The bones appear unremarkable. Telemetry leads overlie the chest. IMPRESSION: Stable cardiomegaly and chronic interstitial lung disease. No acute cardiopulmonary process. Electronically Signed   By: Carey Bullocks M.D.   On: 01/24/2021 16:03   @IMAGES @  Assessment:  1. Symptomatic anemia - likely secondary to acute on possibly chronic GI blood loss. 2. History of obscure gastrointestinal bleeding - Essentially negative workup (EGD, colonoscopy and wireless VCE) in August 2014 [facility]. 3. Personal history of tubular adenoma of the colon (2020 colonoscopy @ [facility]). 4. Chronic anticoagulant therapy - Plavix and ASA. On hold by Dr.  [facility], attending MD. 5. Hx of paroxysmal atrial fibrillation. 6. CHF,  unspecified type, with preserved EF. Followed by Dr. Azucena CecilAgbor-Etang, cardiologist. 7. Peripheral arterial disease.  COVID-19 status: Tested negative  Recommendations: 1. IV acid suppression. 2. Agree with holding anticoagulants as medically feasible. 3. Clear liquid diet. NPO after 0500 tomorrow. 4. IV PPI. 5. Serial H/H. 6. EGD in AM. The patient understands the nature of the planned procedure, indications, risks, alternatives and potential complications including but not limited to bleeding, infection, perforation, damage to internal organs and possible oversedation/side effects from anesthesia. The patient agrees and gives consent to proceed.  Please refer to procedure notes for findings, recommendations and patient disposition/instructions.   Thank you for the consult. Please call with questions or concerns.  Rosina Lowensteinoledo, Lory Galan, "Mellody DanceKeith" MD [facility] [address] [place], [place] [address] [phone]  01/24/2021 10:21 PM

## 2021-01-24 NOTE — H&P (Addendum)
History and Physical    Evens Meno ZWC:[id number] DOB: 12-Jan-1951 DOA: 01/24/2021  PCP: Karrie Doffing, PA  Patient coming from: home  I have personally briefly reviewed patient's old medical records in [facility]  Chief Complaint: near syncope  HPI: Anthony Fox is a 70 y.o. male with medical history significant of HFpEF, PAD s/p toe amputation and stents 12 years ago on ASA/plavix , diabetes, paroxysmal A. fib, CKD 3 who presented with near syncope.  Pt was at church earlier today when he felt faint and nauseated.  Denied chest pain, abdominal pain.  Had 1 episode of diarrhea last night which was black.  Had not noted frank blood in his stool.  Pt is currently on ASA, plavix and iron supplement at home.  Pt has been on plavix for the past 12 years for PAD which caused him to have toe amputations and stents put in.  Last colonoscopy was about a year ago which found only polyps.  Pt said his BP usually runs high but recently BP has been low.     Assessment/Plan Active Problems:   Symptomatic anemia  Symptomatic anemia likely due to GI bleed --Hgb 5.1 on presentation.  Was ~12 in Sept 2021, then ~8 in Dec 2021.   --HDS in the ED. --started on IV PPI gtt in the ED, and ordered 2u pRBC. Plan: --transfuse 2u pRBC --cont IV PPI gtt --GI consult --clear liquid diet  --Monitor Hgb and transfuse to keep Hgb >7  Near syncope --likely due to symptomatic anemia --orthostatic BP neg  Hx of PAD S/p left metatarsal amputation, right first and second toe amputation about 12 years ago and stents --Hold ASA and plavix due to likely GI bleed --cont home statin  Hx of HTN --BP has been soft recently.  Currently wnl in the ED. --hold all home BP meds  CKD 3b --Cr 2.59 on presentation, about baseline  DM2, poorly controlled --ACHS and SSI for now   DVT prophylaxis: SCD/Compression stockings Code Status: Full code  Family Communication:   Disposition Plan: home   Consults called: GI Level of care: Med-Surg   Review of Systems: As per HPI otherwise complete review of systems negative.   Past Medical History:  Diagnosis Date  . Anemia due to chronic kidney disease   . Arrhythmia    atrial fibrillation  . CHF (congestive heart failure) (HCC)   . CKD (chronic kidney disease), stage III (HCC)   . Hyperlipidemia   . Hyperlipidemia associated with type 2 diabetes mellitus (HCC)   . Hypertension associated with diabetes (HCC)   . Insulin dependent type 2 diabetes mellitus (HCC)   . PAD (peripheral artery disease) (HCC)   . PVD (peripheral vascular disease) (HCC)     Past Surgical History:  Procedure Laterality Date  . PERIPHERAL ARTERIAL STENT GRAFT Bilateral   . 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 He quit after 1.00 year of use. He has never used smokeless tobacco. He reports that he does not drink alcohol and does not use drugs.  Allergies  Allergen Reactions  . Minocycline Rash    Family History  Problem Relation Age of Onset  . Diabetes Mother   . Hypertension Mother   . Diabetes Brother      Prior to Admission medications   Medication Sig Start Date End Date Taking? Authorizing Provider  amLODipine (NORVASC) 5 MG tablet Take 1 tablet (5 mg total) by mouth daily. 12/04/20  Debbe Odea, MD  aspirin 81 MG chewable tablet Chew 81 mg by mouth daily. 12/31/19   [provider]  atorvastatin (LIPITOR) 40 MG tablet Take 40 mg by mouth at bedtime. 07/30/20   [provider]  carvedilol (COREG) 25 MG tablet Take 25 mg by mouth 2 (two) times daily. 07/30/20   [provider]  clopidogrel (PLAVIX) 75 MG tablet Take 75 mg by mouth daily. 07/30/20   [provider]  ferrous sulfate 325 (65 FE) MG tablet Take 325 mg by mouth daily with breakfast.    [provider]  gabapentin (NEURONTIN) 300 MG capsule Take 300 mg by mouth at bedtime. 09/22/20    [provider]  hydrALAZINE (APRESOLINE) 50 MG tablet Take 50 mg by mouth 2 (two) times daily. 07/30/20   [provider]  insulin NPH Human (NOVOLIN N) 100 UNIT/ML injection Inject 28 units subcutaneously in the morning and 28 units in the evening 09/12/18   [provider]  lisinopril (ZESTRIL) 5 MG tablet Take 5 mg by mouth daily. 09/16/20   [provider]  sodium zirconium cyclosilicate (LOKELMA) 10 g PACK packet Take 10 g by mouth every other day.    [provider]  torsemide (DEMADEX) 20 MG tablet Take 2 tablets (40 mg total) by mouth daily. 11/02/20   Debbe Odea, MD    Physical Exam: Vitals:   01/24/21 1430 01/24/21 1500 01/24/21 1613 01/24/21 1700  BP: 110/68 125/71 (!) 128/37 (!) 111/59  Pulse: (!) 103 99 64 65  Resp: 15 16 13 16   Temp:      TempSrc:      SpO2: 95% 97% 99% 96%  Weight:      Height:        Constitutional: NAD, AAOx3 HEENT: conjunctivae and lids normal, EOMI CV: No cyanosis.   RESP: normal respiratory effort, on RA Extremities: No effusions, edema in BLE SKIN: warm, dry Neuro: II - XII grossly intact.   Psych: Normal mood and affect.  Appropriate judgement and reason   Labs on Admission: I have personally reviewed following labs and imaging studies  CBC: Recent Labs  Lab 01/24/21 1451  WBC 6.3  NEUTROABS 4.2  HGB 5.1*  HCT 20.8*  MCV 69.6*  PLT 225   Basic Metabolic Panel: Recent Labs  Lab 01/24/21 1451  NA 137  K 4.9  CL 108  CO2 20*  GLUCOSE 144*  BUN 87*  CREATININE 2.59*  CALCIUM 8.9   GFR: Estimated Creatinine Clearance: 32.2 mL/min (A) (by C-G formula based on SCr of 2.59 mg/dL (H)). Liver Function Tests: Recent Labs  Lab 01/24/21 1451  AST 19  ALT 11  ALKPHOS 75  BILITOT 1.1  PROT 7.0  ALBUMIN 3.9   No results for input(s): LIPASE, AMYLASE in the last 168 hours. No results for input(s): AMMONIA in the last 168 hours. Coagulation Profile: No results for  input(s): INR, PROTIME in the last 168 hours. Cardiac Enzymes: No results for input(s): CKTOTAL, CKMB, CKMBINDEX, TROPONINI in the last 168 hours. BNP (last 3 results) No results for input(s): PROBNP in the last 8760 hours. HbA1C: No results for input(s): HGBA1C in the last 72 hours. CBG: No results for input(s): GLUCAP in the last 168 hours. Lipid Profile: No results for input(s): CHOL, HDL, LDLCALC, TRIG, CHOLHDL, LDLDIRECT in the last 72 hours. Thyroid Function Tests: No results for input(s): TSH, T4TOTAL, FREET4, T3FREE, THYROIDAB in the last 72 hours. Anemia Panel: No results for input(s): VITAMINB12, FOLATE,  FERRITIN, TIBC, IRON, RETICCTPCT in the last 72 hours. Urine analysis:    Component Value Date/Time   COLORURINE YELLOW (A) 01/24/2021 1426   APPEARANCEUR HAZY (A) 01/24/2021 1426   LABSPEC 1.009 01/24/2021 1426   PHURINE 5.0 01/24/2021 1426   GLUCOSEU NEGATIVE 01/24/2021 1426   HGBUR NEGATIVE 01/24/2021 1426   BILIRUBINUR NEGATIVE 01/24/2021 1426   KETONESUR NEGATIVE 01/24/2021 1426   PROTEINUR NEGATIVE 01/24/2021 1426   NITRITE NEGATIVE 01/24/2021 1426   LEUKOCYTESUR MODERATE (A) 01/24/2021 1426    Radiological Exams on Admission: DG Chest 2 View  Result Date: 01/24/2021 CLINICAL DATA:  Near syncopal episode. Nonproductive cough for 2-3 weeks. EXAM: CHEST - 2 VIEW COMPARISON:  Radiographs 10/07/2020 FINDINGS: Stable cardiomegaly. There is chronic vascular congestion and diffuse interstitial prominence, unchanged from the prior examination. No superimposed edema, airspace disease, pleural effusion or pneumothorax. The bones appear unremarkable. Telemetry leads overlie the chest. IMPRESSION: Stable cardiomegaly and chronic interstitial lung disease. No acute cardiopulmonary process. Electronically Signed   By: Carey Bullocks M.D.   On: 01/24/2021 16:03      Darlin Priestly MD [facility]  If 7PM-7AM, please contact night-coverage 01/24/2021, 5:58 PM

## 2021-01-24 NOTE — H&P (View-Only) (Signed)
[facility] GI Inpatient Consult Note   Jamey Reas, M.D.  Reason for Consult: melena, heme positive stool, anemia   Attending Requesting Consult: Darlin Priestly, M.D.   History of Present Illness: Emily Forse is a 70 y.o. male with a history of chronic iron deficiency anemia presenting to the emergency room for a syncopal episode while at church this morning.  Patient noted that he had vomited prior to passing out.  He denies of vomitus contained blood, stating that it was "just clear".  He has chronic "black stools" from oral iron supplementation, but has noticed the stools have become more frequent and "broken up" in consistency than usual. No abdominal pain, "just swelling". He takes a baby aspirin daily but denies any chronic NSAID use.  He takes Plavix on a daily basis due to peripheral vascular disease status post stenting and bypass. Patient reports a long history of iron deficiency there is been investigated at that [facility].  Patient  had a colonoscopy February 14, 2019 for positive FIT stool testing as well as iron deficiency anemia.  1 adenomatous polyp was noted and removed from the right colon.  No mention of colonic diverticulosis was made in the report.  Internal hemorrhoids were present.  Patient mentions and I was able to identify records from August 2014 when patient was in the hospital for obscure gastrointestinal bleeding.  EGD and colonoscopy performed on 06/13/2013 by Dr. Jorene Minors at [facility] revealed only mild gastric erythema as well as a 3 mm colon polyp.  No stigmata of bleeding or bleeding lesions were noted.  A follow-up capsule endoscopy of the small intestine on 06/16/2013 revealed no sources of anemia/GI bleeding throughout the exam.  Past Medical History:  Past Medical History:  Diagnosis Date  . Anemia due to chronic kidney disease   . Arrhythmia    atrial fibrillation  . CHF (congestive heart failure) (HCC)   . CKD (chronic kidney disease), stage III  (HCC)   . Hyperlipidemia   . Hyperlipidemia associated with type 2 diabetes mellitus (HCC)   . Hypertension associated with diabetes (HCC)   . Insulin dependent type 2 diabetes mellitus (HCC)   . PAD (peripheral artery disease) (HCC)   . PVD (peripheral vascular disease) (HCC)     Problem List: Patient Active Problem List   Diagnosis Date Noted  . Symptomatic anemia 01/24/2021  . Acute respiratory failure with hypoxia (HCC) 10/05/2020  . CKD (chronic kidney disease) stage 3, GFR 30-59 ml/min (HCC) 10/05/2020  . Hyperkalemia 10/05/2020  . Elevated troponin 10/05/2020  . Acute CHF (congestive heart failure) (HCC) 10/05/2020  . Hyperlipidemia associated with type 2 diabetes mellitus (HCC) 10/05/2020  . Acute renal failure superimposed on stage 3 chronic kidney disease (HCC) 10/05/2020  . Paroxysmal atrial fibrillation (HCC) 05/08/2014  . DM (diabetes mellitus) type II uncontrolled, periph vascular disorder (HCC) 08/14/2013  . Anemia of chronic kidney failure, stage 3 (moderate) (HCC) 06/11/2013  . PAD (peripheral artery disease) (HCC) 04/17/2013  . Hypertension associated with diabetes (HCC) 12/20/2010    Past Surgical History: Past Surgical History:  Procedure Laterality Date  . PERIPHERAL ARTERIAL STENT GRAFT Bilateral   . 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 Minocycline Rash    Home Medications: Medications Prior to Admission  Medication Sig Dispense Refill Last Dose  . amLODipine (NORVASC) 5 MG tablet Take 1 tablet (5 mg total) by mouth daily. 30 tablet 5 01/24/2021 at AM  . aspirin  81 MG chewable tablet Chew 81 mg by mouth daily.   01/23/2021 at AM  . atorvastatin (LIPITOR) 40 MG tablet Take 40 mg by mouth at bedtime.   01/24/2021 at AM  . carvedilol (COREG) 25 MG tablet Take 25 mg by mouth 2 (two) times daily.   01/23/2021 at AM  . clopidogrel (PLAVIX) 75 MG tablet Take 75 mg by mouth daily.   01/23/2021 at AM  . ferrous  sulfate 325 (65 FE) MG tablet Take 325 mg by mouth daily with breakfast.   01/24/2021 at AM  . gabapentin (NEURONTIN) 300 MG capsule Take 300 mg by mouth at bedtime.   01/23/2021 at PM  . hydrALAZINE (APRESOLINE) 50 MG tablet Take 50 mg by mouth 2 (two) times daily.   01/23/2021 at Unknown time  . insulin NPH Human (NOVOLIN N) 100 UNIT/ML injection Inject 28 units subcutaneously in the morning and 28 units in the evening   01/23/2021 at Unknown time  . lisinopril (ZESTRIL) 5 MG tablet Take 5 mg by mouth daily.   01/24/2021 at AM  . sodium zirconium cyclosilicate (LOKELMA) 10 g PACK packet Take 10 g by mouth every other day.   01/24/2021 at AM  . torsemide (DEMADEX) 20 MG tablet Take 2 tablets (40 mg total) by mouth daily. 60 tablet 5 01/24/2021 at AM   Home medication reconciliation was completed with the patient.   Scheduled Inpatient Medications:   . [START ON 01/25/2021] atorvastatin  40 mg Oral Daily  . gabapentin  300 mg Oral QHS  . [START ON 01/25/2021] insulin aspart  0-15 Units Subcutaneous TID WC    Continuous Inpatient Infusions:   . sodium chloride    . pantoprozole (PROTONIX) infusion 8 mg/hr (01/24/21 2048)    PRN Inpatient Medications:    Family History: family history includes Diabetes in his brother and mother; Hypertension in his mother.   GI Family History: Negative.  Social History:   reports that he has quit smoking. He quit after 1.00 year of use. He has never used smokeless tobacco. He reports that he does not drink alcohol and does not use drugs. The patient denies ETOH, tobacco, or drug use.    Review of Systems: Review of Systems - History obtained from the patient General ROS: negative for - fever, hot flashes, sleep disturbance or weight loss Psychological ROS: negative Ophthalmic ROS: negative Allergy and Immunology ROS: negative for - hives, latex, nasal congestion or seasonal allergies Hematological and Lymphatic ROS: negative for - bruising, jaundice, night sweats  or swollen lymph nodes Respiratory ROS: no cough, shortness of breath, or wheezing Cardiovascular ROS: no chest pain or dyspnea on exertion Genito-Urinary ROS: no dysuria, trouble voiding, or hematuria Musculoskeletal ROS: negative for - gait disturbance or muscle pain Neurological ROS: no TIA or stroke symptoms Dermatological ROS: negative  Physical Examination: BP (!) 127/53 (BP Location: Right Arm)   Pulse 60   Temp 98 F (36.7 C) (Oral)   Resp 19   Ht 6\' 3"  (1.905 m)   Wt 94.3 kg   SpO2 98%   BMI 26.00 kg/m  Physical Exam Vitals reviewed.  Constitutional:      General: He is not in acute distress.    Appearance: Normal appearance. He is not ill-appearing, toxic-appearing or diaphoretic.  HENT:     Head: Normocephalic and atraumatic.     Nose: Nose normal.     Mouth/Throat:     Mouth: Mucous membranes are moist.     Pharynx: Oropharynx  is clear.  Eyes:     General: No scleral icterus.    Conjunctiva/sclera: Conjunctivae normal.     Pupils: Pupils are equal, round, and reactive to light.  Cardiovascular:     Rate and Rhythm: Normal rate.     Pulses: Normal pulses.  Pulmonary:     Effort: Pulmonary effort is normal. No respiratory distress.     Breath sounds: Normal breath sounds.  Abdominal:     General: There is distension.     Palpations: Abdomen is soft. There is no mass.     Tenderness: There is no abdominal tenderness. There is no guarding.     Hernia: No hernia is present.  Musculoskeletal:        General: No swelling or deformity.     Cervical back: Normal range of motion.     Right lower leg: No edema.     Left lower leg: No edema.  Skin:    General: Skin is warm and dry.     Capillary Refill: Capillary refill takes less than 2 seconds.  Neurological:     General: No focal deficit present.     Mental Status: He is alert.  Psychiatric:        Mood and Affect: Mood normal.        Behavior: Behavior normal.        Thought Content: Thought content  normal.        Judgment: Judgment normal.     Data: Lab Results  Component Value Date   WBC 6.3 01/24/2021   HGB 5.1 (L) 01/24/2021   HCT 20.8 (L) 01/24/2021   MCV 69.6 (L) 01/24/2021   PLT 225 01/24/2021   Recent Labs  Lab 01/24/21 1451  HGB 5.1*   Lab Results  Component Value Date   NA 137 01/24/2021   K 4.9 01/24/2021   CL 108 01/24/2021   CO2 20 (L) 01/24/2021   BUN 87 (H) 01/24/2021   CREATININE 2.59 (H) 01/24/2021   Lab Results  Component Value Date   ALT 11 01/24/2021   AST 19 01/24/2021   ALKPHOS 75 01/24/2021   BILITOT 1.1 01/24/2021   No results for input(s): APTT, INR, PTT in the last 168 hours. CBC Latest Ref Rng & Units 01/24/2021 10/09/2020 10/08/2020  WBC 4.0 - 10.5 K/uL 6.3 5.8 8.1  Hemoglobin 13.0 - 17.0 g/dL 5.1(L) 8.4(L) 8.4(L)  Hematocrit 39.0 - 52.0 % 20.8(L) 28.1(L) 27.9(L)  Platelets 150 - 400 K/uL 225 233 228    STUDIES: DG Chest 2 View  Result Date: 01/24/2021 CLINICAL DATA:  Near syncopal episode. Nonproductive cough for 2-3 weeks. EXAM: CHEST - 2 VIEW COMPARISON:  Radiographs 10/07/2020 FINDINGS: Stable cardiomegaly. There is chronic vascular congestion and diffuse interstitial prominence, unchanged from the prior examination. No superimposed edema, airspace disease, pleural effusion or pneumothorax. The bones appear unremarkable. Telemetry leads overlie the chest. IMPRESSION: Stable cardiomegaly and chronic interstitial lung disease. No acute cardiopulmonary process. Electronically Signed   By: Carey Bullocks M.D.   On: 01/24/2021 16:03   @IMAGES @  Assessment:  1. Symptomatic anemia - likely secondary to acute on possibly chronic GI blood loss. 2. History of obscure gastrointestinal bleeding - Essentially negative workup (EGD, colonoscopy and wireless VCE) in August 2014 [facility]. 3. Personal history of tubular adenoma of the colon (2020 colonoscopy @ [facility]). 4. Chronic anticoagulant therapy - Plavix and ASA. On hold by Dr.  [facility], attending MD. 5. Hx of paroxysmal atrial fibrillation. 6. CHF,  unspecified type, with preserved EF. Followed by Dr. Agbor-Etang, cardiologist. 7. Peripheral arterial disease.  COVID-19 status: Tested negative  Recommendations: 1. IV acid suppression. 2. Agree with holding anticoagulants as medically feasible. 3. Clear liquid diet. NPO after 0500 tomorrow. 4. IV PPI. 5. Serial H/H. 6. EGD in AM. The patient understands the nature of the planned procedure, indications, risks, alternatives and potential complications including but not limited to bleeding, infection, perforation, damage to internal organs and possible oversedation/side effects from anesthesia. The patient agrees and gives consent to proceed.  Please refer to procedure notes for findings, recommendations and patient disposition/instructions.   Thank you for the consult. Please call with questions or concerns.  Michaelia Beilfuss, "Keith" MD [facility] [address] , [place] [address] [phone]  01/24/2021 10:21 PM     

## 2021-01-24 NOTE — ED Triage Notes (Signed)
Pt was in church where he had an episode of feeling faint, SOB, nauseated and feeling like he was going to pass out. Pt has been having low BP issues for the past week. States lowest SBP 99 and lowest DBP 38. Denies any recent illness.

## 2021-01-24 NOTE — ED Provider Notes (Signed)
-----------------------------------------   4:46 PM on 01/24/2021 -----------------------------------------  Patient's labs have resulted showing hemoglobin of 5.1 likely explain the patient's near syncopal episode.  Patient is feeling better here currently.  I personally seen and evaluated patient in conjunction with physician assistant Arleta Creek.  Patient has a benign abdominal exam.  Patient's rectal exam per Christiane Ha was guaiac positive.  Given the patient's low hemoglobin and guaiac positive stool we will start the patient on a Protonix bolus and infusion.  We will order 2 units of blood for the patient.  Patient will require admission to the hospital service for further work-up and likely GI consultation.  I personally verbally consented the patient for blood products.  Patient agreeable to plan of care.   Minna Antis, MD 01/24/21 [phone]

## 2021-01-24 NOTE — ED Provider Notes (Signed)
[facility] Emergency Department Provider Note  ____________________________________________  Time seen: Approximately 2:52 PM  I have reviewed the triage vital signs and the nursing notes.   HISTORY  Chief Complaint Near Syncope    HPI Anthony Fox is a 70 y.o. male who presents the emergency department via EMS for near syncope.  Patient states that over the last week he has had some issues with his blood pressure.  He states that his readings have been "low" for him.  Patient states that he has been running in the 90s to low 100 systolic with diastolic readings in the 30s or 40s.  Patient states that typically his blood pressure runs high.  He does take Norvasc, Coreg, hydralazine and torsemide.  He states that he was recently started on the torsemide as he had been fluid overloaded with his CHF.  He states that he has been on torsemide for roughly 2 to 3 weeks.  He has had over 20 pounds of fluid weight loss since starting the torsemide.  Patient with no chest pain, headaches, vision changes,        Past Medical History:  Diagnosis Date  . Anemia due to chronic kidney disease   . Arrhythmia    atrial fibrillation  . CHF (congestive heart failure) (HCC)   . CKD (chronic kidney disease), stage III (HCC)   . Hyperlipidemia   . Hyperlipidemia associated with type 2 diabetes mellitus (HCC)   . Hypertension associated with diabetes (HCC)   . Insulin dependent type 2 diabetes mellitus (HCC)   . PAD (peripheral artery disease) (HCC)   . PVD (peripheral vascular disease) [facility]     Patient Active Problem List   Diagnosis Date Noted  . Symptomatic anemia 01/24/2021  . Acute respiratory failure with hypoxia (HCC) 10/05/2020  . CKD (chronic kidney disease) stage 3, GFR 30-59 ml/min (HCC) 10/05/2020  . Hyperkalemia 10/05/2020  . Elevated troponin 10/05/2020  . Acute CHF (congestive heart failure) (HCC) 10/05/2020  . Hyperlipidemia associated with type 2  diabetes mellitus (HCC) 10/05/2020  . Acute renal failure superimposed on stage 3 chronic kidney disease (HCC) 10/05/2020  . Paroxysmal atrial fibrillation (HCC) 05/08/2014  . DM (diabetes mellitus) type II uncontrolled, periph vascular disorder (HCC) 08/14/2013  . Anemia of chronic kidney failure, stage 3 (moderate) (HCC) 06/11/2013  . PAD (peripheral artery disease) (HCC) 04/17/2013  . Hypertension associated with diabetes (HCC) 12/20/2010    Past Surgical History:  Procedure Laterality Date  . PERIPHERAL ARTERIAL STENT GRAFT Bilateral   . 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 Authorizing Provider  amLODipine (NORVASC) 5 MG tablet Take 1 tablet (5 mg total) by mouth daily. 12/04/20   Debbe Odea, MD  aspirin 81 MG chewable tablet Chew 81 mg by mouth daily. 12/31/19   [provider]  atorvastatin (LIPITOR) 40 MG tablet Take 40 mg by mouth at bedtime. 07/30/20   [provider]  carvedilol (COREG) 25 MG tablet Take 25 mg by mouth 2 (two) times daily. 07/30/20   [provider]  clopidogrel (PLAVIX) 75 MG tablet Take 75 mg by mouth daily. 07/30/20   [provider]  ferrous sulfate 325 (65 FE) MG tablet Take 325 mg by mouth daily with breakfast.    [provider]  gabapentin (NEURONTIN) 300 MG capsule Take 300 mg by mouth at bedtime. 09/22/20   [provider]  hydrALAZINE (APRESOLINE) 50 MG tablet  Take 50 mg by mouth 2 (two) times daily. 07/30/20   [provider]  insulin NPH Human (NOVOLIN N) 100 UNIT/ML injection Inject 28 units subcutaneously in the morning and 28 units in the evening 09/12/18   [provider]  lisinopril (ZESTRIL) 5 MG tablet Take 5 mg by mouth daily. 09/16/20   [provider]  sodium zirconium cyclosilicate (LOKELMA) 10 g PACK packet Take 10 g by mouth every other day.    [provider]   torsemide (DEMADEX) 20 MG tablet Take 2 tablets (40 mg total) by mouth daily. 11/02/20   Debbe OdeaAgbor-Etang, Brian, MD    Allergies Minocycline  Family History  Problem Relation Age of Onset  . Diabetes Mother   . Hypertension Mother   . Diabetes Brother     Social History Social History   Tobacco Use  . Smoking status: Former Smoker    Years: 1.00  . Smokeless tobacco: Never Used  . Tobacco comment: 10/05/20: Quit many years ago  Vaping Use  . Vaping Use: Never used  Substance Use Topics  . Alcohol use: Never  . Drug use: Never     Review of Systems  Constitutional: No fever/chills.  Difficulty with hypotension at home.  Near syncope today. Eyes: No visual changes. No discharge ENT: No upper respiratory complaints. Cardiovascular: no chest pain. Respiratory: no cough. No SOB. Gastrointestinal: No abdominal pain.  No nausea, no vomiting.  No diarrhea.  No constipation. Genitourinary: Negative for dysuria. No hematuria Musculoskeletal: Negative for musculoskeletal pain. Skin: Negative for rash, abrasions, lacerations, ecchymosis. Neurological: Negative for headaches, focal weakness or numbness.  10 System ROS otherwise negative.  ____________________________________________   PHYSICAL EXAM:  VITAL SIGNS: ED Triage Vitals  Enc Vitals Group     BP 01/24/21 1408 (!) 121/52     Pulse Rate 01/24/21 1408 62     Resp 01/24/21 1408 18     Temp 01/24/21 1408 (!) 97.3 F (36.3 C)     Temp Source 01/24/21 1408 Oral     SpO2 01/24/21 1408 96 %     Weight 01/24/21 1409 208 lb (94.3 kg)     Height 01/24/21 1409 6\' 3"  (1.905 m)     Head Circumference --      Peak Flow --      Pain Score 01/24/21 1409 0     Pain Loc --      Pain Edu? --      Excl. in GC? --      Constitutional: Alert and oriented. Well appearing and in no acute distress. Eyes: Conjunctivae are normal. PERRL. EOMI. Head: Atraumatic. ENT:      Ears:       Nose: No congestion/rhinnorhea.       Mouth/Throat: Mucous membranes are moist.  Neck: No stridor.  Hematological/Lymphatic/Immunilogical: No cervical lymphadenopathy. Cardiovascular: Normal rate, regular rhythm. Normal S1 and S2.  Good peripheral circulation. Respiratory: Normal respiratory effort without tachypnea or retractions. Lungs CTAB. Good air entry to the bases with no decreased or absent breath sounds. Gastrointestinal: Bowel sounds 4 quadrants. Soft and nontender to palpation. No guarding or rigidity. No palpable masses. No distention. No CVA tenderness. Musculoskeletal: Full range of motion to all extremities. No gross deformities appreciated. Neurologic:  Normal speech and language. No gross focal neurologic deficits are appreciated.  Skin:  Skin is warm, dry and intact. No rash noted. Psychiatric: Mood and affect are normal. Speech and behavior are normal. Patient exhibits appropriate insight and judgement.  ____________________________________________   LABS (all labs ordered are listed, but only abnormal results are displayed)  Labs Reviewed  COMPREHENSIVE METABOLIC PANEL - Abnormal; Notable for the following components:      Result Value   CO2 20 (*)    Glucose, Bld 144 (*)    BUN 87 (*)    Creatinine, Ser 2.59 (*)    GFR, Estimated 26 (*)    All other components within normal limits  CBC WITH DIFFERENTIAL/PLATELET - Abnormal; Notable for the following components:   RBC 2.99 (*)    Hemoglobin 5.1 (*)    HCT 20.8 (*)    MCV 69.6 (*)    MCH 17.1 (*)    MCHC 24.5 (*)    RDW 19.9 (*)    nRBC 2.5 (*)    All other components within normal limits  URINALYSIS, COMPLETE (UACMP) WITH MICROSCOPIC - Abnormal; Notable for the following components:   Color, Urine YELLOW (*)    APPearance HAZY (*)    Leukocytes,Ua MODERATE (*)    Bacteria, UA RARE (*)    All other components within normal limits  BRAIN NATRIURETIC PEPTIDE - Abnormal; Notable for the following components:   B Natriuretic Peptide 764.5 (*)     All other components within normal limits  TROPONIN I (HIGH SENSITIVITY) - Abnormal; Notable for the following components:   Troponin I (High Sensitivity) 34 (*)    All other components within normal limits  SARS CORONAVIRUS 2 (TAT 6-24 HRS)  PREPARE RBC (CROSSMATCH)  ABO/RH  TYPE AND SCREEN  TROPONIN I (HIGH SENSITIVITY)   ____________________________________________  EKG   ____________________________________________  RADIOLOGY I personally viewed and evaluated these images as part of my medical decision making, as well as reviewing the written report by the radiologist.  ED Provider Interpretation: No acute cardiopulmonary abnormality on chest x-ray.  DG Chest 2 View  Result Date: 01/24/2021 CLINICAL DATA:  Near syncopal episode. Nonproductive cough for 2-3 weeks. EXAM: CHEST - 2 VIEW COMPARISON:  Radiographs 10/07/2020 FINDINGS: Stable cardiomegaly. There is chronic vascular congestion and diffuse interstitial prominence, unchanged from the prior examination. No superimposed edema, airspace disease, pleural effusion or pneumothorax. The bones appear unremarkable. Telemetry leads overlie the chest. IMPRESSION: Stable cardiomegaly and chronic interstitial lung disease. No acute cardiopulmonary process. Electronically Signed   By: Carey Bullocks M.D.   On: 01/24/2021 16:03    ____________________________________________    PROCEDURES  Procedure(s) performed:    Procedures    Medications  0.9 %  sodium chloride infusion (has no administration in time range)  pantoprazole (PROTONIX) 80 mg in sodium chloride 0.9 % 100 mL (0.8 mg/mL) infusion (has no administration in time range)  pantoprazole (PROTONIX) 80 mg in sodium chloride 0.9 % 100 mL IVPB (has no administration in time range)     ____________________________________________   INITIAL IMPRESSION / ASSESSMENT AND PLAN / ED COURSE  Pertinent labs & imaging results that were available during my care of the  patient were reviewed by me and considered in my medical decision making (see chart for details).  Review of the [place] CSRS was performed in accordance of the [facility] prior to dispensing any controlled drugs.  Clinical Course as of 01/24/21 1730  Sun Jan 24, 2021  1636 Patient was found to be anemic with a hemoglobin of 5.1.  Last hemoglobin was 8.43 months ago.  Patient states that he does have issues with anemia, takes iron supplementation daily.  He states that he has been worked up at [facility]  after requiring transfusions and they were unable to find a cause according to the patient.  Patient denies any recent trauma, surgeries, active blood loss.  He denied a bloody stools.  Patient was guaiac positive however.  No frank blood identified on rectal exam.  Patient has no GI complaints of abdominal pain.  No tenderness on exam. [JC]    Clinical Course User Index [JC] Makilah Dowda, Delorise Royals, PA-C          Patient's diagnosis is consistent with anemia, weakness, near syncope.  Patient presented to emergency department complaining of near syncopal episode at church today.  Patient states that his blood pressures been running low for him.  He is on multiple antihypertensives and had been recently started on torsemide for CHF.  He states that he is lost over 20 pounds of fluid weight in the past 3 weeks.  Patient was unsure whether this may have contributed to his low blood pressure plus near syncope.  Overall exam was reassuring.  Patient has been relatively stable with his blood pressures with systolics in the 110s and 120s.  His diastolic has been intermittently low with diastolics into the low 30s.  Patient had received a small fluid bolus with EMS after they had found him hypotensive.  As patient had been maintaining his systolic pressure, additional fluids was not ordered as patient does have again this history of CHF.  Patient's BNP is 764, troponin was up at 34 which is consistent with his known CHF.   Patient's labs returned with creatinine 2.59 which is around patient's baseline, and a hemoglobin of 5.1.  This is a drop from his previous labs in December or slightly over 3 months ago.  Patient denied any blood loss source with recent trauma, surgeries or GI loss.  However patient is guaiac positive.  There was no frank blood with rectal exam but again he was positive on testing.  This time patient will have transfusion ordered as well as Protonix.  I have discussed the case with the hospitalist and they will admit to the hospitalist service..      ____________________________________________  FINAL CLINICAL IMPRESSION(S) / ED DIAGNOSES  Final diagnoses:  Iron deficiency anemia due to chronic blood loss  Melena      NEW MEDICATIONS STARTED DURING THIS VISIT:  ED Discharge Orders    None          This chart was dictated using voice recognition software/Dragon. Despite best efforts to proofread, errors can occur which can change the meaning. Any change was purely unintentional.    Racheal Patches, PA-C 01/24/21 1730    Minna Antis, MD 01/24/21 [phone]

## 2021-01-25 ENCOUNTER — Encounter
Admission: EM | Disposition: A | Discharge status: Home / Self Care | Payer: Self-pay | Source: Home / Self Care | Attending: Emergency Medicine

## 2021-01-25 ENCOUNTER — Encounter: Payer: Self-pay | Admitting: Hospitalist

## 2021-01-25 ENCOUNTER — Observation Stay: Payer: Medicare HMO | Admitting: Anesthesiology

## 2021-01-25 DIAGNOSIS — R195 Other fecal abnormalities: Secondary | ICD-10-CM | POA: Diagnosis not present

## 2021-01-25 DIAGNOSIS — D649 Anemia, unspecified: Secondary | ICD-10-CM | POA: Diagnosis not present

## 2021-01-25 DIAGNOSIS — D500 Iron deficiency anemia secondary to blood loss (chronic): Secondary | ICD-10-CM | POA: Diagnosis not present

## 2021-01-25 DIAGNOSIS — N183 Chronic kidney disease, stage 3 unspecified: Secondary | ICD-10-CM | POA: Diagnosis not present

## 2021-01-25 DIAGNOSIS — D62 Acute posthemorrhagic anemia: Secondary | ICD-10-CM | POA: Diagnosis not present

## 2021-01-25 DIAGNOSIS — E1122 Type 2 diabetes mellitus with diabetic chronic kidney disease: Secondary | ICD-10-CM | POA: Diagnosis not present

## 2021-01-25 DIAGNOSIS — E538 Deficiency of other specified B group vitamins: Secondary | ICD-10-CM | POA: Diagnosis not present

## 2021-01-25 DIAGNOSIS — I5032 Chronic diastolic (congestive) heart failure: Secondary | ICD-10-CM

## 2021-01-25 DIAGNOSIS — I130 Hypertensive heart and chronic kidney disease with heart failure and stage 1 through stage 4 chronic kidney disease, or unspecified chronic kidney disease: Secondary | ICD-10-CM | POA: Diagnosis not present

## 2021-01-25 DIAGNOSIS — D631 Anemia in chronic kidney disease: Secondary | ICD-10-CM | POA: Diagnosis not present

## 2021-01-25 DIAGNOSIS — Q2733 Arteriovenous malformation of digestive system vessel: Secondary | ICD-10-CM | POA: Diagnosis not present

## 2021-01-25 DIAGNOSIS — K921 Melena: Secondary | ICD-10-CM | POA: Diagnosis not present

## 2021-01-25 DIAGNOSIS — N1831 Chronic kidney disease, stage 3a: Secondary | ICD-10-CM | POA: Diagnosis not present

## 2021-01-25 DIAGNOSIS — K222 Esophageal obstruction: Secondary | ICD-10-CM | POA: Diagnosis not present

## 2021-01-25 DIAGNOSIS — I739 Peripheral vascular disease, unspecified: Secondary | ICD-10-CM | POA: Diagnosis not present

## 2021-01-25 DIAGNOSIS — E785 Hyperlipidemia, unspecified: Secondary | ICD-10-CM | POA: Diagnosis not present

## 2021-01-25 DIAGNOSIS — K31811 Angiodysplasia of stomach and duodenum with bleeding: Secondary | ICD-10-CM | POA: Diagnosis not present

## 2021-01-25 DIAGNOSIS — I959 Hypotension, unspecified: Secondary | ICD-10-CM

## 2021-01-25 DIAGNOSIS — I509 Heart failure, unspecified: Secondary | ICD-10-CM | POA: Diagnosis not present

## 2021-01-25 HISTORY — PX: ESOPHAGOGASTRODUODENOSCOPY (EGD) WITH PROPOFOL: SHX5813

## 2021-01-25 LAB — CBC
HCT: 26.2 % — ABNORMAL LOW (ref 39.0–52.0)
Hemoglobin: 7.1 g/dL — ABNORMAL LOW (ref 13.0–17.0)
MCH: 19.4 pg — ABNORMAL LOW (ref 26.0–34.0)
MCHC: 27.1 g/dL — ABNORMAL LOW (ref 30.0–36.0)
MCV: 71.6 fL — ABNORMAL LOW (ref 80.0–100.0)
Platelets: 228 10*3/uL (ref 150–400)
RBC: 3.66 MIL/uL — ABNORMAL LOW (ref 4.22–5.81)
RDW: 22.8 % — ABNORMAL HIGH (ref 11.5–15.5)
WBC: 6.4 10*3/uL (ref 4.0–10.5)
nRBC: 2.2 % — ABNORMAL HIGH (ref 0.0–0.2)

## 2021-01-25 LAB — BASIC METABOLIC PANEL
Anion gap: 7 (ref 5–15)
BUN: 76 mg/dL — ABNORMAL HIGH (ref 8–23)
CO2: 22 mmol/L (ref 22–32)
Calcium: 9.2 mg/dL (ref 8.9–10.3)
Chloride: 112 mmol/L — ABNORMAL HIGH (ref 98–111)
Creatinine, Ser: 2.27 mg/dL — ABNORMAL HIGH (ref 0.61–1.24)
GFR, Estimated: 30 mL/min — ABNORMAL LOW (ref >60)
Glucose, Bld: 94 mg/dL (ref 70–99)
Potassium: 4.5 mmol/L (ref 3.5–5.1)
Sodium: 141 mmol/L (ref 135–145)

## 2021-01-25 LAB — GLUCOSE, CAPILLARY
Glucose-Capillary: 93 mg/dL (ref 70–99)
Glucose-Capillary: 97 mg/dL (ref 70–99)
Glucose-Capillary: 177 mg/dL — ABNORMAL HIGH (ref 70–99)
Glucose-Capillary: 197 mg/dL — ABNORMAL HIGH (ref 70–99)

## 2021-01-25 LAB — PREPARE RBC (CROSSMATCH)

## 2021-01-25 LAB — MRSA PCR SCREENING: MRSA by PCR: POSITIVE — AB

## 2021-01-25 LAB — SARS CORONAVIRUS 2 (TAT 6-24 HRS): SARS Coronavirus 2: NEGATIVE

## 2021-01-25 LAB — FERRITIN: Ferritin: 4 ng/mL — ABNORMAL LOW (ref 24–336)

## 2021-01-25 SURGERY — ESOPHAGOGASTRODUODENOSCOPY (EGD) WITH PROPOFOL
Anesthesia: General

## 2021-01-25 MED ORDER — CYANOCOBALAMIN 1000 MCG/ML IJ SOLN
1000.0000 ug | Freq: Every day | INTRAMUSCULAR | Status: DC
  Administered 2021-01-25 – 2021-01-26 (×2): 1000 ug via INTRAMUSCULAR
  Filled 2021-01-25 (×2): qty 1

## 2021-01-25 MED ORDER — ACETAMINOPHEN 325 MG PO TABS: 650.0000 mg | ORAL_TABLET | Freq: Once | ORAL | Status: DC

## 2021-01-25 MED ORDER — PROPOFOL 500 MG/50ML IV EMUL
INTRAVENOUS | Status: DC | PRN
  Administered 2021-01-25: 120 ug/kg/min via INTRAVENOUS

## 2021-01-25 MED ORDER — SODIUM CHLORIDE 0.9 % IV SOLN: INTRAVENOUS | Status: DC

## 2021-01-25 MED ORDER — SODIUM CHLORIDE 0.9 % IV SOLN
400.0000 mg | Freq: Once | INTRAVENOUS | Status: AC
  Administered 2021-01-26: 05:00:00 400 mg via INTRAVENOUS
  Filled 2021-01-25: qty 20

## 2021-01-25 MED ORDER — MUPIROCIN 2 % EX OINT
1.0000 "application " | TOPICAL_OINTMENT | Freq: Two times a day (BID) | CUTANEOUS | Status: DC
  Administered 2021-01-25 – 2021-01-26 (×4): 1 via NASAL
  Filled 2021-01-25 (×2): qty 22

## 2021-01-25 MED ORDER — FUROSEMIDE 10 MG/ML IJ SOLN
40.0000 mg | Freq: Once | INTRAMUSCULAR | Status: AC
  Administered 2021-01-25: 16:00:00 40 mg via INTRAVENOUS
  Filled 2021-01-25: qty 4

## 2021-01-25 MED ORDER — SODIUM CHLORIDE 0.9% IV SOLUTION: Freq: Once | INTRAVENOUS | Status: AC

## 2021-01-25 MED ORDER — CHLORHEXIDINE GLUCONATE CLOTH 2 % EX PADS
6.0000 | MEDICATED_PAD | Freq: Every day | CUTANEOUS | Status: DC
  Administered 2021-01-25: 6 via TOPICAL

## 2021-01-25 MED ORDER — PROPOFOL 10 MG/ML IV BOLUS
INTRAVENOUS | Status: DC | PRN
  Administered 2021-01-25: 70 mg via INTRAVENOUS

## 2021-01-25 NOTE — Anesthesia Postprocedure Evaluation (Signed)
Anesthesia Post Note  Patient: Jahkai Yandell  Procedure(s) Performed: ESOPHAGOGASTRODUODENOSCOPY (EGD) WITH PROPOFOL (N/A )  Patient location during evaluation: PACU Anesthesia Type: General Level of consciousness: awake and alert Pain management: pain level controlled Vital Signs Assessment: post-procedure vital signs reviewed and stable Respiratory status: spontaneous breathing, nonlabored ventilation, respiratory function stable and patient connected to nasal cannula oxygen Cardiovascular status: blood pressure returned to baseline and stable Postop Assessment: no apparent nausea or vomiting Anesthetic complications: no   No complications documented.   Last Vitals:  Vitals:   01/25/21 1514 01/25/21 1529  BP: 123/71 138/65  Pulse: 85 92  Resp: 18 18  Temp: 36.7 C 37.1 C  SpO2: 99% 94%    Last Pain:  Vitals:   01/25/21 1529  TempSrc: Oral  PainSc:                  Cleda Mccreedy Shabrea Weldin

## 2021-01-25 NOTE — Progress Notes (Signed)
Patient ID: Anthony Fox, male   DOB: June 08, 1951, 70 y.o.   MRN: [id number] [facility] PROGRESS NOTE  Anthony Fox Anthony Fox DOB: Oct 06, 1951 DOA: 01/24/2021 PCP: Laural Benes A, PA  HPI/Subjective: Patient seen this morning.  Had Fox little bit of shortness of breath.  No cough.  Has not had Fox bowel movement since yesterday.  Patient stated he had black stools prior to coming into the hospital.  He also takes oral iron  Objective: Vitals:   01/25/21 1359 01/25/21 1419  BP: (!) 103/50   Pulse: 75   Resp: 18   Temp: (!) 97.2 F (36.2 C)   SpO2: 95% 99%    Intake/Output Summary (Last 24 hours) at 01/25/2021 1507 Last data filed at 01/25/2021 1347 Gross per 24 hour  Intake 1061.93 ml  Output 2050 ml  Net -988.07 ml   Filed Weights   01/24/21 1409  Weight: 94.3 kg    ROS: Review of Systems  Respiratory: Negative for cough and shortness of breath.   Cardiovascular: Negative for chest pain.  Gastrointestinal: Positive for melena. Negative for abdominal pain, nausea and vomiting.   Exam: Physical Exam HENT:     Head: Normocephalic.     Mouth/Throat:     Pharynx: No oropharyngeal exudate.  Eyes:     General: Lids are normal.     Pupils: Pupils are equal, round, and reactive to light.     Comments: Conjunctiva pale  Cardiovascular:     Rate and Rhythm: Normal rate and regular rhythm.     Heart sounds: Normal heart sounds, S1 normal and S2 normal.  Pulmonary:     Breath sounds: Normal breath sounds. No decreased breath sounds, wheezing, rhonchi or rales.  Abdominal:     Palpations: Abdomen is soft.     Tenderness: There is no abdominal tenderness.  Musculoskeletal:     Right lower leg: No swelling.     Left lower leg: No swelling.  Skin:    General: Skin is warm.     Findings: No rash.  Neurological:     Mental Status: He is alert and oriented to person, place, and time.       Data Reviewed: Basic Metabolic Panel: Recent Labs  Lab 01/24/21 1451  01/25/21 0730  NA 137 141  K 4.9 4.5  CL 108 112*  CO2 20* 22  GLUCOSE 144* 94  BUN 87* 76*  CREATININE 2.59* 2.27*  CALCIUM 8.9 9.2   Liver Function Tests: Recent Labs  Lab 01/24/21 1451  AST 19  ALT 11  ALKPHOS 75  BILITOT 1.1  PROT 7.0  ALBUMIN 3.9   CBC: Recent Labs  Lab 01/24/21 1451 01/25/21 0730  WBC 6.3 6.4  NEUTROABS 4.2  --   HGB 5.1* 7.1*  HCT 20.8* 26.2*  MCV 69.6* 71.6*  PLT 225 228   BNP (last 3 results) Recent Labs    10/05/20 1928 01/24/21 1452  BNP 1,365.1* 764.5*    CBG: Recent Labs  Lab 01/24/21 2140 01/25/21 0742 01/25/21 1202  GLUCAP 86 93 97    Recent Results (from the past 240 hour(s))  SARS CORONAVIRUS 2 (TAT 6-24 HRS) Nasopharyngeal Nasopharyngeal Swab     Status: None   Collection Time: 01/24/21  5:23 PM   Specimen: Nasopharyngeal Swab  Result Value Ref Range Status   SARS Coronavirus 2 NEGATIVE NEGATIVE Final    Comment: (NOTE) SARS-CoV-2 target nucleic acids are NOT DETECTED.  The SARS-CoV-2 RNA is generally detectable in upper and lower  respiratory specimens during the acute phase of infection. Negative results do not preclude SARS-CoV-2 infection, do not rule out co-infections with other pathogens, and should not be used as the sole basis for treatment or other patient management decisions. Negative results must be combined with clinical observations, patient history, and epidemiological information. The expected result is Negative.  Fact Sheet for Patients: HairSlick.no  Fact Sheet for Healthcare Providers: quierodirigir.com  This test is not yet approved or cleared by the [place] FDA and  has been authorized for detection and/or diagnosis of SARS-CoV-2 by FDA under an Emergency Use Authorization (EUA). This EUA will remain  in effect (meaning this test can be used) for the duration of the COVID-19 declaration under Se ction 564(b)(1) of the Act, 21  U.S.C. section 360bbb-3(b)(1), unless the authorization is terminated or revoked sooner.  Performed at [facility], [address], [place], [place] [address]   MRSA PCR Screening     Status: Abnormal   Collection Time: 01/24/21 11:52 PM   Specimen: Nasal Mucosa; Nasopharyngeal  Result Value Ref Range Status   MRSA by PCR POSITIVE (Fox) NEGATIVE Final    Comment:        The GeneXpert MRSA Assay (FDA approved for NASAL specimens only), is one component of Fox comprehensive MRSA colonization surveillance program. It is not intended to diagnose MRSA infection nor to guide or monitor treatment for MRSA infections. RESULT CALLED TO, READ BACK BY AND VERIFIED WITH: Anthony Cooley RN [id number] 01/25/2021 JG Performed at [facility], [address], [place], [place] [address]      Studies: DG Chest 2 View  Result Date: 01/24/2021 CLINICAL DATA:  Near syncopal episode. Nonproductive cough for 2-3 weeks. EXAM: CHEST - 2 VIEW COMPARISON:  Radiographs 10/07/2020 FINDINGS: Stable cardiomegaly. There is chronic vascular congestion and diffuse interstitial prominence, unchanged from the prior examination. No superimposed edema, airspace disease, pleural effusion or pneumothorax. The bones appear unremarkable. Telemetry leads overlie the chest. IMPRESSION: Stable cardiomegaly and chronic interstitial lung disease. No acute cardiopulmonary process. Electronically Signed   By: Carey Bullocks M.D.   On: 01/24/2021 16:03    Scheduled Meds: . sodium chloride   Intravenous Once  . acetaminophen  650 mg Oral Once  . atorvastatin  40 mg Oral Daily  . Chlorhexidine Gluconate Cloth  6 each Topical Q0600  . cyanocobalamin  1,000 mcg Intramuscular Daily  . furosemide  40 mg Intravenous Once  . gabapentin  300 mg Oral QHS  . insulin aspart  0-15 Units Subcutaneous TID WC  . mupirocin ointment  1 application Nasal BID   Continuous Infusions: . sodium chloride    . [START ON 01/26/2021] iron  sucrose    . pantoprozole (PROTONIX) infusion 8 mg/hr (01/25/21 1045)    Assessment/Plan:  1. Acute on chronic blood loss anemia.  EGD today showed an angiectasia in the duodenum that was cauterized.  Advance diet to soft diet.  Hemoglobin came up from 5.1 to 7.1.  Since still having some shortness of breath I will give 1 more unit of blood today.  Ferritin very low at 4.  We will give IV iron tomorrow morning. 2. Vitamin B12 deficiency.  On 10/06/2020 vitamin B12 is 126.  Will give IM B12 while here and convert over to oral upon discharge. 3. Relative hypotension will give 1 unit of blood. 4. Peripheral vascular disease holding aspirin and Plavix right now 5. 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  Sliding scale insulin for now. 6. Chronic diastolic congestive heart failure.  We will give Fox dose of Lasix prior to this unit of blood.  Blood pressure too low for cardiac meds currently     Code Status:     Code Status Orders  (From admission, onward)         Start     Ordered   01/24/21 2039  Full code  Continuous        01/24/21 2038        Code Status History    Date Active Date Inactive Code Status Order ID Comments User Context   10/05/2020 2225 10/09/2020 2103 Full Code [id number]  Charlsie Quest, MD ED   Advance Care Planning Activity     Family Communication: Spoke with patient's wife on the phone Disposition Plan: Status is: Observation  Dispo: The patient is from: Home              Anticipated d/c is to: Home likely tomorrow on 01/26/2021              Patient currently being transfused 1/3 unit of packed red blood cells and will also give IV iron tomorrow morning.   Difficult to place patient.  No.  Consultants:  Gastroenterology  Procedures:  EGD  Time spent: 28 minutes  Kiora Hallberg [employer]

## 2021-01-25 NOTE — Care Management Obs Status (Signed)
MEDICARE OBSERVATION STATUS NOTIFICATION   Patient Details  Name: Anthony Fox MRN: [id number] Date of Birth: [birth year]   Medicare Observation Status Notification Given:  Yes    Allayne Butcher, RN 01/25/2021, 11:26 AM

## 2021-01-25 NOTE — TOC Initial Note (Signed)
Transition of Care [facility]) - Initial/Assessment Note    Patient Details  Name: Anthony Fox MRN: [id number] Date of Birth: May 09, 1951  Transition of Care [facility]) CM/SW Contact:    Shelbie Hutching, RN Phone Number: 01/25/2021, 12:27 PM  Clinical Narrative:                 Patient placed under observation for symptomatic anemia.  Patient did require blood transfusion.  Going for EGD today.   RNCM met with patient at the bedside.  MOON reviewed.  Patient is from home with wife and independent.  No needs identified at this time.   Expected Discharge Plan: Home/Self Care Barriers to Discharge: Continued Medical Work up   Patient Goals and CMS Choice        Expected Discharge Plan and Services Expected Discharge Plan: Home/Self Care   Discharge Planning Services: CM Consult   Living arrangements for the past 2 months: Single Family Home                 DME Arranged: N/A DME Agency: NA       HH Arranged: NA          Prior Living Arrangements/Services Living arrangements for the past 2 months: Single Family Home Lives with:: Spouse Patient language and need for interpreter reviewed:: Yes Do you feel safe going back to the place where you live?: Yes      Need for Family Participation in Patient Care: Yes (Comment) (anemia) Care giver support system in place?: Yes (comment) (wife) Current home services: DME (cane) Criminal Activity/Legal Involvement Pertinent to Current Situation/Hospitalization: No - Comment as needed  Activities of Daily Living Home Assistive Devices/Equipment: Cane (specify quad or straight) ADL Screening (condition at time of admission) Patient's cognitive ability adequate to safely complete daily activities?: Yes Is the patient deaf or have difficulty hearing?: No Does the patient have difficulty seeing, even when wearing glasses/contacts?: No Does the patient have difficulty concentrating, remembering, or making decisions?: No Patient able to express  need for assistance with ADLs?: Yes Does the patient have difficulty dressing or bathing?: No Independently performs ADLs?: Yes (appropriate for developmental age) Does the patient have difficulty walking or climbing stairs?: Yes Weakness of Legs: Both Weakness of Arms/Hands: None  Permission Sought/Granted Permission sought to share information with : Case Manager,Family Supports Permission granted to share information with : Yes, Verbal Permission Granted  Share Information with NAME: Romie Minus     Permission granted to share info w Relationship: wife     Emotional Assessment Appearance:: Appears stated age Attitude/Demeanor/Rapport: Engaged Affect (typically observed): Accepting Orientation: : Oriented to Self,Oriented to Place,Oriented to  Time,Oriented to Situation Alcohol / Substance Use: Not Applicable Psych Involvement: No (comment)  Admission diagnosis:  Melena [K92.1] Iron deficiency anemia due to chronic blood loss [D50.0] Symptomatic anemia [D64.9] Patient Active Problem List   Diagnosis Date Noted  . Symptomatic anemia 01/24/2021  . Acute respiratory failure with hypoxia ([place]) 10/05/2020  . CKD (chronic kidney disease) stage 3, GFR 30-59 ml/min (HCC) 10/05/2020  . Hyperkalemia 10/05/2020  . Elevated troponin 10/05/2020  . Acute CHF (congestive heart failure) ([place]) 10/05/2020  . Hyperlipidemia associated with type 2 diabetes mellitus ([place]) 10/05/2020  . Acute renal failure superimposed on stage 3 chronic kidney disease ([place]) 10/05/2020  . Paroxysmal atrial fibrillation ([place]) 05/08/2014  . DM (diabetes mellitus) type II uncontrolled, periph vascular disorder ([place]) 08/14/2013  . Anemia of chronic kidney failure, stage 3 (moderate) ([place]) 06/11/2013  . PAD (  peripheral artery disease) ([place]) 04/17/2013  . Hypertension associated with diabetes ([place]) 12/20/2010   PCP:  Samara Snide, PA Pharmacy:   [facility] [address] [place] [address] [place]) [place] [address] Phone: [phone] Fax: [phone]     Social Determinants of Health (SDOH) Interventions    Readmission Risk Interventions No flowsheet data found.

## 2021-01-25 NOTE — Transfer of Care (Signed)
Immediate Anesthesia Transfer of Care Note  Patient: Anthony Fox  Procedure(s) Performed: ESOPHAGOGASTRODUODENOSCOPY (EGD) WITH PROPOFOL (N/A )  Patient Location: Endoscopy Unit  Anesthesia Type:General  Level of Consciousness: awake and alert   Airway & Oxygen Therapy: Patient Spontanous Breathing and Patient connected to nasal cannula oxygen  Post-op Assessment: Report given to RN and Post -op Vital signs reviewed and stable  Post vital signs: Reviewed  Last Vitals:  Vitals Value Taken Time  BP 103/50 01/25/21 1400  Temp 36.2 C 01/25/21 1359  Pulse 76 01/25/21 1402  Resp 26 01/25/21 1402  SpO2 92 % 01/25/21 1402  Vitals shown include unvalidated device data.  Last Pain:  Vitals:   01/25/21 1359  TempSrc: Temporal  PainSc: Asleep         Complications: No complications documented.

## 2021-01-25 NOTE — Op Note (Signed)
[facility] Gastroenterology Patient Name: Anthony Fox Procedure Date: 01/25/2021 1:38 PM MRN: [id number] Account #: [id number] Date of Birth: June 04, 1951 Admit Type: Inpatient Age: 70 Room: [facility] ENDO ROOM 2 Gender: Male Note Status: Finalized Procedure:             Upper GI endoscopy Indications:           Acute post hemorrhagic anemia, Melena Providers:             Boykin Nearing. Norma Fredrickson MD, MD Referring MD:          Karrie Doffing (Referring MD) Medicines:             Propofol per Anesthesia Complications:         No immediate complications. Procedure:             Pre-Anesthesia Assessment:                        - The risks and benefits of the procedure and the                         sedation options and risks were discussed with the                         patient. All questions were answered and informed                         consent was obtained.                        - Patient identification and proposed procedure were                         verified prior to the procedure by the nurse. The                         procedure was verified in the procedure room.                        - ASA Grade Assessment: III - A patient with severe                         systemic disease.                        After obtaining informed consent, the endoscope was                         passed under direct vision. Throughout the procedure,                         the patient's blood pressure, pulse, and oxygen                         saturations were monitored continuously. The Endoscope                         was introduced through the mouth, and advanced to the  third part of duodenum. The upper GI endoscopy was                         accomplished without difficulty. The patient tolerated                         the procedure well. Findings:      Two benign-appearing, intrinsic mild stenoses were found in the distal       esophagus. The  narrowest stenosis measured 1.6 cm (inner diameter) x       less than one cm (in length). The stenoses were traversed.      The entire examined stomach was normal.      A single 6 mm angioectasia with bleeding on contact was found in the       duodenal bulb. Coagulation for hemostasis using argon plasma at 0.8       liters/minute and 20 watts was successful.      The second portion of the duodenum and third portion of the duodenum       were normal.      The exam was otherwise without abnormality. Impression:            - Benign-appearing esophageal stenoses.                        - Normal stomach.                        - A single angioectasia in the duodenum. Treated with                         argon plasma coagulation (APC).                        - Normal second portion of the duodenum and third                         portion of the duodenum.                        - The examination was otherwise normal.                        - No specimens collected. Recommendation:        - Patient has a contact number available for                         emergencies. The signs and symptoms of potential                         delayed complications were discussed with the patient.                         Return to normal activities tomorrow. Written                         discharge instructions were provided to the patient.                        - Resume previous diet.                        -  Continue present medications.                        - Serial H/H, Serial exams                        - GI will sign off for now. Call back if we can help. Procedure Code(s):     --- Professional ---                        [phone], Esophagogastroduodenoscopy, flexible,                         transoral; with control of bleeding, any method Diagnosis Code(s):     --- Professional ---                        K92.1, Melena (includes Hematochezia)                        D62, Acute posthemorrhagic anemia                         K31.819, Angiodysplasia of stomach and duodenum                         without bleeding                        K22.2, Esophageal obstruction CPT copyright 2019 American Medical Association. All rights reserved. The codes documented in this report are preliminary and upon coder review may  be revised to meet current compliance requirements. Stanton Kidney MD, MD 01/25/2021 1:59:29 PM This report has been signed electronically. Number of Addenda: 0 Note Initiated On: 01/25/2021 1:38 PM Estimated Blood Loss:  Estimated blood loss was minimal.      [facility]

## 2021-01-25 NOTE — Anesthesia Preprocedure Evaluation (Addendum)
Anesthesia Evaluation  Patient identified by MRN, date of birth, ID band Patient awake  General Assessment Comment:Hgb 5.1 yesterday afternoon  Reviewed: Allergy & Precautions, NPO status , Patient's Chart, lab work & pertinent test results  History of Anesthesia Complications Negative for: history of anesthetic complications  Airway Mallampati: III       Dental  (+) Upper Dentures   Pulmonary neg sleep apnea, neg COPD, Not current smoker, former smoker,           Cardiovascular hypertension, Pt. on medications +CHF  (-) Past MI (-) dysrhythmias (-) Valvular Problems/Murmurs  1. Left ventricular ejection fraction, by estimation, is 55 to 60%. The  left ventricle has normal function. The left ventricle has no regional  wall motion abnormalities. There is mild left ventricular hypertrophy.  Left ventricular diastolic parameters  are indeterminate.  2. Right ventricular systolic function is moderately reduced. The right  ventricular size is mildly enlarged. There is mildly elevated pulmonary  artery systolic pressure.  3. Left atrial size was mildly dilated.  4. Right atrial size was mildly dilated.  5. A small pericardial effusion is present.  6. The mitral valve is normal in structure. Mild mitral valve  regurgitation. No evidence of mitral stenosis.  7. Tricuspid valve regurgitation is mild to moderate.  8. The aortic valve is tricuspid. Aortic valve regurgitation is not  visualized. No aortic stenosis is present.  9. The inferior vena cava is dilated in size with <50% respiratory  variability, suggesting right atrial pressure of 15 mmHg.    Neuro/Psych neg Seizures    GI/Hepatic Neg liver ROS, neg GERD  ,  Endo/Other  diabetes, Type 2, Oral Hypoglycemic Agents  Renal/GU CRF and Renal InsufficiencyRenal disease     Musculoskeletal   Abdominal   Peds  Hematology  (+) Blood dyscrasia, anemia ,    Anesthesia Other Findings Past Medical History: No date: Anemia due to chronic kidney disease No date: Arrhythmia     Comment:  atrial fibrillation No date: CHF (congestive heart failure) (HCC) No date: CKD (chronic kidney disease), stage III (HCC) No date: Hyperlipidemia No date: Hyperlipidemia associated with type 2 diabetes mellitus (HCC) No date: Hypertension associated with diabetes (HCC) No date: Insulin dependent type 2 diabetes mellitus (HCC) No date: PAD (peripheral artery disease) (HCC) No date: PVD (peripheral vascular disease) (HCC)   Reproductive/Obstetrics                           Anesthesia Physical Anesthesia Plan  ASA: III  Anesthesia Plan: General   Post-op Pain Management:    Induction: Intravenous  PONV Risk Score and Plan: 2 and Propofol infusion and TIVA  Airway Management Planned:   Additional Equipment:   Intra-op Plan:   Post-operative Plan:   Informed Consent: I have reviewed the patients History and Physical, chart, labs and discussed the procedure including the risks, benefits and alternatives for the proposed anesthesia with the patient or authorized representative who has indicated his/her understanding and acceptance.       Plan Discussed with:   Anesthesia Plan Comments:         Anesthesia Quick Evaluation

## 2021-01-25 NOTE — Plan of Care (Signed)
Shift Summary: Pt admitted from ED overnight, in no acute distress. VSS, oriented x4, on RA. Received x2 units PRBCs overnight w/o issue, pt hopeful that EGD today will be revealing. Protonix gtt infusing per MAR. Adequate UO, no BM this shift. NPO since 0500, pt aware of plan of care. Rounding performed, fall/safety precautions in place, needs/concerns addressed during shift.   Problem: Education: Goal: Knowledge of General Education information will improve Description: Including pain rating scale, medication(s)/side effects and non-pharmacologic comfort measures 01/25/2021 0555 by Michail Sermon, RN Outcome: Progressing 01/24/2021 2353 by Michail Sermon, RN Outcome: Progressing   Problem: Health Behavior/Discharge Planning: Goal: Ability to manage health-related needs will improve 01/25/2021 0555 by Michail Sermon, RN Outcome: Progressing 01/24/2021 2353 by Michail Sermon, RN Outcome: Progressing   Problem: Clinical Measurements: Goal: Ability to maintain clinical measurements within normal limits will improve 01/25/2021 0555 by Michail Sermon, RN Outcome: Progressing 01/24/2021 2353 by Michail Sermon, RN Outcome: Progressing Goal: Will remain free from infection 01/25/2021 0555 by Michail Sermon, RN Outcome: Progressing 01/24/2021 2353 by Michail Sermon, RN Outcome: Progressing Goal: Diagnostic test results will improve 01/25/2021 0555 by Michail Sermon, RN Outcome: Progressing 01/24/2021 2353 by Michail Sermon, RN Outcome: Progressing Goal: Respiratory complications will improve 01/25/2021 0555 by Michail Sermon, RN Outcome: Progressing 01/24/2021 2353 by Michail Sermon, RN Outcome: Progressing Goal: Cardiovascular complication will be avoided 01/25/2021 0555 by Michail Sermon, RN Outcome: Progressing 01/24/2021 2353 by Michail Sermon, RN Outcome: Progressing   Problem: Activity: Goal: Risk for activity intolerance will decrease 01/25/2021 0555 by Michail Sermon, RN Outcome:  Progressing 01/24/2021 2353 by Michail Sermon, RN Outcome: Progressing   Problem: Nutrition: Goal: Adequate nutrition will be maintained 01/25/2021 0555 by Michail Sermon, RN Outcome: Progressing 01/24/2021 2353 by Michail Sermon, RN Outcome: Progressing   Problem: Coping: Goal: Level of anxiety will decrease 01/25/2021 0555 by Michail Sermon, RN Outcome: Progressing 01/24/2021 2353 by Michail Sermon, RN Outcome: Progressing   Problem: Elimination: Goal: Will not experience complications related to bowel motility 01/25/2021 0555 by Michail Sermon, RN Outcome: Progressing 01/24/2021 2353 by Michail Sermon, RN Outcome: Progressing Goal: Will not experience complications related to urinary retention 01/25/2021 0555 by Michail Sermon, RN Outcome: Progressing 01/24/2021 2353 by Michail Sermon, RN Outcome: Progressing   Problem: Pain Managment: Goal: General experience of comfort will improve 01/25/2021 0555 by Michail Sermon, RN Outcome: Progressing 01/24/2021 2353 by Michail Sermon, RN Outcome: Progressing   Problem: Safety: Goal: Ability to remain free from injury will improve 01/25/2021 0555 by Michail Sermon, RN Outcome: Progressing 01/24/2021 2353 by Michail Sermon, RN Outcome: Progressing   Problem: Skin Integrity: Goal: Risk for impaired skin integrity will decrease 01/25/2021 0555 by Michail Sermon, RN Outcome: Progressing 01/24/2021 2353 by Michail Sermon, RN Outcome: Progressing

## 2021-01-25 NOTE — Interval H&P Note (Signed)
History and Physical Interval Note:  01/25/2021 12:57 PM  Anthony Fox  has presented today for surgery, with the diagnosis of melena, symptomatic anemia, hemoccult positive stool.  The various methods of treatment have been discussed with the patient and family. After consideration of risks, benefits and other options for treatment, the patient has consented to  Procedure(s) with comments: ESOPHAGOGASTRODUODENOSCOPY (EGD) WITH PROPOFOL (N/A) - melena, symptomatic anemia, hemoccult positive stool as a surgical intervention.  The patient's history has been reviewed, patient examined, no change in status, stable for surgery.  I have reviewed the patient's chart and labs.  Questions were answered to the patient's satisfaction.     [place], [place]

## 2021-01-26 ENCOUNTER — Encounter: Payer: Self-pay | Admitting: Internal Medicine

## 2021-01-26 DIAGNOSIS — E538 Deficiency of other specified B group vitamins: Secondary | ICD-10-CM | POA: Diagnosis not present

## 2021-01-26 DIAGNOSIS — D500 Iron deficiency anemia secondary to blood loss (chronic): Secondary | ICD-10-CM | POA: Diagnosis not present

## 2021-01-26 DIAGNOSIS — I959 Hypotension, unspecified: Secondary | ICD-10-CM | POA: Diagnosis not present

## 2021-01-26 DIAGNOSIS — N183 Chronic kidney disease, stage 3 unspecified: Secondary | ICD-10-CM | POA: Diagnosis not present

## 2021-01-26 DIAGNOSIS — D62 Acute posthemorrhagic anemia: Secondary | ICD-10-CM | POA: Diagnosis not present

## 2021-01-26 DIAGNOSIS — I5032 Chronic diastolic (congestive) heart failure: Secondary | ICD-10-CM | POA: Diagnosis not present

## 2021-01-26 DIAGNOSIS — E1122 Type 2 diabetes mellitus with diabetic chronic kidney disease: Secondary | ICD-10-CM | POA: Diagnosis not present

## 2021-01-26 DIAGNOSIS — I739 Peripheral vascular disease, unspecified: Secondary | ICD-10-CM | POA: Diagnosis not present

## 2021-01-26 LAB — BPAM RBC
Blood Product Expiration Date: 202204212359
Blood Product Expiration Date: 202205012359
Blood Product Expiration Date: 202205012359
ISSUE DATE / TIME: 202204032318
ISSUE DATE / TIME: 202204040206
ISSUE DATE / TIME: 202204041508
Unit Type and Rh: 1700
Unit Type and Rh: 7300
Unit Type and Rh: 7300

## 2021-01-26 LAB — TYPE AND SCREEN
ABO/RH(D): B POS
Antibody Screen: NEGATIVE
Unit division: 0
Unit division: 0
Unit division: 0

## 2021-01-26 LAB — GLUCOSE, CAPILLARY
Glucose-Capillary: 146 mg/dL — ABNORMAL HIGH (ref 70–99)
Glucose-Capillary: 202 mg/dL — ABNORMAL HIGH (ref 70–99)

## 2021-01-26 LAB — CBC
HCT: 30.2 % — ABNORMAL LOW (ref 39.0–52.0)
Hemoglobin: 8.5 g/dL — ABNORMAL LOW (ref 13.0–17.0)
MCH: 20.6 pg — ABNORMAL LOW (ref 26.0–34.0)
MCHC: 28.1 g/dL — ABNORMAL LOW (ref 30.0–36.0)
MCV: 73.3 fL — ABNORMAL LOW (ref 80.0–100.0)
Platelets: 219 10*3/uL (ref 150–400)
RBC: 4.12 MIL/uL — ABNORMAL LOW (ref 4.22–5.81)
RDW: 23.6 % — ABNORMAL HIGH (ref 11.5–15.5)
WBC: 11.7 10*3/uL — ABNORMAL HIGH (ref 4.0–10.5)
nRBC: 1 % — ABNORMAL HIGH (ref 0.0–0.2)

## 2021-01-26 LAB — BASIC METABOLIC PANEL
Anion gap: 8 (ref 5–15)
BUN: 60 mg/dL — ABNORMAL HIGH (ref 8–23)
CO2: 22 mmol/L (ref 22–32)
Calcium: 9.3 mg/dL (ref 8.9–10.3)
Chloride: 108 mmol/L (ref 98–111)
Creatinine, Ser: 1.97 mg/dL — ABNORMAL HIGH (ref 0.61–1.24)
GFR, Estimated: 36 mL/min — ABNORMAL LOW (ref >60)
Glucose, Bld: 152 mg/dL — ABNORMAL HIGH (ref 70–99)
Potassium: 4.8 mmol/L (ref 3.5–5.1)
Sodium: 138 mmol/L (ref 135–145)

## 2021-01-26 MED ORDER — INSULIN NPH (HUMAN) (ISOPHANE) 100 UNIT/ML ~~LOC~~ SUSP: 14.0000 [IU] | Freq: Two times a day (BID) | SUBCUTANEOUS | 11 refills | Status: DC

## 2021-01-26 MED ORDER — PANTOPRAZOLE SODIUM 40 MG PO TBEC: 40.0000 mg | DELAYED_RELEASE_TABLET | Freq: Every day | ORAL | 0 refills | Status: DC

## 2021-01-26 MED ORDER — CARVEDILOL 6.25 MG PO TABS
6.2500 mg | ORAL_TABLET | Freq: Two times a day (BID) | ORAL | 0 refills | Status: DC
Start: 2021-01-26 — End: 2021-01-29

## 2021-01-26 MED ORDER — OMEPRAZOLE MAGNESIUM 20 MG PO TBEC: 20.0000 mg | DELAYED_RELEASE_TABLET | Freq: Every day | ORAL | 0 refills | Status: DC

## 2021-01-26 MED ORDER — MUPIROCIN 2 % EX OINT: 1.0000 "application " | TOPICAL_OINTMENT | Freq: Two times a day (BID) | CUTANEOUS | 0 refills | Status: DC

## 2021-01-26 MED ORDER — VITAMIN B-12 1000 MCG PO TABS: 1000.0000 ug | ORAL_TABLET | Freq: Every day | ORAL | 0 refills | Status: DC

## 2021-01-26 MED ORDER — CARVEDILOL 6.25 MG PO TABS
6.2500 mg | ORAL_TABLET | Freq: Two times a day (BID) | ORAL | Status: DC
  Administered 2021-01-26: 6.25 mg via ORAL

## 2021-01-26 NOTE — Plan of Care (Signed)
Patient is being discharged home to self care. Discharge instruction given, patient educated on Doctors instructions, and medications. IV have been removed. Wife will be transportation home

## 2021-01-26 NOTE — Discharge Summary (Signed)
[facility] - [place] at [facility]   PATIENT NAME: Anthony Fox    MR#:  [id number]  DATE OF BIRTH:  Jun 06, 1951  DATE OF ADMISSION:  01/24/2021 ADMITTING PHYSICIAN: Darlin Priestly, MD  DATE OF DISCHARGE: 01/26/2021  2:04 PM  PRIMARY CARE PHYSICIAN: Laural Benes A, PA    ADMISSION DIAGNOSIS:  Melena [K92.1] Iron deficiency anemia due to chronic blood loss [D50.0] Symptomatic anemia [D64.9]  DISCHARGE DIAGNOSIS:  Active Problems:   CKD stage 3 due to type 2 diabetes mellitus (HCC)   Acute on chronic blood loss anemia   Iron deficiency anemia due to chronic blood loss   B12 deficiency   Hypotension   Chronic diastolic CHF (congestive heart failure) (HCC)   SECONDARY DIAGNOSIS:   Past Medical History:  Diagnosis Date  . Anemia due to chronic kidney disease   . Arrhythmia    atrial fibrillation  . CHF (congestive heart failure) (HCC)   . CKD (chronic kidney disease), stage III (HCC)   . Hyperlipidemia   . Hyperlipidemia associated with type 2 diabetes mellitus (HCC)   . Hypertension associated with diabetes (HCC)   . Insulin dependent type 2 diabetes mellitus (HCC)   . PAD (peripheral artery disease) (HCC)   . PVD (peripheral vascular disease) (HCC)     HOSPITAL COURSE:   1.  Acute on chronic blood loss anemia.  EGD on 01/26/2020 by Dr. Norma Fredrickson did show an angiectasia in the duodenum that was cauterized.  Patient tolerating diet.  Initial hemoglobin was 5.1.  After 2 units of transfusion the patient's hemoglobin came up to 7.1.  I did give 1/3 unit of blood and hemoglobin today up to 8.5.  Ferritin was very low at 4.  I did give a dose of IV iron here in the hospital.  As per gastroenterology okay to go back on aspirin and Plavix.  Will refer to hematology as outpatient keep a close eye on blood counts. 2.  Vitamin B12 deficiency.  B12 level on 10/06/2020 is 126.  I did give 2 doses of IM B12 while here in the hospital and converted over to oral upon  disposition. 3.  Relative hypotension on presentation.  All of his blood pressure medications were held initially.  Blood pressure started coming up so I restarted Coreg at a lower dose 6.25 mg twice a day and go back on lisinopril.  If blood pressure remains high as outpatient hopefully can go up on the Coreg dose and restart hydralazine at that time. 4.  Peripheral vascular disease.  We held aspirin and Plavix in the hospital.  As per gastroenterology can go back on as outpatient. 5.  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  The patient's creatinine did improve from 2.59 down to 1.97.  Patient sugars were variable during the hospital course.  Can go back on the lower dose NPH insulin as outpatient. 6.  Chronic diastolic congestive heart failure.  I did give IV Lasix with the third unit of blood.  Can go back on torsemide and lisinopril as outpatient.  Restarted Coreg here in the hospital.   DISCHARGE CONDITIONS:   Satisfactory  CONSULTS OBTAINED:  Gastroenterology  DRUG ALLERGIES:   Allergies  Allergen Reactions  . Minocycline Rash    DISCHARGE MEDICATIONS:   Allergies as of 01/26/2021      Reactions   Minocycline Rash      Medication List    STOP taking these medications   amLODipine 5 MG  tablet Commonly known as: NORVASC   hydrALAZINE 50 MG tablet Commonly known as: APRESOLINE     TAKE these medications   aspirin 81 MG chewable tablet Chew 81 mg by mouth daily.   atorvastatin 40 MG tablet Commonly known as: LIPITOR Take 40 mg by mouth at bedtime.   carvedilol 6.25 MG tablet Commonly known as: COREG Take 1 tablet (6.25 mg total) by mouth 2 (two) times daily with a meal. What changed:   medication strength  how much to take  when to take this   clopidogrel 75 MG tablet Commonly known as: PLAVIX Take 75 mg by mouth daily.   ferrous sulfate 325 (65 FE) MG tablet Take 325 mg by mouth daily with breakfast.   gabapentin 300 MG  capsule Commonly known as: NEURONTIN Take 300 mg by mouth at bedtime.   insulin NPH Human 100 UNIT/ML injection Commonly known as: NOVOLIN N Inject 0.14 mLs (14 Units total) into the skin 2 (two) times daily before a meal. What changed: See the new instructions.   lisinopril 5 MG tablet Commonly known as: ZESTRIL Take 5 mg by mouth daily.   Lokelma 10 g Pack packet Generic drug: sodium zirconium cyclosilicate Take 10 g by mouth every other day.   mupirocin ointment 2 % Commonly known as: BACTROBAN Place 1 application into the nose 2 (two) times daily.   pantoprazole 40 MG tablet Commonly known as: Protonix Take 1 tablet (40 mg total) by mouth daily.   torsemide 20 MG tablet Commonly known as: DEMADEX Take 2 tablets (40 mg total) by mouth daily.   vitamin B-12 1000 MCG tablet Commonly known as: CYANOCOBALAMIN Take 1 tablet (1,000 mcg total) by mouth daily.        DISCHARGE INSTRUCTIONS:   Follow-up with PMD 5 days Follow-up with your cardiologist Follow-up with hematology as outpatient  If you experience worsening of your admission symptoms, develop shortness of breath, life threatening emergency, suicidal or homicidal thoughts you must seek medical attention immediately by calling 911 or calling your MD immediately  if symptoms less severe.  You Must read complete instructions/literature along with all the possible adverse reactions/side effects for all the Medicines you take and that have been prescribed to you. Take any new Medicines after you have completely understood and accept all the possible adverse reactions/side effects.   Please note  You were cared for by a hospitalist during your hospital stay. If you have any questions about your discharge medications or the care you received while you were in the hospital after you are discharged, you can call the unit and asked to speak with the hospitalist on call if the hospitalist that took care of you is not  available. Once you are discharged, your primary care physician will handle any further medical issues. Please note that NO REFILLS for any discharge medications will be authorized once you are discharged, as it is imperative that you return to your primary care physician (or establish a relationship with a primary care physician if you do not have one) for your aftercare needs so that they can reassess your need for medications and monitor your lab values.    Today   CHIEF COMPLAINT:   Chief Complaint  Patient presents with  . Near Syncope    HISTORY OF PRESENT ILLNESS:  Anthony Fox  is a 70 y.o. male presented with near syncope and found to have a hemoglobin of 5.1   VITAL SIGNS:  Blood pressure 135/71, pulse 86,  temperature 97.9 F (36.6 C), resp. rate 17, height 6\' 3"  (1.905 m), weight 94.3 kg, SpO2 100 %.  I/O:    Intake/Output Summary (Last 24 hours) at 01/26/2021 1643 Last data filed at 01/26/2021 1004 Gross per 24 hour  Intake 904.67 ml  Output 1600 ml  Net -695.33 ml    PHYSICAL EXAMINATION:  GENERAL:  70 y.o.-year-old patient lying in the bed with no acute distress.  EYES: Pupils equal, round, reactive to light and accommodation.  HEENT: Head atraumatic, normocephalic. Oropharynx and nasopharynx clear.   LUNGS: Normal breath sounds bilaterally, no wheezing, rales,rhonchi or crepitation. No use of accessory muscles of respiration.  CARDIOVASCULAR: S1, S2 normal. No murmurs, rubs, or gallops.  ABDOMEN: Soft, non-tender, non-distended.  EXTREMITIES: No pedal edema.  NEUROLOGIC: Cranial nerves II through XII are intact. Muscle strength 5/5 in all extremities. Sensation intact. Gait not checked.  PSYCHIATRIC: The patient is alert and oriented x 3.  SKIN: No obvious rash, lesion, or ulcer.   DATA REVIEW:   CBC Recent Labs  Lab 01/26/21 0747  WBC 11.7*  HGB 8.5*  HCT 30.2*  PLT 219    Chemistries  Recent Labs  Lab 01/24/21 1451 01/25/21 0730  01/26/21 0747  NA 137   < > 138  K 4.9   < > 4.8  CL 108   < > 108  CO2 20*   < > 22  GLUCOSE 144*   < > 152*  BUN 87*   < > 60*  CREATININE 2.59*   < > 1.97*  CALCIUM 8.9   < > 9.3  AST 19  --   --   ALT 11  --   --   ALKPHOS 75  --   --   BILITOT 1.1  --   --    < > = values in this interval not displayed.    Microbiology Results  Results for orders placed or performed during the hospital encounter of 01/24/21  SARS CORONAVIRUS 2 (TAT 6-24 HRS) Nasopharyngeal Nasopharyngeal Swab     Status: None   Collection Time: 01/24/21  5:23 PM   Specimen: Nasopharyngeal Swab  Result Value Ref Range Status   SARS Coronavirus 2 NEGATIVE NEGATIVE Final    Comment: (NOTE) SARS-CoV-2 target nucleic acids are NOT DETECTED.  The SARS-CoV-2 RNA is generally detectable in upper and lower respiratory specimens during the acute phase of infection. Negative results do not preclude SARS-CoV-2 infection, do not rule out co-infections with other pathogens, and should not be used as the sole basis for treatment or other patient management decisions. Negative results must be combined with clinical observations, patient history, and epidemiological information. The expected result is Negative.  Fact Sheet for Patients: HairSlick.nohttps://www.fda.gov/media/138098/download  Fact Sheet for Healthcare Providers: quierodirigir.comhttps://www.fda.gov/media/138095/download  This test is not yet approved or cleared by the [place] FDA and  has been authorized for detection and/or diagnosis of SARS-CoV-2 by FDA under an Emergency Use Authorization (EUA). This EUA will remain  in effect (meaning this test can be used) for the duration of the COVID-19 declaration under Se ction 564(b)(1) of the Act, 21 U.S.C. section 360bbb-3(b)(1), unless the authorization is terminated or revoked sooner.  Performed at [facility], [address] [place], [place] [address]   MRSA PCR Screening     Status: Abnormal   Collection  Time: 01/24/21 11:52 PM   Specimen: Nasal Mucosa; Nasopharyngeal  Result Value Ref Range Status   MRSA by PCR POSITIVE (A) NEGATIVE  Final    Comment:        The GeneXpert MRSA Assay (FDA approved for NASAL specimens only), is one component of a comprehensive MRSA colonization surveillance program. It is not intended to diagnose MRSA infection nor to guide or monitor treatment for MRSA infections. RESULT CALLED TO, READ BACK BY AND VERIFIED WITH: Zettie Cooley RN [id number] 01/25/2021 JG Performed at [facility], [address], [place], [place] [address]      Management plans discussed with the patient, family and they are in agreement.  CODE STATUS:     Code Status Orders  (From admission, onward)         Start     Ordered   01/24/21 2039  Full code  Continuous        01/24/21 2038        Code Status History    Date Active Date Inactive Code Status Order ID Comments User Context   10/05/2020 2225 10/09/2020 2103 Full Code [id number]  Charlsie Quest, MD ED   Advance Care Planning Activity      TOTAL TIME TAKING CARE OF THIS PATIENT: 35 minutes.    Alford Highland M.D on 01/26/2021 at 4:43 PM  Between 7am to 6pm - Pager - [phone]  After 6pm go to [email or web address] - password [email or web address]  [facility]  CC: Primary care physician; Karrie Doffing, PA

## 2021-01-26 NOTE — Discharge Instructions (Signed)
Recommend outpatient sleep study to r/o sleep apnea  Iron Deficiency Anemia, Adult Iron deficiency anemia is when you do not have enough red blood cells or hemoglobin in your blood. This happens because you have too little iron in your body. Hemoglobin carries oxygen to parts of the body. Anemia can cause your body to not get enough oxygen. What are the causes?  Not eating enough foods that have iron in them.  The body not being able to take in iron well.  Needing more iron due to pregnancy or heavy menstrual periods, for females.  Cancer.  Bleeding in the bowels.  Many blood draws. What increases the risk?  Being pregnant.  Being a teenage girl going through a growth spurt. What are the signs or symptoms?  Pale skin, lips, and nails.  Weakness, dizziness, and getting tired easily.  Headache.  Feeling like you cannot breathe well when moving (shortness of breath).  Cold hands and feet.  Fast heartbeat or a heartbeat that is not regular.  Feeling grouchy (irritable) or breathing fast. These are more common in very bad anemia. Mild anemia may not cause any symptoms. How is this treated? This condition is treated by finding out why you do not have enough iron and then getting more iron. It may include:  Adding foods to your diet that have a lot of iron.  Taking iron pills (supplements). If you are pregnant or breastfeeding, you may need to take extra iron. Your diet often does not provide the amount of iron that you need.  Getting more vitamin C in your diet. Vitamin C helps your body take in iron. You may need to take iron pills with a glass of orange juice or vitamin C pills.  Medicines to make heavy menstrual periods lighter.  Surgery. You may need blood tests to see if treatment is working. If the treatment does not seem to be working, you may need more tests. Follow these instructions at home: Medicines  Take over-the-counter and prescription medicines only as  told by your doctor. This includes iron pills and vitamins. ? Take iron pills when your stomach is empty. If you cannot handle this, take them with food. ? Do not drink milk or take antacids at the same time as your iron pills. ? Iron pills may turn your poop (stool)black.  If you cannot handle taking iron pills by mouth, ask your doctor about getting iron through: ? An IV tube. ? A shot (injection) into a muscle. Eating and drinking  Talk with your doctor before changing the foods you eat. He or she may tell you to eat foods that have a lot of iron, such as: ? Liver. ? Low-fat (lean) beef. ? Breads and cereals that have iron added to them. ? Eggs. ? Dried fruit. ? Dark green, leafy vegetables.  Eat fresh fruits and vegetables that are high in vitamin C. They help your body use iron. Foods with a lot of vitamin C include: ? Oranges. ? Peppers. ? Tomatoes. ? Mangoes.  Drink enough fluid to keep your pee (urine) pale yellow.   Managing constipation If you are taking iron pills, they may cause trouble pooping (constipation). To prevent or treat trouble pooping, you may need to:  Take over-the-counter or prescription medicines.  Eat foods that are high in fiber. These include beans, whole grains, and fresh fruits and vegetables.  Limit foods that are high in fat and sugar. These include fried or sweet foods. General instructions  Return  to your normal activities as told by your doctor. Ask your doctor what activities are safe for you.  Keep yourself clean, and keep things clean around you.  Keep all follow-up visits as told by your doctor. This is important. Contact a doctor if:  You feel like you may vomit (nauseous), or you vomit.  You feel weak.  You are sweating for no reason.  You have trouble pooping, such as: ? Pooping less than 3 times a week. ? Straining to poop. ? Having poop that is hard, dry, or larger than normal. ? Feeling full or bloated. ? Pain in the  lower belly. ? Not feeling better after pooping. Get help right away if:  You pass out (faint).  You have chest pain.  You have trouble breathing that: ? Is very bad. ? Gets worse with physical activity.  You have a fast heartbeat, or a heartbeat that does not feel regular.  You get light-headed when getting up from sitting or lying down. These symptoms may be an emergency. Do not wait to see if the symptoms will go away. Get medical help right away. Call your local emergency services (911 in the [place] Do not drive yourself to the hospital. Summary  Iron deficiency anemia is when you have too little iron in your body.  This condition is treated by finding out why you do not have enough iron in your body and then getting more iron.  Take over-the-counter and prescription medicines only as told by your doctor.  Eat fresh fruits and vegetables that are high in vitamin C.  Get help right away if you cannot breathe well. This information is not intended to replace advice given to you by your health care provider. Make sure you discuss any questions you have with your health care provider. Document Revised: 06/18/2019 Document Reviewed: 06/18/2019 Elsevier Patient Education  2021 [employer].

## 2021-01-28 ENCOUNTER — Encounter: Payer: Self-pay | Admitting: Physician Assistant

## 2021-01-28 NOTE — Progress Notes (Signed)
Cardiology Office Note    Date:  01/29/2021   ID:  Anthony Fox, DOB [birth year], MRN [id number]  PCP:  Samara Snide, PA  Cardiologist:  Kate Sable, MD  Electrophysiologist:  None   Chief Complaint: Follow-up  History of Present Illness:   Anthony Fox is a 70 y.o. male with history of HFpEF, PAF not on [place] secondary to high bleeding risk, GI bleed, and underlying anemia, PAD with extensive interventions including left transmetatarsal and right first and second toe amputations followed by [facility], IDDM2, CKD stage III with recurrent hyperkalemia, HTN, HLD, anemia of chronic disease with GI bleeding requiring packed red blood cell transfusion, and prior tobacco use who presents for follow-up of diastolic CHF.  We first met him during an admission in 11/2019 for acute hypoxic respiratory failure in the setting of pulmonary edema exacerbated by A. fib.  At that time echo showed an EF of 55 to 60%, no regional wall motion abnormalities, mild LVH, indeterminate LV diastolic function parameters, mild biatrial enlargement, moderately reduced RV systolic function with mildly enlarged RV cavity size, mildly elevated PASP, mild mitral regurgitation, mild to moderate tricuspid regurgitation, and an estimated right atrial pressure of 15 mmHg.  He was diuresed with symptomatic improvement.  Given an elevated high-sensitivity troponin peaking at 1230, nuclear stress test was performed and showed no evidence of ischemia.  VQ scan was negative for PE.  He was noted to have PAF on telemetry.  Anticoagulation was not recommended for his A. fib upon discharge given high risk of bleeding and underlying anemia.  Given his extensive PAD aspirin and clopidogrel were continued.  Following hospital discharge, when he was seen in our office in follow-up he was volume overloaded leading his furosemide to be transitioned to torsemide which was subsequently titrated due to persistent fluid overload.  He was most  recently seen in our office in 11/2020 with improved volume status on torsemide 40 mg daily with a documented weight of 233 pounds.  He was maintaining sinus rhythm.  He was recently admitted to [facility] from 4/3 through 4/5 with symptomatic anemia with melena, hypovolemic hypotension, and acute on CKD.  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  His initial hemoglobin was 5.1 and he required 3 units of packed red blood cell transfusion as well as IV iron.  High-sensitivity troponin was minimally elevated at 34 with a delta of 31.  BNP 764.  With transfusion he was gently diuresed.  Discharge summary indicates GI authorized the patient to go back on aspirin and Plavix.  In the setting of his hypovolemia his BP was soft leading his Coreg and lisinopril to be briefly held though he was subsequently restarted on lisinopril and lower dose Coreg prior to discharge.  He comes in doing well from a cardiac perspective.  Since his discharge earlier this week he feels like his fatigue and strength are improving.  He has not had any hematochezia or melena, though he does report his stools are dark secondary to iron supplementation.  He denies any hemoptysis, hematuria, or hematemesis.  No chest pain, palpitations, dyspnea, dizziness, presyncope, or syncope.  No lower extremity swelling, abdominal distention, orthopnea, PND, or early satiety.  When compared to his last office visit with [place], his weight is down 12 pounds, which is likely in the setting of his recent hospitalization.  He is tolerating medications without issues.  He has follow-up with [facility] later this month.   Labs independently reviewed:  01/2021 - potassium 4.8, BUN 60, serum creatinine 1.97, Hgb 8.5, PLT 219, albumin 3.9, AST/ALT normal 09/2020 - magnesium 2.1, A1c 8.1, TSH normal, TC 101, TG 62, HDL 28, LDL 61  Past Medical History:  Diagnosis Date  . Anemia due to chronic kidney disease   . Arrhythmia    atrial  fibrillation  . CHF (congestive heart failure) ([place])   . CKD (chronic kidney disease), stage III ([place])   . Hyperlipidemia associated with type 2 diabetes mellitus ([place])   . Hypertension associated with diabetes ([place])   . Insulin dependent type 2 diabetes mellitus ([place])   . PAD (peripheral artery disease) ([place])     Past Surgical History:  Procedure Laterality Date  . ESOPHAGOGASTRODUODENOSCOPY (EGD) WITH PROPOFOL N/A 01/25/2021   Procedure: ESOPHAGOGASTRODUODENOSCOPY (EGD) WITH PROPOFOL;  Surgeon: Toledo, Benay Pike, MD;  Location: [facility] ENDOSCOPY;  Service: Gastroenterology;  Laterality: N/A;  melena, symptomatic anemia, hemoccult positive stool  . PERIPHERAL ARTERIAL STENT GRAFT Bilateral   . 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    Current Medications: Current Meds  Medication Sig  . aspirin 81 MG chewable tablet Chew 81 mg by mouth daily.  Marland Kitchen atorvastatin (LIPITOR) 40 MG tablet Take 40 mg by mouth at bedtime.  . clopidogrel (PLAVIX) 75 MG tablet Take 75 mg by mouth daily.  . ferrous sulfate 325 (65 FE) MG tablet Take 325 mg by mouth daily with breakfast.  . gabapentin (NEURONTIN) 300 MG capsule Take 300 mg by mouth at bedtime.  . insulin NPH Human (NOVOLIN N) 100 UNIT/ML injection Inject 0.14 mLs (14 Units total) into the skin 2 (two) times daily before a meal.  . lisinopril (ZESTRIL) 5 MG tablet Take 5 mg by mouth daily.  . metoprolol tartrate (LOPRESSOR) 50 MG tablet Take 1 tablet (50 mg total) by mouth 2 (two) times daily.  . mupirocin ointment (BACTROBAN) 2 % Place 1 application into the nose 2 (two) times daily.  . pantoprazole (PROTONIX) 40 MG tablet Take 1 tablet (40 mg total) by mouth daily.  . sodium zirconium cyclosilicate (LOKELMA) 10 g PACK packet Take 10 g by mouth every other day.  . torsemide (DEMADEX) 20 MG tablet Take 2 tablets (40 mg total) by mouth daily.  . vitamin B-12 (CYANOCOBALAMIN) 1000 MCG tablet Take 1 tablet (1,000 mcg total) by mouth daily.  .  [DISCONTINUED] carvedilol (COREG) 6.25 MG tablet Take 1 tablet (6.25 mg total) by mouth 2 (two) times daily with a meal.    Allergies:   Minocycline   Social History   Socioeconomic History  . Marital status: Married    Spouse name: Not on file  . Number of children: Not on file  . Years of education: Not on file  . Highest education level: Not on file  Occupational History  . Not on file  Tobacco Use  . Smoking status: Former Smoker    Years: 1.00  . Smokeless tobacco: Never Used  . Tobacco comment: 10/05/20: Quit many years ago  Vaping Use  . Vaping Use: Never used  Substance and Sexual Activity  . Alcohol use: Never  . Drug use: Never  . Sexual activity: Not on file  Other Topics Concern  . Not on file  Social History Narrative  . Not on file   Social Determinants of Health   Financial Resource Strain: Not on file  Food Insecurity: Not on file  Transportation Needs: Not on file  Physical Activity: Not on file  Stress: Not on file  Social  Connections: Not on file     Family History:  The patient's family history includes Diabetes in his brother and mother; Hypertension in his mother.  ROS:   Review of Systems  Constitutional: Positive for malaise/fatigue. Negative for chills, diaphoresis, fever and weight loss.       Improving fatigue  HENT: Negative for congestion.   Eyes: Negative for discharge and redness.  Respiratory: Negative for cough, hemoptysis, sputum production, shortness of breath and wheezing.   Cardiovascular: Negative for chest pain, palpitations, orthopnea, claudication, leg swelling and PND.  Gastrointestinal: Negative for abdominal pain, blood in stool, heartburn, melena, nausea and vomiting.  Genitourinary: Negative for hematuria.  Musculoskeletal: Negative for falls and myalgias.  Skin: Negative for rash.  Neurological: Positive for weakness. Negative for dizziness, tingling, tremors, sensory change, speech change, focal weakness and loss  of consciousness.  Endo/Heme/Allergies: Does not bruise/bleed easily.  Psychiatric/Behavioral: Negative for substance abuse. The patient is not nervous/anxious.   All other systems reviewed and are negative.    EKGs/Labs/Other Studies Reviewed:    Studies reviewed were summarized above. The additional studies were reviewed today:  2D echo 09/2020: 1. Left ventricular ejection fraction, by estimation, is 55 to 60%. The  left ventricle has normal function. The left ventricle has no regional  wall motion abnormalities. There is mild left ventricular hypertrophy.  Left ventricular diastolic parameters  are indeterminate.  2. Right ventricular systolic function is moderately reduced. The right  ventricular size is mildly enlarged. There is mildly elevated pulmonary  artery systolic pressure.  3. Left atrial size was mildly dilated.  4. Right atrial size was mildly dilated.  5. A small pericardial effusion is present.  6. The mitral valve is normal in structure. Mild mitral valve  regurgitation. No evidence of mitral stenosis.  7. Tricuspid valve regurgitation is mild to moderate.  8. The aortic valve is tricuspid. Aortic valve regurgitation is not  visualized. No aortic stenosis is present.  9. The inferior vena cava is dilated in size with <50% respiratory  variability, suggesting right atrial pressure of 15 mmHg. __________  Carlton Adam MPI 09/2020:  There was no ST segment deviation noted during stress.  The study is normal.  This is a low risk study.  The left ventricular ejection fraction is normal (55-65%).  CT attenuation images showed mild chronic calcifications.   EKG:  EKG is ordered today.  The EKG ordered today demonstrates atrial flutter with variable AV block, 99 bpm, rare PVC, nonspecific ST-T changes  Recent Labs: 10/06/2020: Magnesium 2.1; TSH 0.451 01/24/2021: ALT 11; B Natriuretic Peptide 764.5 01/26/2021: BUN 60; Creatinine, Ser 1.97; Hemoglobin 8.5;  Platelets 219; Potassium 4.8; Sodium 138  Recent Lipid Panel    Component Value Date/Time   CHOL 101 10/06/2020 0435   TRIG 62 10/06/2020 0435   HDL 28 (L) 10/06/2020 0435   CHOLHDL 3.6 10/06/2020 0435   VLDL 12 10/06/2020 0435   LDLCALC 61 10/06/2020 0435    PHYSICAL EXAM:    VS:  BP 118/80 (BP Location: Left Arm, Patient Position: Sitting, Cuff Size: Normal)   Pulse 99   Ht _0  (1.905 m)   Wt 221 lb (100.2 kg)   SpO2 96%   BMI 27.62 kg/m   BMI: Body mass index is 27.62 kg/m.  Physical Exam Vitals reviewed.  Constitutional:      Appearance: He is well-developed.  HENT:     Head: Normocephalic and atraumatic.  Eyes:     General:  Right eye: No discharge.        Left eye: No discharge.  Neck:     Vascular: No JVD.  Cardiovascular:     Rate and Rhythm: Normal rate. Rhythm irregular.     Pulses: No midsystolic click and no opening snap.          Posterior tibial pulses are 1+ on the right side and 1+ on the left side.     Heart sounds: S1 normal and S2 normal. Heart sounds not distant. Murmur heard.   Systolic murmur is present with a grade of 1/6 at the lower right sternal border. No friction rub.     Comments: Compression stockings noted Pulmonary:     Effort: Pulmonary effort is normal. No respiratory distress.     Breath sounds: Normal breath sounds. No decreased breath sounds, wheezing or rales.  Chest:     Chest wall: No tenderness.  Abdominal:     General: There is no distension.     Palpations: Abdomen is soft.     Tenderness: There is no abdominal tenderness.  Musculoskeletal:     Cervical back: Normal range of motion.  Skin:    General: Skin is warm and dry.     Nails: There is no clubbing.  Neurological:     Mental Status: He is alert and oriented to person, place, and time.  Psychiatric:        Speech: Speech normal.        Behavior: Behavior normal.        Thought Content: Thought content normal.        Judgment: Judgment normal.      Wt Readings from Last 3 Encounters:  01/29/21 221 lb (100.2 kg)  01/24/21 208 lb (94.3 kg)  12/17/20 231 lb 4 oz (104.9 kg)     ASSESSMENT & PLAN:   1. HFpEF: He appears euvolemic and well compensated.  His weight is down 12 pounds when compared to his last clinic visit.  This is likely in the setting of poor p.o. intake leading to his most recent hospitalization.  He remains on torsemide 40 mg daily.  Check BMP as outlined below.  CHF education.  2. PAF/atrial flutter: He has a history of PAF documented in care everywhere at [facility].  More recently, he was noted to have PAF during his hospitalization in 09/2020.  He was not placed on anticoagulation given high risk of bleeding, underlying anemia, and in the presence of DAPT with severe PAD.  In the office today he was noted to be in new onset atrial flutter with variable AV block and was completely asymptomatic.  Ventricular rates in the upper 90s bpm.  In an effort to improve rate control we will transition him from carvedilol to Lopressor 50 mg twice daily.  Given he was recently in the hospital for GI bleed and required 3 units of PRBC oral anticoagulation is contraindicated.  In this setting we will refer him to the structural heart team for consideration of left atrial appendage occluder device.  This was discussed with the patient's primary cardiologist.  Recent TSH normal.  Potassium at goal.  3. PAD: Stable.  Follow-up with [facility] as directed.  He remains on aspirin, clopidogrel, and atorvastatin.  4. HTN: Blood pressure is well controlled in the office today.  He remains on lisinopril, torsemide, and now metoprolol in place of carvedilol for added ventricular rate control as outlined above.  5. HLD: LDL 61 in 09/2020.  He remains  on atorvastatin.  6. Symptomatic anemia with GI bleeding: Status post multiple units of packed red blood cell transfusion.  No symptoms of recurrent bleeding.  Check CBC.  Follow-up with PCP and GI as  directed.  7. CKD stage III: Renal function improved on most recent BMP prior to hospital discharge.  Given documented weight loss we will update a BMP today.   Disposition: F/u with Dr. Garen Lah or an APP in 2 months.   Medication Adjustments/Labs and Tests Ordered: Current medicines are reviewed at length with the patient today.  Concerns regarding medicines are outlined above. Medication changes, Labs and Tests ordered today are summarized above and listed in the Patient Instructions accessible in Encounters.   Signed, Christell Faith, PA-C 01/29/2021 1:03 PM     [address] [place], [place] [address] [phone]

## 2021-01-29 ENCOUNTER — Other Ambulatory Visit
Admission: RE | Admit: 2021-01-29 | Discharge: 2021-01-29 | Disposition: A | Discharge status: Home / Self Care | Payer: Medicare HMO | Attending: Physician Assistant | Admitting: Physician Assistant

## 2021-01-29 ENCOUNTER — Telehealth: Payer: Self-pay

## 2021-01-29 ENCOUNTER — Other Ambulatory Visit: Payer: Self-pay

## 2021-01-29 ENCOUNTER — Telehealth: Payer: Self-pay | Admitting: *Deleted

## 2021-01-29 ENCOUNTER — Ambulatory Visit (INDEPENDENT_AMBULATORY_CARE_PROVIDER_SITE_OTHER): Payer: Medicare HMO | Admitting: Physician Assistant

## 2021-01-29 ENCOUNTER — Encounter: Payer: Self-pay | Admitting: Physician Assistant

## 2021-01-29 VITALS — BP 118/80 | HR 99 | Ht 75.0 in | Wt 221.0 lb

## 2021-01-29 DIAGNOSIS — I5032 Chronic diastolic (congestive) heart failure: Secondary | ICD-10-CM | POA: Insufficient documentation

## 2021-01-29 DIAGNOSIS — I480 Paroxysmal atrial fibrillation: Secondary | ICD-10-CM | POA: Diagnosis not present

## 2021-01-29 DIAGNOSIS — N183 Chronic kidney disease, stage 3 unspecified: Secondary | ICD-10-CM

## 2021-01-29 DIAGNOSIS — I4892 Unspecified atrial flutter: Secondary | ICD-10-CM

## 2021-01-29 DIAGNOSIS — I739 Peripheral vascular disease, unspecified: Secondary | ICD-10-CM | POA: Diagnosis not present

## 2021-01-29 DIAGNOSIS — E785 Hyperlipidemia, unspecified: Secondary | ICD-10-CM

## 2021-01-29 DIAGNOSIS — D500 Iron deficiency anemia secondary to blood loss (chronic): Secondary | ICD-10-CM | POA: Diagnosis not present

## 2021-01-29 DIAGNOSIS — I10 Essential (primary) hypertension: Secondary | ICD-10-CM | POA: Diagnosis not present

## 2021-01-29 LAB — BASIC METABOLIC PANEL
Anion gap: 12 (ref 5–15)
BUN: 62 mg/dL — ABNORMAL HIGH (ref 8–23)
CO2: 21 mmol/L — ABNORMAL LOW (ref 22–32)
Calcium: 9.3 mg/dL (ref 8.9–10.3)
Chloride: 108 mmol/L (ref 98–111)
Creatinine, Ser: 2.25 mg/dL — ABNORMAL HIGH (ref 0.61–1.24)
GFR, Estimated: 31 mL/min — ABNORMAL LOW (ref >60)
Glucose, Bld: 168 mg/dL — ABNORMAL HIGH (ref 70–99)
Potassium: 4.2 mmol/L (ref 3.5–5.1)
Sodium: 141 mmol/L (ref 135–145)

## 2021-01-29 LAB — CBC
HCT: 34.7 % — ABNORMAL LOW (ref 39.0–52.0)
Hemoglobin: 9.2 g/dL — ABNORMAL LOW (ref 13.0–17.0)
MCH: 20.6 pg — ABNORMAL LOW (ref 26.0–34.0)
MCHC: 26.5 g/dL — ABNORMAL LOW (ref 30.0–36.0)
MCV: 77.6 fL — ABNORMAL LOW (ref 80.0–100.0)
Platelets: 218 10*3/uL (ref 150–400)
RBC: 4.47 MIL/uL (ref 4.22–5.81)
RDW: 26.9 % — ABNORMAL HIGH (ref 11.5–15.5)
WBC: 8.4 10*3/uL (ref 4.0–10.5)
nRBC: 0.2 % (ref 0.0–0.2)

## 2021-01-29 MED ORDER — METOPROLOL TARTRATE 50 MG PO TABS: 50.0000 mg | ORAL_TABLET | Freq: Two times a day (BID) | ORAL | 3 refills | Status: DC

## 2021-01-29 NOTE — Patient Instructions (Signed)
Medication Instructions:  Your physician has recommended you make the following change in your medication:   1. STOP Carvediolol 2. START Metoprolol Tartrate 50 mg twice a day  *If you need a refill on your cardiac medications before your next appointment, please call your pharmacy*   Lab Work: CBC, BMET today. Go to [facility] entrance and check in at registration.   If you have labs (blood work) drawn today and your tests are completely normal, you will receive your results only by: Marland Kitchen MyChart Message (if you have MyChart) OR . A paper copy in the mail If you have any lab test that is abnormal or we need to change your treatment, we will call you to review the results.   Testing/Procedures: None   Follow-Up: At [facility], you and your health needs are our priority.  As part of our continuing mission to provide you with exceptional heart care, we have created designated Provider Care Teams.  These Care Teams include your primary Cardiologist (physician) and Advanced Practice Providers (APPs -  Physician Assistants and Nurse Practitioners) who all work together to provide you with the care you need, when you need it.  We recommend signing up for the patient portal called "MyChart".  Sign up information is provided on this After Visit Summary.  MyChart is used to connect with patients for Virtual Visits (Telemedicine).  Patients are able to view lab/test results, encounter notes, upcoming appointments, etc.  Non-urgent messages can be sent to your provider as well.   To learn more about what you can do with MyChart, go to [email or web address].    Your next appointment:   2 month(s)  The format for your next appointment:   In Person  Provider:   Debbe Odea, MD or Eula Listen, PA-C   Other Instructions Referral placed for [facility] for evaluation.

## 2021-01-29 NOTE — Telephone Encounter (Signed)
No answer/Voicemail box is full on home number. No answer/Voicemail box is not set up on mobile number.

## 2021-01-29 NOTE — Telephone Encounter (Signed)
Per Eula Listen, called to arrange Watchman consult with Dr. Excell Seltzer.  Attempted to call both numbers on file but VM is not set up.  Will try again later.

## 2021-01-29 NOTE — Telephone Encounter (Signed)
-----   Message from Sondra Barges, PA-C sent at 01/29/2021  1:39 PM EDT ----- Hemoglobin remains low though is improved when compared to value at time of hospital discharge. Potassium is at goal. Renal function is slightly worse than his hospital discharge value though appears to be in line with his baseline function.  Given his weight is down 12 pounds when compared to his last visit, in an effort to prevent dehydration and further kidney injury, please have him decrease his torsemide to 20 mg daily.

## 2021-02-01 MED ORDER — TORSEMIDE 20 MG PO TABS: 20.0000 mg | ORAL_TABLET | Freq: Every day | ORAL | 5 refills | Status: DC

## 2021-02-01 NOTE — Telephone Encounter (Signed)
Reviewed results and recommendations with patient. Instructed him to decrease his Torsemide to 1 tablet (20 mg) once daily. He verbalized understanding of these instructions with no further questions at this time.

## 2021-02-01 NOTE — Telephone Encounter (Signed)
Scheduled the patient for Watchman consult with Dr. Excell Seltzer 02/26/21. He was grateful for call and agrees with plan.

## 2021-02-01 NOTE — Telephone Encounter (Signed)
No answer/voicemail box is not set up No answer/Voicemail box is full

## 2021-02-02 DIAGNOSIS — I10 Essential (primary) hypertension: Secondary | ICD-10-CM | POA: Diagnosis not present

## 2021-02-02 DIAGNOSIS — E1151 Type 2 diabetes mellitus with diabetic peripheral angiopathy without gangrene: Secondary | ICD-10-CM | POA: Diagnosis not present

## 2021-02-02 DIAGNOSIS — Q208 Other congenital malformations of cardiac chambers and connections: Secondary | ICD-10-CM | POA: Diagnosis not present

## 2021-02-02 DIAGNOSIS — E1165 Type 2 diabetes mellitus with hyperglycemia: Secondary | ICD-10-CM | POA: Diagnosis not present

## 2021-02-02 DIAGNOSIS — E118 Type 2 diabetes mellitus with unspecified complications: Secondary | ICD-10-CM | POA: Diagnosis not present

## 2021-02-02 DIAGNOSIS — Z794 Long term (current) use of insulin: Secondary | ICD-10-CM | POA: Diagnosis not present

## 2021-02-02 DIAGNOSIS — I7025 Atherosclerosis of native arteries of other extremities with ulceration: Secondary | ICD-10-CM | POA: Diagnosis not present

## 2021-02-02 DIAGNOSIS — E113299 Type 2 diabetes mellitus with mild nonproliferative diabetic retinopathy without macular edema, unspecified eye: Secondary | ICD-10-CM | POA: Diagnosis not present

## 2021-02-02 NOTE — Progress Notes (Signed)
[facility]  [address], [address] [place], [place] [address] Phone: [phone]  Fax: [phone]   Clinic Day:  02/03/2021  Referring physician: [name], [name], PA  Chief Complaint: Anthony Fox is a 70 y.o. male with anemia who is referred in consultation by [name], PA for assessment and management.   HPI: The patient was in the hospital in 05/2013 for severe iron deficiency anemia. Hemoglobin on admission was 4.3. Upper and lower endoscopy on 06/13/2013 revealed no source of bleeding. A capsule endoscopy on 06/14/2013 revealed no bleeding source. His hemoglobin appropriately responded to transfusions.  The patient was admitted to [facility] from 01/24/2021 - 01/26/2021. He presented with near syncope and a hemoglobin of 5.1.  Ferritin was 4.  EGD on 01/25/2021 by Dr. Norma Fredricksonoledo revealed benign-appearing esophageal stenoses. 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 He received 3 units PRBCs.  He received Venofer 400 mg IV.  He received B12 injections x2 and was discharged on oral B12.  Discharge hemoglobin was 8.5. Ferritin was 4.  The patient is on aspirin and Plavix. He has been on it since he had bypass surgery in his legs.  Labs followed: 09/10/2014: Hematocrit 39.4, hemoglobin 12.9, MCV 85.0, platelets 200,000, WBC 6,200. 11/29/2018: ---------------------, hemoglobin 12.6. 02/03/2020: ---------------------, hemoglobin 10.5. 10/06/2020: Hematocrit 28.7, hemoglobin   8.9, MCV 85.2, platelets 196,000, WBC   8,800.  Ferritin 16. Iron saturation 6%. TIBC 364. B12 126 (low). Folate 15.9. 01/24/2021: Hematocrit 20.8, hemoglobin   5.1, MCV 69.6, platelets 225,000, WBC   6,300. 01/26/2021: Hematocrit 30.2, hemoglobin   8.5, MCV 73.3, platelets 219,000, WBC 11,700. 01/29/2021: Hematocrit 34.7, hemoglobin   9.2, MCV 77.6, platelets 218,000, WBC   8,400.  Symptomatically, he feels better since he received  his blood transfusions and IV iron. The patient denies fevers, sweats, headaches, changes in vision, runny nose, sore throat, cough, shortness of breath, chest pain, palpitations, nausea, vomiting, diarrhea, reflux, urinary symptoms, bone or joint symptoms, skin changes, numbness, weakness, balance or coordination problems, and bleeding of any kind.  He eats meat 3 times per week. He does not eat a lot of red meat. He does not eat leafy vegetables. Yesterday, he had a steak biscuit from [employer] and some grits. He drinks a lot of water and eats a lot of green beans, broccoli, and cauliflower. He states that his appetite is good, but he does not eat a lot because he tries to keep his sugar in check.  He has been taking one iron pill daily for years. He does not take it with orange juice or vitamin D. The iron makes his stool black. He denies ice pica and any other cravings. He started taking oral B12 1000 mcg daily once he left the hospital.  He has had toe and foot amputations. He had a transfusion after one of the surgeries.   Colonoscopy on 02/14/2019 by Dr. Roberto ScalesMelissa Rich revealed 2 polyps (adenomatous polyps).   He denies a family history of blood disorders or cancer.   Past Medical History:  Diagnosis Date  . Anemia due to chronic kidney disease   . Arrhythmia    atrial fibrillation  . CHF (congestive heart failure) (HCC)   . CKD (chronic kidney disease), stage III (HCC)   . Hyperlipidemia associated with type 2 diabetes mellitus (HCC)   . Hypertension associated with diabetes (HCC)   . Insulin dependent type 2 diabetes mellitus (HCC)   . PAD (peripheral artery  disease) [facility])     Past Surgical History:  Procedure Laterality Date  . ESOPHAGOGASTRODUODENOSCOPY (EGD) WITH PROPOFOL N/A 01/25/2021   Procedure: ESOPHAGOGASTRODUODENOSCOPY (EGD) WITH PROPOFOL;  Surgeon: Toledo, Boykin Nearing, MD;  Location: [facility] ENDOSCOPY;  Service: Gastroenterology;  Laterality: N/A;  melena, symptomatic anemia,  hemoccult positive stool  . PERIPHERAL ARTERIAL STENT GRAFT Bilateral   . 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 Diabetes Mother   . Hypertension Mother   . Diabetes Brother     Social History:  reports that he has quit smoking. He quit after 1.00 year of use. He has never used smokeless tobacco. He reports that he does not drink alcohol and does not use drugs. He has not drank alcohol since 1995. He quit smoking about 10 years ago. He denies exposure to radiaiton or toxins. He is retired. The patient is accompanied by his sister today.  Allergies:  Allergies  Allergen Reactions  . Minocycline Rash    Current Medications: Current Outpatient Medications  Medication Sig Dispense Refill  . aspirin 81 MG chewable tablet Chew 81 mg by mouth daily.    Marland Kitchen atorvastatin (LIPITOR) 40 MG tablet Take 40 mg by mouth at bedtime.    . clopidogrel (PLAVIX) 75 MG tablet Take 75 mg by mouth daily.    . ferrous sulfate 325 (65 FE) MG tablet Take 325 mg by mouth daily with breakfast.    . gabapentin (NEURONTIN) 300 MG capsule Take 300 mg by mouth at bedtime.    . insulin NPH Human (NOVOLIN N) 100 UNIT/ML injection Inject 0.14 mLs (14 Units total) into the skin 2 (two) times daily before a meal. 10 mL 11  . lisinopril (ZESTRIL) 5 MG tablet Take 5 mg by mouth daily.    . metoprolol tartrate (LOPRESSOR) 50 MG tablet Take 1 tablet (50 mg total) by mouth 2 (two) times daily. 60 tablet 3  . mupirocin ointment (BACTROBAN) 2 % Place 1 application into the nose 2 (two) times daily. 22 g 0  . pantoprazole (PROTONIX) 40 MG tablet Take 1 tablet (40 mg total) by mouth daily. 30 tablet 0  . sodium zirconium cyclosilicate (LOKELMA) 10 g PACK packet Take 10 g by mouth every other day.    . torsemide (DEMADEX) 20 MG tablet Take 1 tablet (20 mg total) by mouth daily. 30 tablet 5  . vitamin B-12 (CYANOCOBALAMIN) 1000 MCG tablet Take 1 tablet (1,000 mcg total) by  mouth daily. 30 tablet 0   No current facility-administered medications for this visit.    Review of Systems  Constitutional: Negative for chills, diaphoresis, fever, malaise/fatigue and weight loss.  HENT: Negative for congestion, ear discharge, ear pain, hearing loss, nosebleeds, sinus pain, sore throat and tinnitus.   Eyes: Negative for blurred vision.  Respiratory: Negative for cough, hemoptysis, sputum production and shortness of breath.   Cardiovascular: Negative for chest pain, palpitations and leg swelling.  Gastrointestinal: Positive for melena (dark on oral iron). Negative for abdominal pain, blood in stool, constipation, diarrhea, heartburn, nausea and vomiting.       Good appetite.  Genitourinary: Negative for dysuria, frequency, hematuria and urgency.  Musculoskeletal: Negative for back pain, joint pain, myalgias and neck pain.  Skin: Negative for itching and rash.  Neurological: Negative for dizziness, tingling, sensory change, weakness and headaches.  Endo/Heme/Allergies: Does not bruise/bleed easily.  Psychiatric/Behavioral: Negative for depression and memory loss. The patient is not nervous/anxious and does not have insomnia.  All other systems reviewed and are negative.  Performance status (ECOG): 1-2  Vitals Blood pressure (!) 123/59, pulse 66, temperature 97.9 F (36.6 C), temperature source Tympanic, resp. rate 16, height  (1.905 m), weight 221 lb (100.2 kg), SpO2 98 %.   Physical Exam Vitals and nursing note reviewed.  Constitutional:      General: He is not in acute distress.    Appearance: He is not diaphoretic.     Comments: Patient sitting comfortably in wheelchair in no acute distress. Able to get onto exam table.  HENT:     Head: Normocephalic and atraumatic.     Mouth/Throat:     Mouth: Mucous membranes are moist.     Pharynx: Oropharynx is clear.  Eyes:     General: No scleral icterus.    Extraocular Movements: Extraocular movements intact.      Conjunctiva/sclera: Conjunctivae normal.     Pupils: Pupils are equal, round, and reactive to light.  Cardiovascular:     Rate and Rhythm: Normal rate and regular rhythm.     Heart sounds: Normal heart sounds. No murmur heard.   Pulmonary:     Effort: Pulmonary effort is normal. No respiratory distress.     Breath sounds: Normal breath sounds. No wheezing or rales.  Chest:     Chest wall: No tenderness.  Breasts:     Right: No axillary adenopathy or supraclavicular adenopathy.     Left: No axillary adenopathy or supraclavicular adenopathy.    Abdominal:     General: Bowel sounds are normal. There is no distension.     Palpations: Abdomen is soft. There is no mass.     Tenderness: There is no abdominal tenderness. There is no guarding or rebound.  Musculoskeletal:        General: No swelling or tenderness. Normal range of motion.     Cervical back: Normal range of motion and neck supple.  Lymphadenopathy:     Head:     Right side of head: No preauricular, posterior auricular or occipital adenopathy.     Left side of head: No preauricular, posterior auricular or occipital adenopathy.     Cervical: No cervical adenopathy.     Upper Body:     Right upper body: No supraclavicular or axillary adenopathy.     Left upper body: No supraclavicular or axillary adenopathy.     Lower Body: No right inguinal adenopathy. No left inguinal adenopathy.  Skin:    General: Skin is warm and dry.  Neurological:     Mental Status: He is alert and oriented to person, place, and time.  Psychiatric:        Behavior: Behavior normal.        Thought Content: Thought content normal.        Judgment: Judgment normal.    No visits with results within 3 Day(s) from this visit.  Latest known visit with results is:  Hospital Outpatient Visit on 01/29/2021  Component Date Value Ref Range Status  . Sodium 01/29/2021 141  135 - 145 mmol/L Final  . Potassium 01/29/2021 4.2  3.5 - 5.1 mmol/L Final  .  Chloride 01/29/2021 108  98 - 111 mmol/L Final  . CO2 01/29/2021 21* 22 - 32 mmol/L Final  . Glucose, Bld 01/29/2021 168* 70 - 99 mg/dL Final   Glucose reference range applies only to samples taken after fasting for at least 8 hours.  . BUN 01/29/2021 62* 8 - 23 mg/dL Final  . Creatinine,  Ser 01/29/2021 2.25* 0.61 - 1.24 mg/dL Final  . Calcium 05/39/7673 9.3  8.9 - 10.3 mg/dL Final  . GFR, Estimated 01/29/2021 31* >60 mL/min Final   Comment: (NOTE) Calculated using the CKD-EPI Creatinine Equation (2021)   . Anion gap 01/29/2021 12  5 - 15 Final   Performed at [facility], [address] [place] [place], [place] [address]  . WBC 01/29/2021 8.4  4.0 - 10.5 K/uL Final  . RBC 01/29/2021 4.47  4.22 - 5.81 MIL/uL Final  . Hemoglobin 01/29/2021 9.2* 13.0 - 17.0 g/dL Final  . HCT 90/24/0973 34.7* 39.0 - 52.0 % Final  . MCV 01/29/2021 77.6* 80.0 - 100.0 fL Final  . MCH 01/29/2021 20.6* 26.0 - 34.0 pg Final  . MCHC 01/29/2021 26.5* 30.0 - 36.0 g/dL Final  . RDW 53/29/9242 26.9* 11.5 - 15.5 % Final  . Platelets 01/29/2021 218  150 - 400 K/uL Final  . nRBC 01/29/2021 0.2  0.0 - 0.2 % Final   Performed at [facility], [address] [place], [place] [address]    Assessment:  Anthony Fox is a 70 y.o. male with recurrent anemia secondary to iron deficiency and B12 deficiency.  He is on aspirin and Plavix.  Diet is modest.  He has been on oral iron for years.   CBC on 01/29/2021 revealed a ematocrit 34.7, hemoglobin   9.2, MCV 77.6, platelets 218,000, WBC  8,400.  Ferritin has been followed: 8 on 06/11/2013, 7.9 on 02/03/2020, 16 on 10/06/2020, and 4 on 01/25/2021  He has received 3 units of PRBCs (last on 01/25/2021).  He received Venofer x 1 (400 mg) on 01/26/2021.  Colonoscopy on 02/14/2019 revealed 2 polyps (adenomatous polyps).   He has B12 deficiency. B12 was 126 (low) on 10/06/2020.  He is on oral B12 1000 mcg a day.  He received B12 injections weekly (last  01/26/2021).  He was admitted in 05/2013 for severe iron deficiency anemia. Hemoglobin was 4.3. Upper and lower endoscopy on 06/13/2013 revealed no source of bleeding. Capsule endoscopy on 06/14/2013 revealed no bleeding source. His hemoglobin appropriately responded to transfusions.  He was admitted to [facility] from 01/24/2021 - 01/26/2021. He presented with near syncope and a hemoglobin of 5.1.  EGD on 01/25/2021 revealed benign-appearing esophageal stenoses. 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     Symptomatically, he feels better since he received his blood transfusions and IV iron. Exam is unremarkable.  Plan: 1.   Labs today:  CBC with diff, retic, B12, folate, intrinsic factor antibodies, anti-parietal cell antibodies. 2.   Urinalysis. 3.   Iron deficiency anemia  Patient has a h/o recurrent severe iron deficiency.  He has been on oral iron.  Encourage taking oral iron with orange juice or vitamin C.  Diet appears modest.  He denies any bleeding.  Anticipate follow-up with gastroenterology to ensure no bleeding source.   Consider repeat capsule study to r/o additional angioectasias.  Urinalysis to r/o hematuria.  Discuss additional Venofer if unable to replete iron stores.   Potential side effects reviewed.   Information provided.  Preauth Venofer. 4.   B12 deficiency  Diet is modest.  Continue oral B12.  Check level in 1 month.    B12 goal is 400.  Labs today to r/o pernicious anemia. 5.   RTC in 1 week for MD assessment, review of work-up and discussion regarding direction of therapy.  I discussed the assessment and treatment plan with  the patient.  The patient was provided an opportunity to ask questions and all were answered.  The patient agreed with the plan and demonstrated an understanding of the instructions.  The patient was advised to call back if the symptoms worsen or if the condition fails to improve as  anticipated.  I provided 24 minutes of face-to-face time during this this encounter and > 50% was spent counseling as documented under my assessment and plan.  An additional 15 minutes were spent reviewing his chart (Epic and Care Everywhere) including notes, labs, and imaging studies.    Claude Swendsen C. Merlene Pulling, MD, PhD    02/03/2021, 12:01 PM  I, Danella Penton Tufford, am acting as Neurosurgeon for [employer]. Merlene Pulling, MD, PhD.  I, Alex Leahy C. Merlene Pulling, MD, have reviewed the above documentation for accuracy and completeness, and I agree with the above.

## 2021-02-03 ENCOUNTER — Other Ambulatory Visit: Payer: Self-pay

## 2021-02-03 ENCOUNTER — Inpatient Hospital Stay: Payer: Medicare HMO | Attending: Hematology and Oncology | Admitting: Hematology and Oncology

## 2021-02-03 ENCOUNTER — Inpatient Hospital Stay: Payer: Medicare HMO

## 2021-02-03 ENCOUNTER — Encounter: Payer: Self-pay | Admitting: Hematology and Oncology

## 2021-02-03 VITALS — BP 123/59 | HR 66 | Temp 97.9°F | Resp 16 | Ht 75.0 in | Wt 221.0 lb

## 2021-02-03 DIAGNOSIS — E538 Deficiency of other specified B group vitamins: Secondary | ICD-10-CM | POA: Insufficient documentation

## 2021-02-03 DIAGNOSIS — K921 Melena: Secondary | ICD-10-CM | POA: Diagnosis not present

## 2021-02-03 DIAGNOSIS — Z7982 Long term (current) use of aspirin: Secondary | ICD-10-CM | POA: Insufficient documentation

## 2021-02-03 DIAGNOSIS — Z7902 Long term (current) use of antithrombotics/antiplatelets: Secondary | ICD-10-CM | POA: Diagnosis not present

## 2021-02-03 DIAGNOSIS — I4891 Unspecified atrial fibrillation: Secondary | ICD-10-CM | POA: Diagnosis not present

## 2021-02-03 DIAGNOSIS — I130 Hypertensive heart and chronic kidney disease with heart failure and stage 1 through stage 4 chronic kidney disease, or unspecified chronic kidney disease: Secondary | ICD-10-CM | POA: Diagnosis not present

## 2021-02-03 DIAGNOSIS — E1122 Type 2 diabetes mellitus with diabetic chronic kidney disease: Secondary | ICD-10-CM | POA: Diagnosis not present

## 2021-02-03 DIAGNOSIS — K31819 Angiodysplasia of stomach and duodenum without bleeding: Secondary | ICD-10-CM | POA: Diagnosis not present

## 2021-02-03 DIAGNOSIS — R55 Syncope and collapse: Secondary | ICD-10-CM | POA: Diagnosis not present

## 2021-02-03 DIAGNOSIS — N183 Chronic kidney disease, stage 3 unspecified: Secondary | ICD-10-CM | POA: Diagnosis not present

## 2021-02-03 DIAGNOSIS — Z833 Family history of diabetes mellitus: Secondary | ICD-10-CM | POA: Diagnosis not present

## 2021-02-03 DIAGNOSIS — D500 Iron deficiency anemia secondary to blood loss (chronic): Secondary | ICD-10-CM

## 2021-02-03 DIAGNOSIS — Z794 Long term (current) use of insulin: Secondary | ICD-10-CM | POA: Diagnosis not present

## 2021-02-03 DIAGNOSIS — I509 Heart failure, unspecified: Secondary | ICD-10-CM | POA: Insufficient documentation

## 2021-02-03 DIAGNOSIS — Z79899 Other long term (current) drug therapy: Secondary | ICD-10-CM | POA: Diagnosis not present

## 2021-02-03 DIAGNOSIS — Z8249 Family history of ischemic heart disease and other diseases of the circulatory system: Secondary | ICD-10-CM | POA: Insufficient documentation

## 2021-02-03 DIAGNOSIS — Z87891 Personal history of nicotine dependence: Secondary | ICD-10-CM | POA: Insufficient documentation

## 2021-02-03 DIAGNOSIS — D509 Iron deficiency anemia, unspecified: Secondary | ICD-10-CM | POA: Diagnosis not present

## 2021-02-03 LAB — URINALYSIS, COMPLETE (UACMP) WITH MICROSCOPIC
Bilirubin Urine: NEGATIVE
Glucose, UA: NEGATIVE mg/dL
Ketones, ur: NEGATIVE mg/dL
Nitrite: NEGATIVE
Protein, ur: 100 mg/dL — AB
Specific Gravity, Urine: 1.02 (ref 1.005–1.030)
Squamous Epithelial / HPF: NONE SEEN (ref 0–5)
WBC, UA: >50 WBC/hpf (ref 0–5)
pH: 5.5 (ref 5.0–8.0)

## 2021-02-03 LAB — CBC WITH DIFFERENTIAL/PLATELET
Abs Immature Granulocytes: 0.02 10*3/uL (ref 0.00–0.07)
Basophils Absolute: 0.1 10*3/uL (ref 0.0–0.1)
Basophils Relative: 1 %
Eosinophils Absolute: 0.6 10*3/uL — ABNORMAL HIGH (ref 0.0–0.5)
Eosinophils Relative: 11 %
HCT: 34.5 % — ABNORMAL LOW (ref 39.0–52.0)
Hemoglobin: 9.5 g/dL — ABNORMAL LOW (ref 13.0–17.0)
Immature Granulocytes: 0 %
Lymphocytes Relative: 12 %
Lymphs Abs: 0.6 10*3/uL — ABNORMAL LOW (ref 0.7–4.0)
MCH: 21.4 pg — ABNORMAL LOW (ref 26.0–34.0)
MCHC: 27.5 g/dL — ABNORMAL LOW (ref 30.0–36.0)
MCV: 77.7 fL — ABNORMAL LOW (ref 80.0–100.0)
Monocytes Absolute: 0.5 10*3/uL (ref 0.1–1.0)
Monocytes Relative: 10 %
Neutro Abs: 3.5 10*3/uL (ref 1.7–7.7)
Neutrophils Relative %: 66 %
Platelets: 182 10*3/uL (ref 150–400)
RBC: 4.44 MIL/uL (ref 4.22–5.81)
RDW: 26.5 % — ABNORMAL HIGH (ref 11.5–15.5)
WBC: 5.3 10*3/uL (ref 4.0–10.5)
nRBC: 0 % (ref 0.0–0.2)

## 2021-02-03 LAB — RETICULOCYTES
Immature Retic Fract: 44.8 % — ABNORMAL HIGH (ref 2.3–15.9)
RBC.: 4.37 MIL/uL (ref 4.22–5.81)
Retic Count, Absolute: 38.8 10*3/uL (ref 19.0–186.0)
Retic Ct Pct: 0.9 % (ref 0.4–3.1)

## 2021-02-03 LAB — FOLATE: Folate: 15.6 ng/mL (ref >5.9)

## 2021-02-03 NOTE — Patient Instructions (Signed)

## 2021-02-04 LAB — ANTI-PARIETAL ANTIBODY: Parietal Cell Antibody-IgG: 52.2 Units — ABNORMAL HIGH (ref 0.0–20.0)

## 2021-02-04 LAB — INTRINSIC FACTOR ANTIBODIES: Intrinsic Factor: 1.1 AU/mL (ref 0.0–1.1)

## 2021-02-08 DIAGNOSIS — H3589 Other specified retinal disorders: Secondary | ICD-10-CM | POA: Diagnosis not present

## 2021-02-08 DIAGNOSIS — Z794 Long term (current) use of insulin: Secondary | ICD-10-CM | POA: Diagnosis not present

## 2021-02-08 DIAGNOSIS — E113412 Type 2 diabetes mellitus with severe nonproliferative diabetic retinopathy with macular edema, left eye: Secondary | ICD-10-CM | POA: Diagnosis not present

## 2021-02-08 DIAGNOSIS — H3561 Retinal hemorrhage, right eye: Secondary | ICD-10-CM | POA: Diagnosis not present

## 2021-02-08 DIAGNOSIS — E113511 Type 2 diabetes mellitus with proliferative diabetic retinopathy with macular edema, right eye: Secondary | ICD-10-CM | POA: Diagnosis not present

## 2021-02-08 DIAGNOSIS — H43393 Other vitreous opacities, bilateral: Secondary | ICD-10-CM | POA: Diagnosis not present

## 2021-02-08 DIAGNOSIS — H2513 Age-related nuclear cataract, bilateral: Secondary | ICD-10-CM | POA: Diagnosis not present

## 2021-02-10 NOTE — Progress Notes (Signed)
[facility]  [address] [place], [place] [address] Phone: [phone]  Fax: [phone]   Clinic Day:  02/11/2021  Referring physician: Karrie Doffing, PA  Chief Complaint: Anthony Fox is a 70 y.o. male with iron deficiency anemia and B12 deficiency who is seen for review of work-up and discussion regarding direction of therapy.  HPI: The patient was last seen in the medical oncology clinic on 02/03/2021 for new patient assessment. At that time, he was seen for initial assessment after interval hospitalization for near syncope and a hemoglobin of 5.1.  He received 3 units of PRBCs and Venofer 400 mg IV while hospitalized.  EGD revealed a single angioectasia treated with APC.  He felt better since his blood transfusions and IV iron. Exam was unremarkable.  Work-up revealed a hematocrit of 34.5, hemoglobin 9.5, MCV 77.7, platelets 182,000, WBC 5,300. Reticulocyte count was 0.9%. Folate was 15.6. Parietal cell antibody-IgG was 52.2 (high). Intrinsic factor antibody was 1.1 (normal). UA showed a small amount of hemoglobin, large leukocytes, 0-5 RBC/HPF, and few bacteria.  Symptomatically, he has been "ok." He is a bit fatigued and feels "a little slow." He had a lot of energy when he first left the hospital, but now feels like his energy is decreasing. He denies dizziness and lightheadedness.  He eats a good breakfast - bacon, toast, oatmeal, and an egg. He stools are less dark now.  He denies blood in the stool and gross hematuria. He is still on oral iron once daily.  He takes lisinopril and metoprolol. He states his blood pressure is lower than usual today. He sometimes does not take his blood pressure medication at night because he doesn't feel like he needs it.   Past Medical History:  Diagnosis Date  . Anemia due to chronic kidney disease   . Arrhythmia    atrial fibrillation  . CHF (congestive heart failure) (HCC)   . CKD (chronic  kidney disease), stage III (HCC)   . Hyperlipidemia associated with type 2 diabetes mellitus (HCC)   . Hypertension associated with diabetes (HCC)   . Insulin dependent type 2 diabetes mellitus (HCC)   . PAD (peripheral artery disease) (HCC)     Past Surgical History:  Procedure Laterality Date  . ESOPHAGOGASTRODUODENOSCOPY (EGD) WITH PROPOFOL N/A 01/25/2021   Procedure: ESOPHAGOGASTRODUODENOSCOPY (EGD) WITH PROPOFOL;  Surgeon: Toledo, Boykin Nearing, MD;  Location: [facility] ENDOSCOPY;  Service: Gastroenterology;  Laterality: N/A;  melena, symptomatic anemia, hemoccult positive stool  . PERIPHERAL ARTERIAL STENT GRAFT Bilateral   . 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 Diabetes Mother   . Hypertension Mother   . Diabetes Brother     Social History:  reports that he has quit smoking. He quit after 1.00 year of use. He has never used smokeless tobacco. He reports that he does not drink alcohol and does not use drugs. He has not drank alcohol since 1995. He quit smoking about 10 years ago. He denies exposure to radiaiton or toxins. He is retired. The patient is accompanied by his sister today.  Allergies:  Allergies  Allergen Reactions  . Minocycline Rash    Current Medications: Current Outpatient Medications  Medication Sig Dispense Refill  . aspirin 81 MG chewable tablet Chew 81 mg by mouth daily.    Marland Kitchen atorvastatin (LIPITOR) 40 MG tablet Take 40 mg by mouth at bedtime.    . clopidogrel (PLAVIX) 75 MG tablet Take  75 mg by mouth daily.    . ferrous sulfate 325 (65 FE) MG tablet Take 325 mg by mouth daily with breakfast.    . gabapentin (NEURONTIN) 300 MG capsule Take 300 mg by mouth at bedtime.    . insulin NPH Human (NOVOLIN N) 100 UNIT/ML injection Inject 0.14 mLs (14 Units total) into the skin 2 (two) times daily before a meal. 10 mL 11  . lisinopril (ZESTRIL) 5 MG tablet Take 5 mg by mouth daily.    . metoprolol tartrate  (LOPRESSOR) 50 MG tablet Take 1 tablet (50 mg total) by mouth 2 (two) times daily. 60 tablet 3  . mupirocin ointment (BACTROBAN) 2 % Place 1 application into the nose 2 (two) times daily. 22 g 0  . pantoprazole (PROTONIX) 40 MG tablet Take 1 tablet (40 mg total) by mouth daily. 30 tablet 0  . sodium zirconium cyclosilicate (LOKELMA) 10 g PACK packet Take 10 g by mouth every other day.    . torsemide (DEMADEX) 20 MG tablet Take 1 tablet (20 mg total) by mouth daily. 30 tablet 5  . vitamin B-12 (CYANOCOBALAMIN) 1000 MCG tablet Take 1 tablet (1,000 mcg total) by mouth daily. 30 tablet 0   No current facility-administered medications for this visit.    Review of Systems  Constitutional: Positive for malaise/fatigue. Negative for chills, diaphoresis, fever and weight loss.       Feels "ok." Feels "a little slow."  HENT: Negative for congestion, ear discharge, ear pain, hearing loss, nosebleeds, sinus pain, sore throat and tinnitus.   Eyes: Negative for blurred vision.  Respiratory: Negative for cough, hemoptysis, sputum production and shortness of breath.   Cardiovascular: Negative for chest pain, palpitations and leg swelling.  Gastrointestinal: Positive for melena (less dark, on oral iron). Negative for abdominal pain, blood in stool, constipation, diarrhea, heartburn, nausea and vomiting.       Good appetite.  Genitourinary: Negative for dysuria, frequency, hematuria and urgency.  Musculoskeletal: Negative for back pain, joint pain, myalgias and neck pain.  Skin: Negative for itching and rash.  Neurological: Negative for dizziness, tingling, sensory change, weakness and headaches.  Endo/Heme/Allergies: Does not bruise/bleed easily.  Psychiatric/Behavioral: Negative for depression and memory loss. The patient is not nervous/anxious and does not have insomnia.   All other systems reviewed and are negative.  Performance status (ECOG): 1-2  Vitals Blood pressure (!) 149/46, pulse (!) 56,  temperature (!) 96.7 F (35.9 C), resp. rate 18, weight 219 lb (99.3 kg), SpO2 100 %.   Physical Exam Vitals and nursing note reviewed.  Constitutional:      General: He is not in acute distress.    Appearance: He is not diaphoretic.     Comments: Patient sitting comfortably in wheelchair in no acute distress.   HENT:     Head: Normocephalic and atraumatic.  Eyes:     General: No scleral icterus.    Conjunctiva/sclera: Conjunctivae normal.     Comments: Brown eyes.  Neurological:     Mental Status: He is alert and oriented to person, place, and time.  Psychiatric:        Behavior: Behavior normal.        Thought Content: Thought content normal.        Judgment: Judgment normal.    No visits with results within 3 Day(s) from this visit.  Latest known visit with results is:  Appointment on 02/03/2021  Component Date Value Ref Range Status  . Parietal Cell Antibody-IgG 02/03/2021 52.2*  0.0 - 20.0 Units Final   Comment: (NOTE)                                Negative    0.0 - 20.0                                Equivocal  20.1 - 24.9                                Positive         >24.9 Parietal Cell Antibodies are found in 90% of patients with pernicious anemia and 30% of first degree relatives with pernicious anemia. Performed At: [facility] [address] [place], [place] [id number] Jolene Schimke MD TJ:[id number]   . Intrinsic Factor 02/03/2021 1.1  0.0 - 1.1 AU/mL Final   Comment: (NOTE) Performed At: [facility] [address] [place], [place] [id number] Jolene Schimke MD KT:[id number]   . Folate 02/03/2021 15.6  >5.9 ng/mL Final   Performed at [facility], [address] [place] [place], [place] [address]  . Retic Ct Pct 02/03/2021 0.9  0.4 - 3.1 % Final  . RBC. 02/03/2021 4.37  4.22 - 5.81 MIL/uL Final  . Retic Count, Absolute 02/03/2021 38.8  19.0 - 186.0 K/uL Final  . Immature Retic Fract 02/03/2021 44.8* 2.3 - 15.9 % Final   Performed at  [facility], [address] [place], [place] [address]  . WBC 02/03/2021 5.3  4.0 - 10.5 K/uL Final  . RBC 02/03/2021 4.44  4.22 - 5.81 MIL/uL Final  . Hemoglobin 02/03/2021 9.5* 13.0 - 17.0 g/dL Final  . HCT 26/20/3559 34.5* 39.0 - 52.0 % Final  . MCV 02/03/2021 77.7* 80.0 - 100.0 fL Final  . MCH 02/03/2021 21.4* 26.0 - 34.0 pg Final  . MCHC 02/03/2021 27.5* 30.0 - 36.0 g/dL Final  . RDW 74/16/3845 26.5* 11.5 - 15.5 % Final  . Platelets 02/03/2021 182  150 - 400 K/uL Final  . nRBC 02/03/2021 0.0  0.0 - 0.2 % Final  . Neutrophils Relative % 02/03/2021 66  % Final  . Neutro Abs 02/03/2021 3.5  1.7 - 7.7 K/uL Final  . Lymphocytes Relative 02/03/2021 12  % Final  . Lymphs Abs 02/03/2021 0.6* 0.7 - 4.0 K/uL Final  . Monocytes Relative 02/03/2021 10  % Final  . Monocytes Absolute 02/03/2021 0.5  0.1 - 1.0 K/uL Final  . Eosinophils Relative 02/03/2021 11  % Final  . Eosinophils Absolute 02/03/2021 0.6* 0.0 - 0.5 K/uL Final  . Basophils Relative 02/03/2021 1  % Final  . Basophils Absolute 02/03/2021 0.1  0.0 - 0.1 K/uL Final  . Smear Review 02/03/2021 PLATELET COUNT CONFIRMED BY SMEAR   Final  . Immature Granulocytes 02/03/2021 0  % Final  . Abs Immature Granulocytes 02/03/2021 0.02  0.00 - 0.07 K/uL Final  . Acanthocytes 02/03/2021 PRESENT   Final  . Ovalocytes 02/03/2021 PRESENT   Final   Performed at [facility], [address] [place], [place] [address]  . Color, Urine 02/03/2021 YELLOW  YELLOW Final  . APPearance 02/03/2021 CLOUDY* CLEAR Final  . Specific Gravity, Urine 02/03/2021 1.020  1.005 - 1.030 Final  . pH 02/03/2021 5.5  5.0 - 8.0 Final  . Glucose, UA 02/03/2021 NEGATIVE  NEGATIVE  mg/dL Final  . Hgb urine dipstick 02/03/2021 SMALL* NEGATIVE Final  . Bilirubin Urine 02/03/2021 NEGATIVE  NEGATIVE Final  . Ketones, ur 02/03/2021 NEGATIVE  NEGATIVE mg/dL Final  . Protein, ur 41/66/0630 100* NEGATIVE mg/dL Final  . Nitrite 16/10/930 NEGATIVE  NEGATIVE  Final  . Glori Luis 02/03/2021 LARGE* NEGATIVE Final  . Squamous Epithelial / LPF 02/03/2021 NONE SEEN  0 - 5 Final  . WBC, UA 02/03/2021 >50  0 - 5 WBC/hpf Final  . RBC / HPF 02/03/2021 0-5  0 - 5 RBC/hpf Final  . Bacteria, UA 02/03/2021 FEW* NONE SEEN Final   Performed at [facility], [address] [place], [place] [address]    Assessment:  Keaden Gunnoe is a 70 y.o. male with recurrent anemia secondary to iron deficiency and B12 deficiency.  He is on aspirin and Plavix.  Diet is modest.  He has been on oral iron for years.   CBC on 01/29/2021 revealed a hematocrit 34.7, hemoglobin   9.2, MCV 77.6, platelets 218,000, WBC  8,400.  Work-up on 02/03/2021 revealed a hematocrit of 34.5, hemoglobin 9.5, MCV 77.7, platelets 182,000, WBC 5,300. Reticulocyte count was 0.9%. Folate was 15.6. Parietal cell antibody-IgG was 52.2 (high). Intrinsic factor antibody was 1.1 (normal).  Urinalysis showed a small amount of hemoglobin, large leukocytes, 0-5 RBC/HPF, and few bacteria.  Ferritin has been followed: 8 on 06/11/2013, 7.9 on 02/03/2020, 16 on 10/06/2020, and 4 on 01/25/2021  He has received 3 units of PRBCs (last on 01/25/2021).  He received Venofer x 1 (400 mg) on 01/26/2021.  Colonoscopy on 02/14/2019 revealed 2 polyps (adenomatous polyps).   He has B12 deficiency. B12 was 126 (low) on 10/06/2020.  He has pernicious anemia.  Parietal cell antibody was 52.2 (high) and intrinsic factor antibody 1.1 (normal).  He is on oral B12 1000 mcg a day.  He receives B12 injections weekly (last 01/26/2021).  He was admitted in 05/2013 for severe iron deficiency anemia. Hemoglobin was 4.3. Upper and lower endoscopy on 06/13/2013 revealed no source of bleeding. Capsule endoscopy on 06/14/2013 revealed no bleeding source. His hemoglobin appropriately responded to transfusions.  He was admitted to [facility] from 01/24/2021 - 01/26/2021. He presented with near syncope and a hemoglobin of 5.1.  EGD on  01/25/2021 revealed benign-appearing esophageal stenoses. 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     He has chronic renal insufficiency. Creatinine was 2.25 (CrCl 37 ml/min) on 01/29/2021.  Symptomatically, he feels a bit fatigued. He denies dizziness and lightheadedness.  He denies blood in the stool and gross hematuria.  Diet is good.  He is on oral iron once daily.  Plan: 1.   Review work-up. 2.   Iron deficiency anemia  Patient has a h/o recurrent severe iron deficiency.   Diet is good.  He denies any bleeding.  Urinalysis revealed a small amount of hemoglobin, large leukocytes, 0-5 RBC/HPF.   Discuss plan to recheck UA and referral to urology if hematuria persists.  Discuss plan to follow-up with gastroenterology to ensure no bleeding source.   Consider repeat capsule study to r/o additional angioectasias.   Schedule follow-up with Dr Norma Fredrickson.  He remains on oral iron.   Discuss increasing oral iron to BID.  Ferritin goal 100.  If unable to replete iron stores, discuss IV iron.  Several questions asked and answered. 3.   B12 deficiency  B12 was 126 (low) on 10/06/2020.    He has pernicious anemia.  Diagnosis reviewed.  Continue oral B12.  Check level in 1 month.    B12 goal is 400. 4.   Please schedule follow-up with Dr Norma Fredricksonoledo (patient known to him). 5.   RTC in 1 week for labs (CBC, ferritin, iron studies- day before) and +/- Venofer and +/- B12.  I discussed the assessment and treatment plan with the patient.  The patient was provided an opportunity to ask questions and all were answered.  The patient agreed with the plan and demonstrated an understanding of the instructions.  The patient was advised to call back if the symptoms worsen or if the condition fails to improve as anticipated.  I provided 27 minutes of face-to-face time during this this encounter and > 50% was spent counseling as documented under my assessment  and plan. An additional 5 minutes were spent reviewing his chart (Epic and Care Everywhere) including notes, labs, and imaging studies.    Anthony Kegel C. Merlene Pullingorcoran, MD, PhD    02/11/2021, 12:22 PM  I, Anthony Fox, am acting as Neurosurgeonscribe for General MotorsMelissa C. Merlene Pullingorcoran, MD, PhD.  I, Anthony Probert C. Merlene Pullingorcoran, MD, have reviewed the above documentation for accuracy and completeness, and I agree with the above.

## 2021-02-11 ENCOUNTER — Other Ambulatory Visit: Payer: Self-pay

## 2021-02-11 ENCOUNTER — Inpatient Hospital Stay (HOSPITAL_BASED_OUTPATIENT_CLINIC_OR_DEPARTMENT_OTHER): Payer: Medicare HMO | Admitting: Hematology and Oncology

## 2021-02-11 ENCOUNTER — Encounter: Payer: Self-pay | Admitting: Hematology and Oncology

## 2021-02-11 ENCOUNTER — Other Ambulatory Visit: Payer: Self-pay | Admitting: Hematology and Oncology

## 2021-02-11 VITALS — BP 149/46 | HR 56 | Temp 96.7°F | Resp 18 | Wt 219.0 lb

## 2021-02-11 DIAGNOSIS — D509 Iron deficiency anemia, unspecified: Secondary | ICD-10-CM | POA: Diagnosis not present

## 2021-02-11 DIAGNOSIS — E1122 Type 2 diabetes mellitus with diabetic chronic kidney disease: Secondary | ICD-10-CM | POA: Diagnosis not present

## 2021-02-11 DIAGNOSIS — R55 Syncope and collapse: Secondary | ICD-10-CM | POA: Diagnosis not present

## 2021-02-11 DIAGNOSIS — D510 Vitamin B12 deficiency anemia due to intrinsic factor deficiency: Secondary | ICD-10-CM

## 2021-02-11 DIAGNOSIS — E538 Deficiency of other specified B group vitamins: Secondary | ICD-10-CM | POA: Diagnosis not present

## 2021-02-11 DIAGNOSIS — D500 Iron deficiency anemia secondary to blood loss (chronic): Secondary | ICD-10-CM | POA: Diagnosis not present

## 2021-02-11 DIAGNOSIS — N183 Chronic kidney disease, stage 3 unspecified: Secondary | ICD-10-CM | POA: Diagnosis not present

## 2021-02-11 DIAGNOSIS — I130 Hypertensive heart and chronic kidney disease with heart failure and stage 1 through stage 4 chronic kidney disease, or unspecified chronic kidney disease: Secondary | ICD-10-CM | POA: Diagnosis not present

## 2021-02-11 DIAGNOSIS — I4891 Unspecified atrial fibrillation: Secondary | ICD-10-CM | POA: Diagnosis not present

## 2021-02-11 DIAGNOSIS — K31819 Angiodysplasia of stomach and duodenum without bleeding: Secondary | ICD-10-CM | POA: Diagnosis not present

## 2021-02-11 DIAGNOSIS — K921 Melena: Secondary | ICD-10-CM | POA: Diagnosis not present

## 2021-02-11 DIAGNOSIS — I509 Heart failure, unspecified: Secondary | ICD-10-CM | POA: Diagnosis not present

## 2021-02-11 NOTE — Patient Instructions (Signed)
  Try oral iron pill twice a day with vitamin C.

## 2021-02-11 NOTE — Progress Notes (Signed)
Patient denies new problems/concerns today.  Blood pressure is 145/39, 148/46, 149/46 today and got 141/67 at home prior to taking BP medications.  Heart rate is also irregular today with range 40s-60s, reports history of irregular HR.

## 2021-02-16 NOTE — Telephone Encounter (Signed)
Zobro, Anthony Peru, RN This patient called to cancel his consult with Dr. Excell Seltzer. He states a team in [place] will be doing his procedure instead.     Received above message 02/12/21. The patient's Watchman consult was cancelled by Scheduling per patient request.

## 2021-02-17 ENCOUNTER — Other Ambulatory Visit: Payer: Self-pay

## 2021-02-17 DIAGNOSIS — D500 Iron deficiency anemia secondary to blood loss (chronic): Secondary | ICD-10-CM

## 2021-02-17 DIAGNOSIS — D510 Vitamin B12 deficiency anemia due to intrinsic factor deficiency: Secondary | ICD-10-CM

## 2021-02-17 DIAGNOSIS — E538 Deficiency of other specified B group vitamins: Secondary | ICD-10-CM

## 2021-02-22 ENCOUNTER — Other Ambulatory Visit: Payer: Self-pay

## 2021-02-22 ENCOUNTER — Inpatient Hospital Stay: Payer: Medicare HMO | Attending: Oncology

## 2021-02-22 ENCOUNTER — Other Ambulatory Visit: Payer: Medicare HMO

## 2021-02-22 ENCOUNTER — Ambulatory Visit: Payer: Medicare HMO

## 2021-02-22 DIAGNOSIS — Z79899 Other long term (current) drug therapy: Secondary | ICD-10-CM | POA: Diagnosis not present

## 2021-02-22 DIAGNOSIS — D510 Vitamin B12 deficiency anemia due to intrinsic factor deficiency: Secondary | ICD-10-CM

## 2021-02-22 DIAGNOSIS — D509 Iron deficiency anemia, unspecified: Secondary | ICD-10-CM | POA: Diagnosis not present

## 2021-02-22 DIAGNOSIS — E538 Deficiency of other specified B group vitamins: Secondary | ICD-10-CM | POA: Insufficient documentation

## 2021-02-22 DIAGNOSIS — D500 Iron deficiency anemia secondary to blood loss (chronic): Secondary | ICD-10-CM

## 2021-02-22 LAB — CBC WITH DIFFERENTIAL/PLATELET
Abs Immature Granulocytes: 0.02 10*3/uL (ref 0.00–0.07)
Basophils Absolute: 0.1 10*3/uL (ref 0.0–0.1)
Basophils Relative: 1 %
Eosinophils Absolute: 0.8 10*3/uL — ABNORMAL HIGH (ref 0.0–0.5)
Eosinophils Relative: 11 %
HCT: 40.3 % (ref 39.0–52.0)
Hemoglobin: 11.1 g/dL — ABNORMAL LOW (ref 13.0–17.0)
Immature Granulocytes: 0 %
Lymphocytes Relative: 14 %
Lymphs Abs: 1 10*3/uL (ref 0.7–4.0)
MCH: 22.1 pg — ABNORMAL LOW (ref 26.0–34.0)
MCHC: 27.5 g/dL — ABNORMAL LOW (ref 30.0–36.0)
MCV: 80.1 fL (ref 80.0–100.0)
Monocytes Absolute: 0.6 10*3/uL (ref 0.1–1.0)
Monocytes Relative: 8 %
Neutro Abs: 4.9 10*3/uL (ref 1.7–7.7)
Neutrophils Relative %: 66 %
Platelets: 192 10*3/uL (ref 150–400)
RBC: 5.03 MIL/uL (ref 4.22–5.81)
RDW: 24 % — ABNORMAL HIGH (ref 11.5–15.5)
WBC: 7.3 10*3/uL (ref 4.0–10.5)
nRBC: 0 % (ref 0.0–0.2)

## 2021-02-22 LAB — IRON AND TIBC
Iron: 31 ug/dL — ABNORMAL LOW (ref 45–182)
Saturation Ratios: 9 % — ABNORMAL LOW (ref 17.9–39.5)
TIBC: 367 ug/dL (ref 250–450)
UIBC: 336 ug/dL

## 2021-02-22 LAB — FERRITIN: Ferritin: 13 ng/mL — ABNORMAL LOW (ref 24–336)

## 2021-02-23 ENCOUNTER — Inpatient Hospital Stay: Payer: Medicare HMO

## 2021-02-23 VITALS — BP 136/78 | HR 65 | Resp 18

## 2021-02-23 DIAGNOSIS — D509 Iron deficiency anemia, unspecified: Secondary | ICD-10-CM | POA: Diagnosis not present

## 2021-02-23 DIAGNOSIS — D500 Iron deficiency anemia secondary to blood loss (chronic): Secondary | ICD-10-CM

## 2021-02-23 DIAGNOSIS — E538 Deficiency of other specified B group vitamins: Secondary | ICD-10-CM | POA: Diagnosis not present

## 2021-02-23 DIAGNOSIS — Z79899 Other long term (current) drug therapy: Secondary | ICD-10-CM | POA: Diagnosis not present

## 2021-02-23 MED ORDER — SODIUM CHLORIDE 0.9 % IV SOLN
Freq: Once | INTRAVENOUS | Status: AC
Start: 2021-02-23 — End: 2021-02-23
  Filled 2021-02-23: qty 250

## 2021-02-23 MED ORDER — SODIUM CHLORIDE 0.9 % IV SOLN: 200.0000 mg | Freq: Once | INTRAVENOUS | Status: DC

## 2021-02-23 MED ORDER — IRON SUCROSE 20 MG/ML IV SOLN
200.0000 mg | Freq: Once | INTRAVENOUS | Status: AC
  Administered 2021-02-23: 200 mg via INTRAVENOUS
  Filled 2021-02-23: qty 10

## 2021-02-23 MED ORDER — CYANOCOBALAMIN 1000 MCG/ML IJ SOLN
1000.0000 ug | Freq: Once | INTRAMUSCULAR | Status: AC
  Administered 2021-02-23: 1000 ug via INTRAMUSCULAR
  Filled 2021-02-23: qty 1

## 2021-02-25 ENCOUNTER — Telehealth: Payer: Self-pay | Admitting: Oncology

## 2021-02-25 NOTE — Telephone Encounter (Signed)
Pt's sister called and stated that he needed to schedule more infusion appts. I didn't see any notes saying anything regarding scheduling anything. Could you advise me on if I need to schedule him anymore appts?

## 2021-02-26 ENCOUNTER — Institutional Professional Consult (permissible substitution): Payer: Medicare HMO | Admitting: Cardiovascular Disease

## 2021-02-26 NOTE — Telephone Encounter (Signed)
He needs more then 1 dose of Iron I think. It looks like he has one dose on 02/23/21. He needs probably 3 more doses and then follow-up in 3 months. Thanks.   Durenda Hurt, NP 02/26/2021 12:35 PM

## 2021-03-02 ENCOUNTER — Inpatient Hospital Stay: Payer: Medicare HMO

## 2021-03-02 ENCOUNTER — Other Ambulatory Visit: Payer: Self-pay

## 2021-03-02 VITALS — BP 165/77 | HR 56 | Temp 96.5°F | Resp 18

## 2021-03-02 DIAGNOSIS — E538 Deficiency of other specified B group vitamins: Secondary | ICD-10-CM | POA: Diagnosis not present

## 2021-03-02 DIAGNOSIS — D500 Iron deficiency anemia secondary to blood loss (chronic): Secondary | ICD-10-CM

## 2021-03-02 DIAGNOSIS — Z79899 Other long term (current) drug therapy: Secondary | ICD-10-CM | POA: Diagnosis not present

## 2021-03-02 DIAGNOSIS — D509 Iron deficiency anemia, unspecified: Secondary | ICD-10-CM | POA: Diagnosis not present

## 2021-03-02 MED ORDER — IRON SUCROSE 20 MG/ML IV SOLN
200.0000 mg | Freq: Once | INTRAVENOUS | Status: AC
  Administered 2021-03-02: 200 mg via INTRAVENOUS
  Filled 2021-03-02: qty 10

## 2021-03-02 MED ORDER — SODIUM CHLORIDE 0.9 % IV SOLN: 200.0000 mg | Freq: Once | INTRAVENOUS | Status: DC

## 2021-03-02 MED ORDER — SODIUM CHLORIDE 0.9 % IV SOLN
Freq: Once | INTRAVENOUS | Status: AC
  Filled 2021-03-02: qty 250

## 2021-03-02 MED ORDER — CYANOCOBALAMIN 1000 MCG/ML IJ SOLN
1000.0000 ug | Freq: Once | INTRAMUSCULAR | Status: AC
  Administered 2021-03-02: 1000 ug via INTRAMUSCULAR
  Filled 2021-03-02: qty 1

## 2021-03-04 DIAGNOSIS — Z87891 Personal history of nicotine dependence: Secondary | ICD-10-CM | POA: Diagnosis not present

## 2021-03-04 DIAGNOSIS — I7025 Atherosclerosis of native arteries of other extremities with ulceration: Secondary | ICD-10-CM | POA: Diagnosis not present

## 2021-03-04 DIAGNOSIS — R001 Bradycardia, unspecified: Secondary | ICD-10-CM | POA: Diagnosis not present

## 2021-03-04 DIAGNOSIS — Q208 Other congenital malformations of cardiac chambers and connections: Secondary | ICD-10-CM | POA: Diagnosis not present

## 2021-03-04 DIAGNOSIS — Z7982 Long term (current) use of aspirin: Secondary | ICD-10-CM | POA: Diagnosis not present

## 2021-03-04 DIAGNOSIS — E119 Type 2 diabetes mellitus without complications: Secondary | ICD-10-CM | POA: Diagnosis not present

## 2021-03-04 DIAGNOSIS — I10 Essential (primary) hypertension: Secondary | ICD-10-CM | POA: Diagnosis not present

## 2021-03-09 ENCOUNTER — Other Ambulatory Visit: Payer: Self-pay

## 2021-03-09 ENCOUNTER — Inpatient Hospital Stay: Payer: Medicare HMO

## 2021-03-09 VITALS — BP 149/80 | HR 62 | Temp 96.8°F | Resp 18

## 2021-03-09 DIAGNOSIS — D509 Iron deficiency anemia, unspecified: Secondary | ICD-10-CM | POA: Diagnosis not present

## 2021-03-09 DIAGNOSIS — D500 Iron deficiency anemia secondary to blood loss (chronic): Secondary | ICD-10-CM

## 2021-03-09 DIAGNOSIS — Z79899 Other long term (current) drug therapy: Secondary | ICD-10-CM | POA: Diagnosis not present

## 2021-03-09 DIAGNOSIS — E538 Deficiency of other specified B group vitamins: Secondary | ICD-10-CM | POA: Diagnosis not present

## 2021-03-09 MED ORDER — IRON SUCROSE 20 MG/ML IV SOLN
200.0000 mg | Freq: Once | INTRAVENOUS | Status: AC
Start: 2021-03-09 — End: 2021-03-09
  Administered 2021-03-09: 200 mg via INTRAVENOUS
  Filled 2021-03-09: qty 10

## 2021-03-09 MED ORDER — SODIUM CHLORIDE 0.9 % IV SOLN
Freq: Once | INTRAVENOUS | Status: AC
  Filled 2021-03-09: qty 250

## 2021-03-09 MED ORDER — CYANOCOBALAMIN 1000 MCG/ML IJ SOLN
1000.0000 ug | Freq: Once | INTRAMUSCULAR | Status: AC
  Administered 2021-03-09: 1000 ug via INTRAMUSCULAR
  Filled 2021-03-09: qty 1

## 2021-03-09 MED ORDER — SODIUM CHLORIDE 0.9 % IV SOLN: 200.0000 mg | Freq: Once | INTRAVENOUS | Status: DC

## 2021-03-12 DIAGNOSIS — I10 Essential (primary) hypertension: Secondary | ICD-10-CM | POA: Diagnosis not present

## 2021-03-12 DIAGNOSIS — E1165 Type 2 diabetes mellitus with hyperglycemia: Secondary | ICD-10-CM | POA: Diagnosis not present

## 2021-03-12 DIAGNOSIS — D509 Iron deficiency anemia, unspecified: Secondary | ICD-10-CM | POA: Diagnosis not present

## 2021-03-12 DIAGNOSIS — N1832 Chronic kidney disease, stage 3b: Secondary | ICD-10-CM | POA: Diagnosis not present

## 2021-03-16 ENCOUNTER — Other Ambulatory Visit: Payer: Self-pay

## 2021-03-16 ENCOUNTER — Inpatient Hospital Stay: Payer: Medicare HMO

## 2021-03-16 VITALS — BP 150/80 | HR 48 | Temp 97.5°F | Resp 18

## 2021-03-16 DIAGNOSIS — E538 Deficiency of other specified B group vitamins: Secondary | ICD-10-CM | POA: Diagnosis not present

## 2021-03-16 DIAGNOSIS — D500 Iron deficiency anemia secondary to blood loss (chronic): Secondary | ICD-10-CM

## 2021-03-16 DIAGNOSIS — D509 Iron deficiency anemia, unspecified: Secondary | ICD-10-CM | POA: Diagnosis not present

## 2021-03-16 DIAGNOSIS — Z79899 Other long term (current) drug therapy: Secondary | ICD-10-CM | POA: Diagnosis not present

## 2021-03-16 MED ORDER — SODIUM CHLORIDE 0.9 % IV SOLN
Freq: Once | INTRAVENOUS | Status: AC
  Filled 2021-03-16: qty 250

## 2021-03-16 MED ORDER — SODIUM CHLORIDE 0.9 % IV SOLN: 200.0000 mg | Freq: Once | INTRAVENOUS | Status: DC

## 2021-03-16 MED ORDER — IRON SUCROSE 20 MG/ML IV SOLN
200.0000 mg | Freq: Once | INTRAVENOUS | Status: AC
Start: 2021-03-16 — End: 2021-03-16
  Administered 2021-03-16: 200 mg via INTRAVENOUS
  Filled 2021-03-16: qty 10

## 2021-03-17 DIAGNOSIS — E875 Hyperkalemia: Secondary | ICD-10-CM | POA: Diagnosis not present

## 2021-03-17 DIAGNOSIS — D509 Iron deficiency anemia, unspecified: Secondary | ICD-10-CM | POA: Diagnosis not present

## 2021-03-17 DIAGNOSIS — N1832 Chronic kidney disease, stage 3b: Secondary | ICD-10-CM | POA: Diagnosis not present

## 2021-03-17 DIAGNOSIS — E1165 Type 2 diabetes mellitus with hyperglycemia: Secondary | ICD-10-CM | POA: Diagnosis not present

## 2021-03-17 DIAGNOSIS — I10 Essential (primary) hypertension: Secondary | ICD-10-CM | POA: Diagnosis not present

## 2021-04-01 ENCOUNTER — Ambulatory Visit: Payer: Medicare HMO | Admitting: Cardiology

## 2021-04-09 DIAGNOSIS — I2584 Coronary atherosclerosis due to calcified coronary lesion: Secondary | ICD-10-CM | POA: Diagnosis not present

## 2021-04-09 DIAGNOSIS — I480 Paroxysmal atrial fibrillation: Secondary | ICD-10-CM | POA: Diagnosis not present

## 2021-04-21 DIAGNOSIS — E1151 Type 2 diabetes mellitus with diabetic peripheral angiopathy without gangrene: Secondary | ICD-10-CM | POA: Diagnosis not present

## 2021-04-21 DIAGNOSIS — E785 Hyperlipidemia, unspecified: Secondary | ICD-10-CM | POA: Diagnosis not present

## 2021-04-21 DIAGNOSIS — R001 Bradycardia, unspecified: Secondary | ICD-10-CM | POA: Diagnosis not present

## 2021-04-21 DIAGNOSIS — I480 Paroxysmal atrial fibrillation: Secondary | ICD-10-CM | POA: Diagnosis not present

## 2021-04-21 DIAGNOSIS — I4891 Unspecified atrial fibrillation: Secondary | ICD-10-CM | POA: Diagnosis not present

## 2021-04-21 DIAGNOSIS — I129 Hypertensive chronic kidney disease with stage 1 through stage 4 chronic kidney disease, or unspecified chronic kidney disease: Secondary | ICD-10-CM | POA: Diagnosis not present

## 2021-04-21 DIAGNOSIS — I2510 Atherosclerotic heart disease of native coronary artery without angina pectoris: Secondary | ICD-10-CM | POA: Diagnosis not present

## 2021-04-21 DIAGNOSIS — Z006 Encounter for examination for normal comparison and control in clinical research program: Secondary | ICD-10-CM | POA: Diagnosis not present

## 2021-04-21 DIAGNOSIS — N183 Chronic kidney disease, stage 3 unspecified: Secondary | ICD-10-CM | POA: Diagnosis not present

## 2021-04-21 DIAGNOSIS — E1122 Type 2 diabetes mellitus with diabetic chronic kidney disease: Secondary | ICD-10-CM | POA: Diagnosis not present

## 2021-05-05 DIAGNOSIS — E118 Type 2 diabetes mellitus with unspecified complications: Secondary | ICD-10-CM | POA: Diagnosis not present

## 2021-05-05 DIAGNOSIS — Z794 Long term (current) use of insulin: Secondary | ICD-10-CM | POA: Diagnosis not present

## 2021-05-10 DIAGNOSIS — H43393 Other vitreous opacities, bilateral: Secondary | ICD-10-CM | POA: Diagnosis not present

## 2021-05-10 DIAGNOSIS — E113492 Type 2 diabetes mellitus with severe nonproliferative diabetic retinopathy without macular edema, left eye: Secondary | ICD-10-CM | POA: Diagnosis not present

## 2021-05-10 DIAGNOSIS — E113591 Type 2 diabetes mellitus with proliferative diabetic retinopathy without macular edema, right eye: Secondary | ICD-10-CM | POA: Diagnosis not present

## 2021-05-10 DIAGNOSIS — H2513 Age-related nuclear cataract, bilateral: Secondary | ICD-10-CM | POA: Diagnosis not present

## 2021-05-25 ENCOUNTER — Ambulatory Visit: Payer: Medicare HMO | Admitting: Nurse Practitioner

## 2021-05-27 DIAGNOSIS — I480 Paroxysmal atrial fibrillation: Secondary | ICD-10-CM | POA: Diagnosis not present

## 2021-05-31 ENCOUNTER — Ambulatory Visit: Payer: Medicare HMO | Admitting: Nurse Practitioner

## 2021-06-02 ENCOUNTER — Other Ambulatory Visit: Payer: Self-pay

## 2021-06-02 ENCOUNTER — Inpatient Hospital Stay: Payer: Medicare HMO | Attending: Oncology | Admitting: Oncology

## 2021-06-02 VITALS — BP 161/68 | HR 50 | Temp 97.1°F | Resp 18 | Wt 218.7 lb

## 2021-06-02 DIAGNOSIS — I4891 Unspecified atrial fibrillation: Secondary | ICD-10-CM | POA: Insufficient documentation

## 2021-06-02 DIAGNOSIS — Z79899 Other long term (current) drug therapy: Secondary | ICD-10-CM | POA: Diagnosis not present

## 2021-06-02 DIAGNOSIS — E538 Deficiency of other specified B group vitamins: Secondary | ICD-10-CM | POA: Insufficient documentation

## 2021-06-02 DIAGNOSIS — I739 Peripheral vascular disease, unspecified: Secondary | ICD-10-CM | POA: Insufficient documentation

## 2021-06-02 DIAGNOSIS — Z8249 Family history of ischemic heart disease and other diseases of the circulatory system: Secondary | ICD-10-CM | POA: Insufficient documentation

## 2021-06-02 DIAGNOSIS — I509 Heart failure, unspecified: Secondary | ICD-10-CM | POA: Insufficient documentation

## 2021-06-02 DIAGNOSIS — D509 Iron deficiency anemia, unspecified: Secondary | ICD-10-CM | POA: Insufficient documentation

## 2021-06-02 DIAGNOSIS — Z87891 Personal history of nicotine dependence: Secondary | ICD-10-CM | POA: Insufficient documentation

## 2021-06-02 DIAGNOSIS — Z833 Family history of diabetes mellitus: Secondary | ICD-10-CM | POA: Diagnosis not present

## 2021-06-02 DIAGNOSIS — Z7901 Long term (current) use of anticoagulants: Secondary | ICD-10-CM | POA: Insufficient documentation

## 2021-06-02 NOTE — Progress Notes (Signed)
Hematology/Oncology Consult note [facility]  [phone] [phone]  Patient Care Team: Samara Snide, PA as PCP - General (Internal Medicine) Kate Sable, MD as PCP - Cardiology (Cardiology) Lequita Asal, MD (Inactive) as Referring Physician (Hematology and Oncology) Tawni Levy, NP as Nurse Practitioner (Family Medicine) Efrain Sella, MD as Consulting Physician (Gastroenterology)   Name of the patient: Anthony Fox  [id number]  [birth year]   Date of visit: 06/02/21  Diagnosis-iron and B12 deficiency anemia  Chief complaint/ Reason for visit-routine follow-up of anemia   Heme/Onc history: Patient is a 70 year old male with history of iron and B12 deficiency anemia being followed by Dr. Mike Gip in the past.Upper and lower endoscopy on 06/13/2013 revealed no source of bleeding. Capsule endoscopy on 06/14/2013 revealed no bleeding source. His hemoglobin appropriately responded to transfusions.He was admitted to [facility] from 01/24/2021 - 01/26/2021. He presented with near syncope and a hemoglobin of 5.1.  EGD on 01/25/2021 revealed benign-appearing esophageal stenoses. 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     He has received IV iron in the past and is on oral B12 for B12 deficiency.  Pernicious anemia work-up in the past was negative   Interval history-patient reports doing well and denies any specific complaints at this time.  Denies any blood in his stool or urine.  Denies any dark melanotic stools  ECOG PS- 1 Pain scale- 0   Review of systems- Review of Systems  Constitutional:  Negative for chills, fever, malaise/fatigue and weight loss.  HENT:  Negative for congestion, ear discharge and nosebleeds.   Eyes:  Negative for blurred vision.  Respiratory:  Negative for cough, hemoptysis, sputum production, shortness of breath and wheezing.    Cardiovascular:  Negative for chest pain, palpitations, orthopnea and claudication.  Gastrointestinal:  Negative for abdominal pain, blood in stool, constipation, diarrhea, heartburn, melena, nausea and vomiting.  Genitourinary:  Negative for dysuria, flank pain, frequency, hematuria and urgency.  Musculoskeletal:  Negative for back pain, joint pain and myalgias.  Skin:  Negative for rash.  Neurological:  Negative for dizziness, tingling, focal weakness, seizures, weakness and headaches.  Endo/Heme/Allergies:  Does not bruise/bleed easily.  Psychiatric/Behavioral:  Negative for depression and suicidal ideas. The patient does not have insomnia.      Allergies  Allergen Reactions   Minocycline Rash     Past Medical History:  Diagnosis Date   Anemia due to chronic kidney disease    Arrhythmia    atrial fibrillation   CHF (congestive heart failure) (HCC)    CKD (chronic kidney disease), stage III ([place])    Hyperlipidemia associated with type 2 diabetes mellitus ([place])    Hypertension associated with diabetes ([place])    Insulin dependent type 2 diabetes mellitus ([place])    PAD (peripheral artery disease) ([place])      Past Surgical History:  Procedure Laterality Date   ESOPHAGOGASTRODUODENOSCOPY (EGD) WITH PROPOFOL N/A 01/25/2021   Procedure: ESOPHAGOGASTRODUODENOSCOPY (EGD) WITH PROPOFOL;  Surgeon: Toledo, Benay Pike, MD;  Location: [facility] ENDOSCOPY;  Service: Gastroenterology;  Laterality: N/A;  melena, symptomatic anemia, hemoccult positive stool   PERIPHERAL ARTERIAL STENT GRAFT Bilateral    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    Social History   Socioeconomic History   Marital status: Married    Spouse name: Not on file   Number of children: Not on file   Years of education: Not on file   Highest education level: Not on  file  Occupational History   Not on file  Tobacco Use   Smoking status: Former    Years: 1.00    Types: Cigarettes   Smokeless tobacco: Never   Tobacco  comments:    10/05/20: Quit many years ago  Vaping Use   Vaping Use: Never used  Substance and Sexual Activity   Alcohol use: Never   Drug use: Never   Sexual activity: Not on file  Other Topics Concern   Not on file  Social History Narrative   Not on file   Social Determinants of Health   Financial Resource Strain: Not on file  Food Insecurity: Not on file  Transportation Needs: Not on file  Physical Activity: Not on file  Stress: Not on file  Social Connections: Not on file  Intimate Partner Violence: Not on file    Family History  Problem Relation Age of Onset   Diabetes Mother    Hypertension Mother    Diabetes Brother      Current Outpatient Medications:    amLODipine (NORVASC) 5 MG tablet, Take by mouth., Disp: , Rfl:    aspirin 81 MG chewable tablet, Chew 81 mg by mouth daily., Disp: , Rfl:    atorvastatin (LIPITOR) 40 MG tablet, Take 40 mg by mouth at bedtime., Disp: , Rfl:    Blood Glucose Monitoring Suppl (FIFTY50 GLUCOSE METER 2.0) w/Device KIT, once. Use to check blood sugars. ICD-10 E11.9, Disp: , Rfl:    clopidogrel (PLAVIX) 75 MG tablet, Take 75 mg by mouth daily., Disp: , Rfl:    empagliflozin (JARDIANCE) 10 MG TABS tablet, Take by mouth., Disp: , Rfl:    ferrous sulfate 325 (65 FE) MG tablet, Take 325 mg by mouth daily with breakfast., Disp: , Rfl:    gabapentin (NEURONTIN) 300 MG capsule, Take 300 mg by mouth at bedtime., Disp: , Rfl:    glucose blood (ONETOUCH ULTRA) test strip, USE TO CHECK BLOOD SUGAR BEFORE BREAKFAST, 2 HOURS AFTER LARGEST MEAL, AND AT BEDTIME., Disp: , Rfl:    insulin NPH Human (NOVOLIN N) 100 UNIT/ML injection, Inject 0.14 mLs (14 Units total) into the skin 2 (two) times daily before a meal., Disp: 10 mL, Rfl: 11   lisinopril (ZESTRIL) 5 MG tablet, Take 5 mg by mouth daily., Disp: , Rfl:    metoprolol tartrate (LOPRESSOR) 50 MG tablet, Take 1 tablet (50 mg total) by mouth 2 (two) times daily., Disp: 60 tablet, Rfl: 3   sodium  zirconium cyclosilicate (LOKELMA) 10 g PACK packet, Take 10 g by mouth every other day., Disp: , Rfl:    torsemide (DEMADEX) 20 MG tablet, Take 1 tablet (20 mg total) by mouth daily., Disp: 30 tablet, Rfl: 5   vitamin B-12 (CYANOCOBALAMIN) 1000 MCG tablet, Take 1 tablet (1,000 mcg total) by mouth daily., Disp: 30 tablet, Rfl: 0   ELIQUIS 5 MG TABS tablet, Take 5 mg by mouth 2 (two) times daily. (Patient not taking: Reported on 06/02/2021), Disp: , Rfl:    mupirocin ointment (BACTROBAN) 2 %, Place 1 application into the nose 2 (two) times daily. (Patient not taking: Reported on 06/02/2021), Disp: 22 g, Rfl: 0   pantoprazole (PROTONIX) 40 MG tablet, Take 1 tablet (40 mg total) by mouth daily. (Patient not taking: Reported on 06/02/2021), Disp: 30 tablet, Rfl: 0  Physical exam:  Vitals:   06/02/21 1001  BP: (!) 161/68  Pulse: (!) 50  Resp: 18  Temp: (!) 97.1 F (36.2 C)  SpO2: 97%  Weight: 218 lb 11.1 oz (99.2 kg)   Physical Exam Constitutional:      General: He is not in acute distress. Cardiovascular:     Rate and Rhythm: Normal rate and regular rhythm.     Heart sounds: Normal heart sounds.  Pulmonary:     Effort: Pulmonary effort is normal.     Breath sounds: Normal breath sounds.  Abdominal:     General: Bowel sounds are normal.     Palpations: Abdomen is soft.  Skin:    General: Skin is warm and dry.  Neurological:     Mental Status: He is alert and oriented to person, place, and time.     CMP Latest Ref Rng & Units 01/29/2021  Glucose 70 - 99 mg/dL 168(H)  BUN 8 - 23 mg/dL 62(H)  Creatinine 0.61 - 1.24 mg/dL 2.25(H)  Sodium 135 - 145 mmol/L 141  Potassium 3.5 - 5.1 mmol/L 4.2  Chloride 98 - 111 mmol/L 108  CO2 22 - 32 mmol/L 21(L)  Calcium 8.9 - 10.3 mg/dL 9.3  Total Protein 6.5 - 8.1 g/dL -  Total Bilirubin 0.3 - 1.2 mg/dL -  Alkaline Phos 38 - 126 U/L -  AST 15 - 41 U/L -  ALT 0 - 44 U/L -   CBC Latest Ref Rng & Units 02/22/2021  WBC 4.0 - 10.5 K/uL 7.3  Hemoglobin  13.0 - 17.0 g/dL 11.1(L)  Hematocrit 39.0 - 52.0 % 40.3  Platelets 150 - 400 K/uL 192     Assessment and plan- Patient is a 70 y.o. male with history of iron and B12 deficiency anemia here for routine follow-up.    Patient's hemoglobin had dropped to 5.1 in April 2022 when he had a possible bleed from angiectasia in the duodenum which was treated by APC.  Since then patient's hemoglobin has improved significantly and the most recent CBC done on 04/21/20[facility] at [facility] showed an H&H of 14.4/44.6 with an MCV of 83.  He has not had any blood work today so I will plan to get a repeat CBC but then and iron studies as well as B12 levels in 2 months and 5 months and see him in 5 months for in person or video visit   Visit Diagnosis 1. Iron deficiency anemia, unspecified iron deficiency anemia type   2. B12 deficiency      Dr. Randa Evens, MD, MPH [facility] [phone] 06/02/2021 1:09 PM

## 2021-06-03 ENCOUNTER — Encounter: Payer: Self-pay | Admitting: Oncology

## 2021-06-03 ENCOUNTER — Encounter: Payer: Self-pay | Admitting: Hematology and Oncology

## 2021-06-07 ENCOUNTER — Ambulatory Visit: Payer: Medicare HMO | Admitting: Cardiology

## 2021-06-08 DIAGNOSIS — E118 Type 2 diabetes mellitus with unspecified complications: Secondary | ICD-10-CM | POA: Diagnosis not present

## 2021-06-08 DIAGNOSIS — Z794 Long term (current) use of insulin: Secondary | ICD-10-CM | POA: Diagnosis not present

## 2021-06-16 DIAGNOSIS — N1832 Chronic kidney disease, stage 3b: Secondary | ICD-10-CM | POA: Diagnosis not present

## 2021-06-26 ENCOUNTER — Other Ambulatory Visit: Payer: Self-pay | Admitting: Physician Assistant

## 2021-06-29 NOTE — Telephone Encounter (Signed)
Please contact pt overdue for 2 month f/u. Pt needing refills.

## 2021-06-29 NOTE — Telephone Encounter (Signed)
Attempted to schedule not able to LVM, no answer

## 2021-07-20 DIAGNOSIS — Z87891 Personal history of nicotine dependence: Secondary | ICD-10-CM | POA: Diagnosis not present

## 2021-07-20 DIAGNOSIS — I2510 Atherosclerotic heart disease of native coronary artery without angina pectoris: Secondary | ICD-10-CM | POA: Diagnosis not present

## 2021-07-20 DIAGNOSIS — Z23 Encounter for immunization: Secondary | ICD-10-CM | POA: Diagnosis not present

## 2021-07-20 DIAGNOSIS — I480 Paroxysmal atrial fibrillation: Secondary | ICD-10-CM | POA: Diagnosis not present

## 2021-07-20 DIAGNOSIS — I10 Essential (primary) hypertension: Secondary | ICD-10-CM | POA: Diagnosis not present

## 2021-07-29 ENCOUNTER — Other Ambulatory Visit: Payer: Self-pay | Admitting: *Deleted

## 2021-07-29 DIAGNOSIS — E538 Deficiency of other specified B group vitamins: Secondary | ICD-10-CM

## 2021-07-29 DIAGNOSIS — D509 Iron deficiency anemia, unspecified: Secondary | ICD-10-CM

## 2021-08-02 ENCOUNTER — Other Ambulatory Visit: Payer: Self-pay | Admitting: Physician Assistant

## 2021-08-02 NOTE — Telephone Encounter (Signed)
Please reschedule office visit for further refills. Thank you! 

## 2021-08-02 NOTE — Telephone Encounter (Signed)
Attempted to schedule.  LMOV to call office.  ° °

## 2021-08-03 ENCOUNTER — Other Ambulatory Visit: Payer: Self-pay

## 2021-08-03 ENCOUNTER — Inpatient Hospital Stay: Payer: Medicare HMO | Attending: Oncology

## 2021-08-03 DIAGNOSIS — E538 Deficiency of other specified B group vitamins: Secondary | ICD-10-CM | POA: Diagnosis not present

## 2021-08-03 DIAGNOSIS — D509 Iron deficiency anemia, unspecified: Secondary | ICD-10-CM | POA: Diagnosis not present

## 2021-08-03 LAB — CBC
HCT: 45.4 % (ref 39.0–52.0)
Hemoglobin: 15.1 g/dL (ref 13.0–17.0)
MCH: 28.3 pg (ref 26.0–34.0)
MCHC: 33.3 g/dL (ref 30.0–36.0)
MCV: 85.2 fL (ref 80.0–100.0)
Platelets: 167 10*3/uL (ref 150–400)
RBC: 5.33 MIL/uL (ref 4.22–5.81)
RDW: 13.5 % (ref 11.5–15.5)
WBC: 5 10*3/uL (ref 4.0–10.5)
nRBC: 0 % (ref 0.0–0.2)

## 2021-08-03 LAB — IRON AND TIBC
Iron: 63 ug/dL (ref 45–182)
Saturation Ratios: 17 % — ABNORMAL LOW (ref 17.9–39.5)
TIBC: 363 ug/dL (ref 250–450)
UIBC: 300 ug/dL

## 2021-08-03 LAB — VITAMIN B12: Vitamin B-12: 259 pg/mL (ref 180–914)

## 2021-08-03 LAB — FERRITIN: Ferritin: 18 ng/mL — ABNORMAL LOW (ref 24–336)

## 2021-08-03 NOTE — Progress Notes (Signed)
Spoke to wife and she says he takes it sometime but does not do it on reg. Basis. I offered IV iron. She thinks he will want to continue oral iron tablets and do it on regular basis.

## 2021-08-25 DIAGNOSIS — E113591 Type 2 diabetes mellitus with proliferative diabetic retinopathy without macular edema, right eye: Secondary | ICD-10-CM | POA: Diagnosis not present

## 2021-08-25 DIAGNOSIS — H43393 Other vitreous opacities, bilateral: Secondary | ICD-10-CM | POA: Diagnosis not present

## 2021-08-25 DIAGNOSIS — E113492 Type 2 diabetes mellitus with severe nonproliferative diabetic retinopathy without macular edema, left eye: Secondary | ICD-10-CM | POA: Diagnosis not present

## 2021-08-25 DIAGNOSIS — H2513 Age-related nuclear cataract, bilateral: Secondary | ICD-10-CM | POA: Diagnosis not present

## 2021-09-02 DIAGNOSIS — I739 Peripheral vascular disease, unspecified: Secondary | ICD-10-CM | POA: Diagnosis not present

## 2021-09-02 DIAGNOSIS — E785 Hyperlipidemia, unspecified: Secondary | ICD-10-CM | POA: Diagnosis not present

## 2021-10-11 ENCOUNTER — Other Ambulatory Visit: Payer: Self-pay | Admitting: *Deleted

## 2021-10-11 MED ORDER — TORSEMIDE 20 MG PO TABS
20.0000 mg | ORAL_TABLET | Freq: Every day | ORAL | 1 refills | Status: DC
Start: 2021-10-11 — End: 2024-03-07

## 2021-10-14 DIAGNOSIS — E119 Type 2 diabetes mellitus without complications: Secondary | ICD-10-CM | POA: Diagnosis not present

## 2021-10-26 DIAGNOSIS — Z87891 Personal history of nicotine dependence: Secondary | ICD-10-CM | POA: Diagnosis not present

## 2021-10-26 DIAGNOSIS — Z136 Encounter for screening for cardiovascular disorders: Secondary | ICD-10-CM | POA: Diagnosis not present

## 2021-10-26 DIAGNOSIS — E785 Hyperlipidemia, unspecified: Secondary | ICD-10-CM | POA: Diagnosis not present

## 2021-10-26 DIAGNOSIS — I10 Essential (primary) hypertension: Secondary | ICD-10-CM | POA: Diagnosis not present

## 2021-10-26 DIAGNOSIS — E118 Type 2 diabetes mellitus with unspecified complications: Secondary | ICD-10-CM | POA: Diagnosis not present

## 2021-10-26 DIAGNOSIS — Z794 Long term (current) use of insulin: Secondary | ICD-10-CM | POA: Diagnosis not present

## 2021-10-27 ENCOUNTER — Other Ambulatory Visit: Payer: Self-pay | Admitting: *Deleted

## 2021-10-27 DIAGNOSIS — D509 Iron deficiency anemia, unspecified: Secondary | ICD-10-CM

## 2021-10-27 DIAGNOSIS — E538 Deficiency of other specified B group vitamins: Secondary | ICD-10-CM

## 2021-11-02 ENCOUNTER — Other Ambulatory Visit: Payer: Medicare HMO

## 2021-11-03 ENCOUNTER — Other Ambulatory Visit: Payer: Medicare HMO

## 2021-11-03 ENCOUNTER — Inpatient Hospital Stay: Payer: Medicare HMO | Attending: Oncology

## 2021-11-03 ENCOUNTER — Encounter: Payer: Self-pay | Admitting: Oncology

## 2021-11-03 ENCOUNTER — Inpatient Hospital Stay: Payer: Medicare HMO | Admitting: Oncology

## 2021-11-03 ENCOUNTER — Ambulatory Visit: Payer: Medicare HMO | Admitting: Oncology

## 2021-11-08 ENCOUNTER — Telehealth: Payer: Medicare HMO | Admitting: Oncology

## 2021-11-25 DIAGNOSIS — N189 Chronic kidney disease, unspecified: Secondary | ICD-10-CM | POA: Diagnosis not present

## 2021-11-25 DIAGNOSIS — N1832 Chronic kidney disease, stage 3b: Secondary | ICD-10-CM | POA: Diagnosis not present

## 2021-11-25 DIAGNOSIS — Z862 Personal history of diseases of the blood and blood-forming organs and certain disorders involving the immune mechanism: Secondary | ICD-10-CM | POA: Diagnosis not present

## 2021-12-01 DIAGNOSIS — N1832 Chronic kidney disease, stage 3b: Secondary | ICD-10-CM | POA: Diagnosis not present

## 2021-12-01 DIAGNOSIS — I10 Essential (primary) hypertension: Secondary | ICD-10-CM | POA: Diagnosis not present

## 2021-12-01 DIAGNOSIS — R531 Weakness: Secondary | ICD-10-CM | POA: Diagnosis not present

## 2021-12-01 DIAGNOSIS — E1169 Type 2 diabetes mellitus with other specified complication: Secondary | ICD-10-CM | POA: Diagnosis not present

## 2021-12-01 DIAGNOSIS — Z794 Long term (current) use of insulin: Secondary | ICD-10-CM | POA: Diagnosis not present

## 2021-12-24 DIAGNOSIS — E113492 Type 2 diabetes mellitus with severe nonproliferative diabetic retinopathy without macular edema, left eye: Secondary | ICD-10-CM | POA: Diagnosis not present

## 2021-12-24 DIAGNOSIS — H2513 Age-related nuclear cataract, bilateral: Secondary | ICD-10-CM | POA: Diagnosis not present

## 2021-12-24 DIAGNOSIS — E113511 Type 2 diabetes mellitus with proliferative diabetic retinopathy with macular edema, right eye: Secondary | ICD-10-CM | POA: Diagnosis not present

## 2021-12-24 DIAGNOSIS — H43393 Other vitreous opacities, bilateral: Secondary | ICD-10-CM | POA: Diagnosis not present

## 2022-03-08 DIAGNOSIS — Z9862 Peripheral vascular angioplasty status: Secondary | ICD-10-CM | POA: Diagnosis not present

## 2022-03-08 DIAGNOSIS — R001 Bradycardia, unspecified: Secondary | ICD-10-CM | POA: Diagnosis not present

## 2022-03-08 DIAGNOSIS — R944 Abnormal results of kidney function studies: Secondary | ICD-10-CM | POA: Diagnosis not present

## 2022-03-08 DIAGNOSIS — G8194 Hemiplegia, unspecified affecting left nondominant side: Secondary | ICD-10-CM | POA: Diagnosis not present

## 2022-03-08 DIAGNOSIS — I10 Essential (primary) hypertension: Secondary | ICD-10-CM | POA: Diagnosis not present

## 2022-03-08 DIAGNOSIS — I2510 Atherosclerotic heart disease of native coronary artery without angina pectoris: Secondary | ICD-10-CM | POA: Diagnosis not present

## 2022-03-08 DIAGNOSIS — G936 Cerebral edema: Secondary | ICD-10-CM | POA: Diagnosis not present

## 2022-03-08 DIAGNOSIS — Z743 Need for continuous supervision: Secondary | ICD-10-CM | POA: Diagnosis not present

## 2022-03-08 DIAGNOSIS — I639 Cerebral infarction, unspecified: Secondary | ICD-10-CM | POA: Diagnosis not present

## 2022-03-08 DIAGNOSIS — R569 Unspecified convulsions: Secondary | ICD-10-CM | POA: Diagnosis not present

## 2022-03-08 DIAGNOSIS — E1122 Type 2 diabetes mellitus with diabetic chronic kidney disease: Secondary | ICD-10-CM | POA: Diagnosis not present

## 2022-03-08 DIAGNOSIS — I129 Hypertensive chronic kidney disease with stage 1 through stage 4 chronic kidney disease, or unspecified chronic kidney disease: Secondary | ICD-10-CM | POA: Diagnosis not present

## 2022-03-08 DIAGNOSIS — Z87891 Personal history of nicotine dependence: Secondary | ICD-10-CM | POA: Diagnosis not present

## 2022-03-08 DIAGNOSIS — E113292 Type 2 diabetes mellitus with mild nonproliferative diabetic retinopathy without macular edema, left eye: Secondary | ICD-10-CM | POA: Diagnosis not present

## 2022-03-08 DIAGNOSIS — R4182 Altered mental status, unspecified: Secondary | ICD-10-CM | POA: Diagnosis not present

## 2022-03-08 DIAGNOSIS — I130 Hypertensive heart and chronic kidney disease with heart failure and stage 1 through stage 4 chronic kidney disease, or unspecified chronic kidney disease: Secondary | ICD-10-CM | POA: Diagnosis not present

## 2022-03-08 DIAGNOSIS — R0902 Hypoxemia: Secondary | ICD-10-CM | POA: Diagnosis not present

## 2022-03-08 DIAGNOSIS — I63512 Cerebral infarction due to unspecified occlusion or stenosis of left middle cerebral artery: Secondary | ICD-10-CM | POA: Diagnosis not present

## 2022-03-08 DIAGNOSIS — E1151 Type 2 diabetes mellitus with diabetic peripheral angiopathy without gangrene: Secondary | ICD-10-CM | POA: Diagnosis not present

## 2022-03-08 DIAGNOSIS — N179 Acute kidney failure, unspecified: Secondary | ICD-10-CM | POA: Diagnosis not present

## 2022-03-08 DIAGNOSIS — I5032 Chronic diastolic (congestive) heart failure: Secondary | ICD-10-CM | POA: Diagnosis not present

## 2022-03-08 DIAGNOSIS — E119 Type 2 diabetes mellitus without complications: Secondary | ICD-10-CM | POA: Diagnosis not present

## 2022-03-08 DIAGNOSIS — I4891 Unspecified atrial fibrillation: Secondary | ICD-10-CM | POA: Diagnosis not present

## 2022-03-08 DIAGNOSIS — H43393 Other vitreous opacities, bilateral: Secondary | ICD-10-CM | POA: Diagnosis not present

## 2022-03-08 DIAGNOSIS — I739 Peripheral vascular disease, unspecified: Secondary | ICD-10-CM | POA: Diagnosis not present

## 2022-03-08 DIAGNOSIS — I480 Paroxysmal atrial fibrillation: Secondary | ICD-10-CM | POA: Diagnosis not present

## 2022-03-08 DIAGNOSIS — N19 Unspecified kidney failure: Secondary | ICD-10-CM | POA: Diagnosis not present

## 2022-03-08 DIAGNOSIS — Z7902 Long term (current) use of antithrombotics/antiplatelets: Secondary | ICD-10-CM | POA: Diagnosis not present

## 2022-03-08 DIAGNOSIS — R29726 NIHSS score 26: Secondary | ICD-10-CM | POA: Diagnosis not present

## 2022-03-08 DIAGNOSIS — N189 Chronic kidney disease, unspecified: Secondary | ICD-10-CM | POA: Diagnosis not present

## 2022-03-08 DIAGNOSIS — K5900 Constipation, unspecified: Secondary | ICD-10-CM | POA: Diagnosis not present

## 2022-03-08 DIAGNOSIS — I618 Other nontraumatic intracerebral hemorrhage: Secondary | ICD-10-CM | POA: Diagnosis not present

## 2022-03-08 DIAGNOSIS — E785 Hyperlipidemia, unspecified: Secondary | ICD-10-CM | POA: Diagnosis not present

## 2022-03-08 DIAGNOSIS — I6389 Other cerebral infarction: Secondary | ICD-10-CM | POA: Diagnosis not present

## 2022-03-08 DIAGNOSIS — Z794 Long term (current) use of insulin: Secondary | ICD-10-CM | POA: Diagnosis not present

## 2022-03-08 DIAGNOSIS — I619 Nontraumatic intracerebral hemorrhage, unspecified: Secondary | ICD-10-CM | POA: Diagnosis not present

## 2022-03-08 DIAGNOSIS — I611 Nontraumatic intracerebral hemorrhage in hemisphere, cortical: Secondary | ICD-10-CM | POA: Diagnosis not present

## 2022-03-08 DIAGNOSIS — Z7982 Long term (current) use of aspirin: Secondary | ICD-10-CM | POA: Diagnosis not present

## 2022-03-08 DIAGNOSIS — R404 Transient alteration of awareness: Secondary | ICD-10-CM | POA: Diagnosis not present

## 2022-03-09 DIAGNOSIS — G936 Cerebral edema: Secondary | ICD-10-CM | POA: Diagnosis not present

## 2022-03-09 DIAGNOSIS — R001 Bradycardia, unspecified: Secondary | ICD-10-CM | POA: Diagnosis not present

## 2022-03-09 DIAGNOSIS — I63512 Cerebral infarction due to unspecified occlusion or stenosis of left middle cerebral artery: Secondary | ICD-10-CM | POA: Diagnosis not present

## 2022-03-09 DIAGNOSIS — I639 Cerebral infarction, unspecified: Secondary | ICD-10-CM | POA: Diagnosis not present

## 2022-03-15 DIAGNOSIS — I618 Other nontraumatic intracerebral hemorrhage: Secondary | ICD-10-CM | POA: Diagnosis not present

## 2022-03-15 DIAGNOSIS — I4891 Unspecified atrial fibrillation: Secondary | ICD-10-CM | POA: Diagnosis not present

## 2022-03-15 DIAGNOSIS — I10 Essential (primary) hypertension: Secondary | ICD-10-CM | POA: Diagnosis not present

## 2022-03-15 DIAGNOSIS — I739 Peripheral vascular disease, unspecified: Secondary | ICD-10-CM | POA: Diagnosis not present

## 2022-03-15 DIAGNOSIS — E785 Hyperlipidemia, unspecified: Secondary | ICD-10-CM | POA: Diagnosis not present

## 2022-03-15 DIAGNOSIS — Z794 Long term (current) use of insulin: Secondary | ICD-10-CM | POA: Diagnosis not present

## 2022-03-15 DIAGNOSIS — R569 Unspecified convulsions: Secondary | ICD-10-CM | POA: Diagnosis not present

## 2022-03-15 DIAGNOSIS — E119 Type 2 diabetes mellitus without complications: Secondary | ICD-10-CM | POA: Diagnosis not present

## 2022-03-16 DIAGNOSIS — I70203 Unspecified atherosclerosis of native arteries of extremities, bilateral legs: Secondary | ICD-10-CM | POA: Diagnosis not present

## 2022-03-16 DIAGNOSIS — I480 Paroxysmal atrial fibrillation: Secondary | ICD-10-CM | POA: Diagnosis not present

## 2022-03-16 DIAGNOSIS — Z794 Long term (current) use of insulin: Secondary | ICD-10-CM | POA: Diagnosis not present

## 2022-03-16 DIAGNOSIS — I5030 Unspecified diastolic (congestive) heart failure: Secondary | ICD-10-CM | POA: Diagnosis not present

## 2022-03-16 DIAGNOSIS — E1151 Type 2 diabetes mellitus with diabetic peripheral angiopathy without gangrene: Secondary | ICD-10-CM | POA: Diagnosis not present

## 2022-03-16 DIAGNOSIS — Z95820 Peripheral vascular angioplasty status with implants and grafts: Secondary | ICD-10-CM | POA: Diagnosis not present

## 2022-03-16 DIAGNOSIS — I69154 Hemiplegia and hemiparesis following nontraumatic intracerebral hemorrhage affecting left non-dominant side: Secondary | ICD-10-CM | POA: Diagnosis not present

## 2022-03-16 DIAGNOSIS — D631 Anemia in chronic kidney disease: Secondary | ICD-10-CM | POA: Diagnosis not present

## 2022-03-16 DIAGNOSIS — I130 Hypertensive heart and chronic kidney disease with heart failure and stage 1 through stage 4 chronic kidney disease, or unspecified chronic kidney disease: Secondary | ICD-10-CM | POA: Diagnosis not present

## 2022-03-16 DIAGNOSIS — G40909 Epilepsy, unspecified, not intractable, without status epilepticus: Secondary | ICD-10-CM | POA: Diagnosis not present

## 2022-03-16 DIAGNOSIS — E785 Hyperlipidemia, unspecified: Secondary | ICD-10-CM | POA: Diagnosis not present

## 2022-03-16 DIAGNOSIS — E1122 Type 2 diabetes mellitus with diabetic chronic kidney disease: Secondary | ICD-10-CM | POA: Diagnosis not present

## 2022-03-16 DIAGNOSIS — N189 Chronic kidney disease, unspecified: Secondary | ICD-10-CM | POA: Diagnosis not present

## 2022-03-16 DIAGNOSIS — Z8631 Personal history of diabetic foot ulcer: Secondary | ICD-10-CM | POA: Diagnosis not present

## 2022-03-16 DIAGNOSIS — Z89432 Acquired absence of left foot: Secondary | ICD-10-CM | POA: Diagnosis not present

## 2022-03-16 DIAGNOSIS — I10 Essential (primary) hypertension: Secondary | ICD-10-CM | POA: Diagnosis not present

## 2022-03-16 DIAGNOSIS — E113492 Type 2 diabetes mellitus with severe nonproliferative diabetic retinopathy without macular edema, left eye: Secondary | ICD-10-CM | POA: Diagnosis not present

## 2022-03-16 DIAGNOSIS — Z7902 Long term (current) use of antithrombotics/antiplatelets: Secondary | ICD-10-CM | POA: Diagnosis not present

## 2022-03-16 DIAGNOSIS — Z95818 Presence of other cardiac implants and grafts: Secondary | ICD-10-CM | POA: Diagnosis not present

## 2022-03-16 DIAGNOSIS — Z7982 Long term (current) use of aspirin: Secondary | ICD-10-CM | POA: Diagnosis not present

## 2022-03-18 DIAGNOSIS — E785 Hyperlipidemia, unspecified: Secondary | ICD-10-CM | POA: Diagnosis not present

## 2022-03-18 DIAGNOSIS — I69154 Hemiplegia and hemiparesis following nontraumatic intracerebral hemorrhage affecting left non-dominant side: Secondary | ICD-10-CM | POA: Diagnosis not present

## 2022-03-18 DIAGNOSIS — Z95818 Presence of other cardiac implants and grafts: Secondary | ICD-10-CM | POA: Diagnosis not present

## 2022-03-18 DIAGNOSIS — E113492 Type 2 diabetes mellitus with severe nonproliferative diabetic retinopathy without macular edema, left eye: Secondary | ICD-10-CM | POA: Diagnosis not present

## 2022-03-18 DIAGNOSIS — I5030 Unspecified diastolic (congestive) heart failure: Secondary | ICD-10-CM | POA: Diagnosis not present

## 2022-03-18 DIAGNOSIS — I70203 Unspecified atherosclerosis of native arteries of extremities, bilateral legs: Secondary | ICD-10-CM | POA: Diagnosis not present

## 2022-03-18 DIAGNOSIS — Z95820 Peripheral vascular angioplasty status with implants and grafts: Secondary | ICD-10-CM | POA: Diagnosis not present

## 2022-03-18 DIAGNOSIS — I130 Hypertensive heart and chronic kidney disease with heart failure and stage 1 through stage 4 chronic kidney disease, or unspecified chronic kidney disease: Secondary | ICD-10-CM | POA: Diagnosis not present

## 2022-03-18 DIAGNOSIS — Z7982 Long term (current) use of aspirin: Secondary | ICD-10-CM | POA: Diagnosis not present

## 2022-03-18 DIAGNOSIS — N189 Chronic kidney disease, unspecified: Secondary | ICD-10-CM | POA: Diagnosis not present

## 2022-03-18 DIAGNOSIS — I480 Paroxysmal atrial fibrillation: Secondary | ICD-10-CM | POA: Diagnosis not present

## 2022-03-18 DIAGNOSIS — Z8631 Personal history of diabetic foot ulcer: Secondary | ICD-10-CM | POA: Diagnosis not present

## 2022-03-18 DIAGNOSIS — D631 Anemia in chronic kidney disease: Secondary | ICD-10-CM | POA: Diagnosis not present

## 2022-03-18 DIAGNOSIS — Z7902 Long term (current) use of antithrombotics/antiplatelets: Secondary | ICD-10-CM | POA: Diagnosis not present

## 2022-03-18 DIAGNOSIS — E1151 Type 2 diabetes mellitus with diabetic peripheral angiopathy without gangrene: Secondary | ICD-10-CM | POA: Diagnosis not present

## 2022-03-18 DIAGNOSIS — Z794 Long term (current) use of insulin: Secondary | ICD-10-CM | POA: Diagnosis not present

## 2022-03-18 DIAGNOSIS — E1122 Type 2 diabetes mellitus with diabetic chronic kidney disease: Secondary | ICD-10-CM | POA: Diagnosis not present

## 2022-03-18 DIAGNOSIS — Z89432 Acquired absence of left foot: Secondary | ICD-10-CM | POA: Diagnosis not present

## 2022-03-18 DIAGNOSIS — G40909 Epilepsy, unspecified, not intractable, without status epilepticus: Secondary | ICD-10-CM | POA: Diagnosis not present

## 2022-03-22 DIAGNOSIS — I70203 Unspecified atherosclerosis of native arteries of extremities, bilateral legs: Secondary | ICD-10-CM | POA: Diagnosis not present

## 2022-03-22 DIAGNOSIS — I69154 Hemiplegia and hemiparesis following nontraumatic intracerebral hemorrhage affecting left non-dominant side: Secondary | ICD-10-CM | POA: Diagnosis not present

## 2022-03-22 DIAGNOSIS — Z95818 Presence of other cardiac implants and grafts: Secondary | ICD-10-CM | POA: Diagnosis not present

## 2022-03-22 DIAGNOSIS — I130 Hypertensive heart and chronic kidney disease with heart failure and stage 1 through stage 4 chronic kidney disease, or unspecified chronic kidney disease: Secondary | ICD-10-CM | POA: Diagnosis not present

## 2022-03-22 DIAGNOSIS — I5030 Unspecified diastolic (congestive) heart failure: Secondary | ICD-10-CM | POA: Diagnosis not present

## 2022-03-22 DIAGNOSIS — D631 Anemia in chronic kidney disease: Secondary | ICD-10-CM | POA: Diagnosis not present

## 2022-03-22 DIAGNOSIS — Z8631 Personal history of diabetic foot ulcer: Secondary | ICD-10-CM | POA: Diagnosis not present

## 2022-03-22 DIAGNOSIS — Z95820 Peripheral vascular angioplasty status with implants and grafts: Secondary | ICD-10-CM | POA: Diagnosis not present

## 2022-03-22 DIAGNOSIS — N189 Chronic kidney disease, unspecified: Secondary | ICD-10-CM | POA: Diagnosis not present

## 2022-03-22 DIAGNOSIS — E785 Hyperlipidemia, unspecified: Secondary | ICD-10-CM | POA: Diagnosis not present

## 2022-03-22 DIAGNOSIS — Z794 Long term (current) use of insulin: Secondary | ICD-10-CM | POA: Diagnosis not present

## 2022-03-22 DIAGNOSIS — Z7902 Long term (current) use of antithrombotics/antiplatelets: Secondary | ICD-10-CM | POA: Diagnosis not present

## 2022-03-22 DIAGNOSIS — E1151 Type 2 diabetes mellitus with diabetic peripheral angiopathy without gangrene: Secondary | ICD-10-CM | POA: Diagnosis not present

## 2022-03-22 DIAGNOSIS — Z7982 Long term (current) use of aspirin: Secondary | ICD-10-CM | POA: Diagnosis not present

## 2022-03-22 DIAGNOSIS — I480 Paroxysmal atrial fibrillation: Secondary | ICD-10-CM | POA: Diagnosis not present

## 2022-03-22 DIAGNOSIS — E1122 Type 2 diabetes mellitus with diabetic chronic kidney disease: Secondary | ICD-10-CM | POA: Diagnosis not present

## 2022-03-22 DIAGNOSIS — Z89432 Acquired absence of left foot: Secondary | ICD-10-CM | POA: Diagnosis not present

## 2022-03-22 DIAGNOSIS — G40909 Epilepsy, unspecified, not intractable, without status epilepticus: Secondary | ICD-10-CM | POA: Diagnosis not present

## 2022-03-22 DIAGNOSIS — E113492 Type 2 diabetes mellitus with severe nonproliferative diabetic retinopathy without macular edema, left eye: Secondary | ICD-10-CM | POA: Diagnosis not present

## 2022-03-23 DIAGNOSIS — I611 Nontraumatic intracerebral hemorrhage in hemisphere, cortical: Secondary | ICD-10-CM | POA: Diagnosis not present

## 2022-03-23 DIAGNOSIS — R799 Abnormal finding of blood chemistry, unspecified: Secondary | ICD-10-CM | POA: Diagnosis not present

## 2022-03-23 DIAGNOSIS — E1169 Type 2 diabetes mellitus with other specified complication: Secondary | ICD-10-CM | POA: Diagnosis not present

## 2022-03-23 DIAGNOSIS — G40909 Epilepsy, unspecified, not intractable, without status epilepticus: Secondary | ICD-10-CM | POA: Diagnosis not present

## 2022-03-23 DIAGNOSIS — I160 Hypertensive urgency: Secondary | ICD-10-CM | POA: Diagnosis not present

## 2022-03-23 DIAGNOSIS — Z09 Encounter for follow-up examination after completed treatment for conditions other than malignant neoplasm: Secondary | ICD-10-CM | POA: Diagnosis not present

## 2022-03-23 DIAGNOSIS — I10 Essential (primary) hypertension: Secondary | ICD-10-CM | POA: Diagnosis not present

## 2022-03-23 DIAGNOSIS — I618 Other nontraumatic intracerebral hemorrhage: Secondary | ICD-10-CM | POA: Diagnosis not present

## 2022-03-23 DIAGNOSIS — I480 Paroxysmal atrial fibrillation: Secondary | ICD-10-CM | POA: Diagnosis not present

## 2022-03-23 DIAGNOSIS — Z794 Long term (current) use of insulin: Secondary | ICD-10-CM | POA: Diagnosis not present

## 2022-03-23 DIAGNOSIS — I5189 Other ill-defined heart diseases: Secondary | ICD-10-CM | POA: Diagnosis not present

## 2022-03-23 DIAGNOSIS — I2510 Atherosclerotic heart disease of native coronary artery without angina pectoris: Secondary | ICD-10-CM | POA: Diagnosis not present

## 2022-03-23 DIAGNOSIS — I739 Peripheral vascular disease, unspecified: Secondary | ICD-10-CM | POA: Diagnosis not present

## 2022-03-24 DIAGNOSIS — N189 Chronic kidney disease, unspecified: Secondary | ICD-10-CM | POA: Diagnosis not present

## 2022-03-24 DIAGNOSIS — Z794 Long term (current) use of insulin: Secondary | ICD-10-CM | POA: Diagnosis not present

## 2022-03-24 DIAGNOSIS — Z89432 Acquired absence of left foot: Secondary | ICD-10-CM | POA: Diagnosis not present

## 2022-03-24 DIAGNOSIS — I5030 Unspecified diastolic (congestive) heart failure: Secondary | ICD-10-CM | POA: Diagnosis not present

## 2022-03-24 DIAGNOSIS — Z7982 Long term (current) use of aspirin: Secondary | ICD-10-CM | POA: Diagnosis not present

## 2022-03-24 DIAGNOSIS — D631 Anemia in chronic kidney disease: Secondary | ICD-10-CM | POA: Diagnosis not present

## 2022-03-24 DIAGNOSIS — E1122 Type 2 diabetes mellitus with diabetic chronic kidney disease: Secondary | ICD-10-CM | POA: Diagnosis not present

## 2022-03-24 DIAGNOSIS — I480 Paroxysmal atrial fibrillation: Secondary | ICD-10-CM | POA: Diagnosis not present

## 2022-03-24 DIAGNOSIS — I130 Hypertensive heart and chronic kidney disease with heart failure and stage 1 through stage 4 chronic kidney disease, or unspecified chronic kidney disease: Secondary | ICD-10-CM | POA: Diagnosis not present

## 2022-03-24 DIAGNOSIS — G40909 Epilepsy, unspecified, not intractable, without status epilepticus: Secondary | ICD-10-CM | POA: Diagnosis not present

## 2022-03-24 DIAGNOSIS — E785 Hyperlipidemia, unspecified: Secondary | ICD-10-CM | POA: Diagnosis not present

## 2022-03-24 DIAGNOSIS — Z7902 Long term (current) use of antithrombotics/antiplatelets: Secondary | ICD-10-CM | POA: Diagnosis not present

## 2022-03-24 DIAGNOSIS — I70203 Unspecified atherosclerosis of native arteries of extremities, bilateral legs: Secondary | ICD-10-CM | POA: Diagnosis not present

## 2022-03-24 DIAGNOSIS — Z95820 Peripheral vascular angioplasty status with implants and grafts: Secondary | ICD-10-CM | POA: Diagnosis not present

## 2022-03-24 DIAGNOSIS — Z95818 Presence of other cardiac implants and grafts: Secondary | ICD-10-CM | POA: Diagnosis not present

## 2022-03-24 DIAGNOSIS — I69154 Hemiplegia and hemiparesis following nontraumatic intracerebral hemorrhage affecting left non-dominant side: Secondary | ICD-10-CM | POA: Diagnosis not present

## 2022-03-24 DIAGNOSIS — E113492 Type 2 diabetes mellitus with severe nonproliferative diabetic retinopathy without macular edema, left eye: Secondary | ICD-10-CM | POA: Diagnosis not present

## 2022-03-24 DIAGNOSIS — Z8631 Personal history of diabetic foot ulcer: Secondary | ICD-10-CM | POA: Diagnosis not present

## 2022-03-24 DIAGNOSIS — E1151 Type 2 diabetes mellitus with diabetic peripheral angiopathy without gangrene: Secondary | ICD-10-CM | POA: Diagnosis not present

## 2022-03-29 DIAGNOSIS — Z794 Long term (current) use of insulin: Secondary | ICD-10-CM | POA: Diagnosis not present

## 2022-03-29 DIAGNOSIS — E1122 Type 2 diabetes mellitus with diabetic chronic kidney disease: Secondary | ICD-10-CM | POA: Diagnosis not present

## 2022-03-29 DIAGNOSIS — D631 Anemia in chronic kidney disease: Secondary | ICD-10-CM | POA: Diagnosis not present

## 2022-03-29 DIAGNOSIS — R600 Localized edema: Secondary | ICD-10-CM | POA: Diagnosis not present

## 2022-03-29 DIAGNOSIS — G40909 Epilepsy, unspecified, not intractable, without status epilepticus: Secondary | ICD-10-CM | POA: Diagnosis not present

## 2022-03-29 DIAGNOSIS — E1151 Type 2 diabetes mellitus with diabetic peripheral angiopathy without gangrene: Secondary | ICD-10-CM | POA: Diagnosis not present

## 2022-03-29 DIAGNOSIS — I69154 Hemiplegia and hemiparesis following nontraumatic intracerebral hemorrhage affecting left non-dominant side: Secondary | ICD-10-CM | POA: Diagnosis not present

## 2022-03-29 DIAGNOSIS — E1169 Type 2 diabetes mellitus with other specified complication: Secondary | ICD-10-CM | POA: Diagnosis not present

## 2022-03-29 DIAGNOSIS — E118 Type 2 diabetes mellitus with unspecified complications: Secondary | ICD-10-CM | POA: Diagnosis not present

## 2022-03-29 DIAGNOSIS — I5030 Unspecified diastolic (congestive) heart failure: Secondary | ICD-10-CM | POA: Diagnosis not present

## 2022-03-29 DIAGNOSIS — Z89432 Acquired absence of left foot: Secondary | ICD-10-CM | POA: Diagnosis not present

## 2022-03-29 DIAGNOSIS — I10 Essential (primary) hypertension: Secondary | ICD-10-CM | POA: Diagnosis not present

## 2022-03-29 DIAGNOSIS — E875 Hyperkalemia: Secondary | ICD-10-CM | POA: Diagnosis not present

## 2022-03-29 DIAGNOSIS — I70203 Unspecified atherosclerosis of native arteries of extremities, bilateral legs: Secondary | ICD-10-CM | POA: Diagnosis not present

## 2022-03-29 DIAGNOSIS — N189 Chronic kidney disease, unspecified: Secondary | ICD-10-CM | POA: Diagnosis not present

## 2022-03-29 DIAGNOSIS — Z23 Encounter for immunization: Secondary | ICD-10-CM | POA: Diagnosis not present

## 2022-03-29 DIAGNOSIS — I5189 Other ill-defined heart diseases: Secondary | ICD-10-CM | POA: Diagnosis not present

## 2022-03-29 DIAGNOSIS — E113492 Type 2 diabetes mellitus with severe nonproliferative diabetic retinopathy without macular edema, left eye: Secondary | ICD-10-CM | POA: Diagnosis not present

## 2022-03-29 DIAGNOSIS — Z8631 Personal history of diabetic foot ulcer: Secondary | ICD-10-CM | POA: Diagnosis not present

## 2022-03-29 DIAGNOSIS — Z95818 Presence of other cardiac implants and grafts: Secondary | ICD-10-CM | POA: Diagnosis not present

## 2022-03-29 DIAGNOSIS — Z95820 Peripheral vascular angioplasty status with implants and grafts: Secondary | ICD-10-CM | POA: Diagnosis not present

## 2022-03-29 DIAGNOSIS — I130 Hypertensive heart and chronic kidney disease with heart failure and stage 1 through stage 4 chronic kidney disease, or unspecified chronic kidney disease: Secondary | ICD-10-CM | POA: Diagnosis not present

## 2022-03-29 DIAGNOSIS — Z7982 Long term (current) use of aspirin: Secondary | ICD-10-CM | POA: Diagnosis not present

## 2022-03-29 DIAGNOSIS — I480 Paroxysmal atrial fibrillation: Secondary | ICD-10-CM | POA: Diagnosis not present

## 2022-03-29 DIAGNOSIS — E785 Hyperlipidemia, unspecified: Secondary | ICD-10-CM | POA: Diagnosis not present

## 2022-03-29 DIAGNOSIS — Z7902 Long term (current) use of antithrombotics/antiplatelets: Secondary | ICD-10-CM | POA: Diagnosis not present

## 2022-03-31 DIAGNOSIS — D631 Anemia in chronic kidney disease: Secondary | ICD-10-CM | POA: Diagnosis not present

## 2022-03-31 DIAGNOSIS — Z95820 Peripheral vascular angioplasty status with implants and grafts: Secondary | ICD-10-CM | POA: Diagnosis not present

## 2022-03-31 DIAGNOSIS — G40909 Epilepsy, unspecified, not intractable, without status epilepticus: Secondary | ICD-10-CM | POA: Diagnosis not present

## 2022-03-31 DIAGNOSIS — Z7982 Long term (current) use of aspirin: Secondary | ICD-10-CM | POA: Diagnosis not present

## 2022-03-31 DIAGNOSIS — I69154 Hemiplegia and hemiparesis following nontraumatic intracerebral hemorrhage affecting left non-dominant side: Secondary | ICD-10-CM | POA: Diagnosis not present

## 2022-03-31 DIAGNOSIS — Z8631 Personal history of diabetic foot ulcer: Secondary | ICD-10-CM | POA: Diagnosis not present

## 2022-03-31 DIAGNOSIS — Z95818 Presence of other cardiac implants and grafts: Secondary | ICD-10-CM | POA: Diagnosis not present

## 2022-03-31 DIAGNOSIS — Z89432 Acquired absence of left foot: Secondary | ICD-10-CM | POA: Diagnosis not present

## 2022-03-31 DIAGNOSIS — E785 Hyperlipidemia, unspecified: Secondary | ICD-10-CM | POA: Diagnosis not present

## 2022-03-31 DIAGNOSIS — Z794 Long term (current) use of insulin: Secondary | ICD-10-CM | POA: Diagnosis not present

## 2022-03-31 DIAGNOSIS — E1151 Type 2 diabetes mellitus with diabetic peripheral angiopathy without gangrene: Secondary | ICD-10-CM | POA: Diagnosis not present

## 2022-03-31 DIAGNOSIS — E113492 Type 2 diabetes mellitus with severe nonproliferative diabetic retinopathy without macular edema, left eye: Secondary | ICD-10-CM | POA: Diagnosis not present

## 2022-03-31 DIAGNOSIS — I70203 Unspecified atherosclerosis of native arteries of extremities, bilateral legs: Secondary | ICD-10-CM | POA: Diagnosis not present

## 2022-03-31 DIAGNOSIS — I130 Hypertensive heart and chronic kidney disease with heart failure and stage 1 through stage 4 chronic kidney disease, or unspecified chronic kidney disease: Secondary | ICD-10-CM | POA: Diagnosis not present

## 2022-03-31 DIAGNOSIS — Z7902 Long term (current) use of antithrombotics/antiplatelets: Secondary | ICD-10-CM | POA: Diagnosis not present

## 2022-03-31 DIAGNOSIS — I480 Paroxysmal atrial fibrillation: Secondary | ICD-10-CM | POA: Diagnosis not present

## 2022-03-31 DIAGNOSIS — E1122 Type 2 diabetes mellitus with diabetic chronic kidney disease: Secondary | ICD-10-CM | POA: Diagnosis not present

## 2022-03-31 DIAGNOSIS — N189 Chronic kidney disease, unspecified: Secondary | ICD-10-CM | POA: Diagnosis not present

## 2022-03-31 DIAGNOSIS — I5030 Unspecified diastolic (congestive) heart failure: Secondary | ICD-10-CM | POA: Diagnosis not present

## 2022-04-05 DIAGNOSIS — Z95818 Presence of other cardiac implants and grafts: Secondary | ICD-10-CM | POA: Diagnosis not present

## 2022-04-05 DIAGNOSIS — E785 Hyperlipidemia, unspecified: Secondary | ICD-10-CM | POA: Diagnosis not present

## 2022-04-05 DIAGNOSIS — D631 Anemia in chronic kidney disease: Secondary | ICD-10-CM | POA: Diagnosis not present

## 2022-04-05 DIAGNOSIS — I480 Paroxysmal atrial fibrillation: Secondary | ICD-10-CM | POA: Diagnosis not present

## 2022-04-05 DIAGNOSIS — E113492 Type 2 diabetes mellitus with severe nonproliferative diabetic retinopathy without macular edema, left eye: Secondary | ICD-10-CM | POA: Diagnosis not present

## 2022-04-05 DIAGNOSIS — Z7982 Long term (current) use of aspirin: Secondary | ICD-10-CM | POA: Diagnosis not present

## 2022-04-05 DIAGNOSIS — Z8631 Personal history of diabetic foot ulcer: Secondary | ICD-10-CM | POA: Diagnosis not present

## 2022-04-05 DIAGNOSIS — E1151 Type 2 diabetes mellitus with diabetic peripheral angiopathy without gangrene: Secondary | ICD-10-CM | POA: Diagnosis not present

## 2022-04-05 DIAGNOSIS — I5030 Unspecified diastolic (congestive) heart failure: Secondary | ICD-10-CM | POA: Diagnosis not present

## 2022-04-05 DIAGNOSIS — Z794 Long term (current) use of insulin: Secondary | ICD-10-CM | POA: Diagnosis not present

## 2022-04-05 DIAGNOSIS — Z95820 Peripheral vascular angioplasty status with implants and grafts: Secondary | ICD-10-CM | POA: Diagnosis not present

## 2022-04-05 DIAGNOSIS — G40909 Epilepsy, unspecified, not intractable, without status epilepticus: Secondary | ICD-10-CM | POA: Diagnosis not present

## 2022-04-05 DIAGNOSIS — I69154 Hemiplegia and hemiparesis following nontraumatic intracerebral hemorrhage affecting left non-dominant side: Secondary | ICD-10-CM | POA: Diagnosis not present

## 2022-04-05 DIAGNOSIS — I130 Hypertensive heart and chronic kidney disease with heart failure and stage 1 through stage 4 chronic kidney disease, or unspecified chronic kidney disease: Secondary | ICD-10-CM | POA: Diagnosis not present

## 2022-04-05 DIAGNOSIS — I70203 Unspecified atherosclerosis of native arteries of extremities, bilateral legs: Secondary | ICD-10-CM | POA: Diagnosis not present

## 2022-04-05 DIAGNOSIS — Z7902 Long term (current) use of antithrombotics/antiplatelets: Secondary | ICD-10-CM | POA: Diagnosis not present

## 2022-04-05 DIAGNOSIS — Z89432 Acquired absence of left foot: Secondary | ICD-10-CM | POA: Diagnosis not present

## 2022-04-05 DIAGNOSIS — N189 Chronic kidney disease, unspecified: Secondary | ICD-10-CM | POA: Diagnosis not present

## 2022-04-05 DIAGNOSIS — E1122 Type 2 diabetes mellitus with diabetic chronic kidney disease: Secondary | ICD-10-CM | POA: Diagnosis not present

## 2022-04-07 DIAGNOSIS — D631 Anemia in chronic kidney disease: Secondary | ICD-10-CM | POA: Diagnosis not present

## 2022-04-07 DIAGNOSIS — I480 Paroxysmal atrial fibrillation: Secondary | ICD-10-CM | POA: Diagnosis not present

## 2022-04-07 DIAGNOSIS — I130 Hypertensive heart and chronic kidney disease with heart failure and stage 1 through stage 4 chronic kidney disease, or unspecified chronic kidney disease: Secondary | ICD-10-CM | POA: Diagnosis not present

## 2022-04-07 DIAGNOSIS — E113492 Type 2 diabetes mellitus with severe nonproliferative diabetic retinopathy without macular edema, left eye: Secondary | ICD-10-CM | POA: Diagnosis not present

## 2022-04-07 DIAGNOSIS — Z95820 Peripheral vascular angioplasty status with implants and grafts: Secondary | ICD-10-CM | POA: Diagnosis not present

## 2022-04-07 DIAGNOSIS — E1151 Type 2 diabetes mellitus with diabetic peripheral angiopathy without gangrene: Secondary | ICD-10-CM | POA: Diagnosis not present

## 2022-04-07 DIAGNOSIS — E1122 Type 2 diabetes mellitus with diabetic chronic kidney disease: Secondary | ICD-10-CM | POA: Diagnosis not present

## 2022-04-07 DIAGNOSIS — Z89432 Acquired absence of left foot: Secondary | ICD-10-CM | POA: Diagnosis not present

## 2022-04-07 DIAGNOSIS — I611 Nontraumatic intracerebral hemorrhage in hemisphere, cortical: Secondary | ICD-10-CM | POA: Diagnosis not present

## 2022-04-07 DIAGNOSIS — Z8631 Personal history of diabetic foot ulcer: Secondary | ICD-10-CM | POA: Diagnosis not present

## 2022-04-07 DIAGNOSIS — I5030 Unspecified diastolic (congestive) heart failure: Secondary | ICD-10-CM | POA: Diagnosis not present

## 2022-04-07 DIAGNOSIS — N189 Chronic kidney disease, unspecified: Secondary | ICD-10-CM | POA: Diagnosis not present

## 2022-04-07 DIAGNOSIS — I2510 Atherosclerotic heart disease of native coronary artery without angina pectoris: Secondary | ICD-10-CM | POA: Diagnosis not present

## 2022-04-07 DIAGNOSIS — I739 Peripheral vascular disease, unspecified: Secondary | ICD-10-CM | POA: Diagnosis not present

## 2022-04-07 DIAGNOSIS — I10 Essential (primary) hypertension: Secondary | ICD-10-CM | POA: Diagnosis not present

## 2022-04-07 DIAGNOSIS — Z95818 Presence of other cardiac implants and grafts: Secondary | ICD-10-CM | POA: Diagnosis not present

## 2022-04-07 DIAGNOSIS — G40909 Epilepsy, unspecified, not intractable, without status epilepticus: Secondary | ICD-10-CM | POA: Diagnosis not present

## 2022-04-07 DIAGNOSIS — I70203 Unspecified atherosclerosis of native arteries of extremities, bilateral legs: Secondary | ICD-10-CM | POA: Diagnosis not present

## 2022-04-07 DIAGNOSIS — E785 Hyperlipidemia, unspecified: Secondary | ICD-10-CM | POA: Diagnosis not present

## 2022-04-07 DIAGNOSIS — Z7982 Long term (current) use of aspirin: Secondary | ICD-10-CM | POA: Diagnosis not present

## 2022-04-07 DIAGNOSIS — I69154 Hemiplegia and hemiparesis following nontraumatic intracerebral hemorrhage affecting left non-dominant side: Secondary | ICD-10-CM | POA: Diagnosis not present

## 2022-04-07 DIAGNOSIS — Z7902 Long term (current) use of antithrombotics/antiplatelets: Secondary | ICD-10-CM | POA: Diagnosis not present

## 2022-04-07 DIAGNOSIS — Z794 Long term (current) use of insulin: Secondary | ICD-10-CM | POA: Diagnosis not present

## 2022-04-12 DIAGNOSIS — I480 Paroxysmal atrial fibrillation: Secondary | ICD-10-CM | POA: Diagnosis not present

## 2022-04-12 DIAGNOSIS — E785 Hyperlipidemia, unspecified: Secondary | ICD-10-CM | POA: Diagnosis not present

## 2022-04-12 DIAGNOSIS — N189 Chronic kidney disease, unspecified: Secondary | ICD-10-CM | POA: Diagnosis not present

## 2022-04-12 DIAGNOSIS — Z794 Long term (current) use of insulin: Secondary | ICD-10-CM | POA: Diagnosis not present

## 2022-04-12 DIAGNOSIS — I70203 Unspecified atherosclerosis of native arteries of extremities, bilateral legs: Secondary | ICD-10-CM | POA: Diagnosis not present

## 2022-04-12 DIAGNOSIS — Z95818 Presence of other cardiac implants and grafts: Secondary | ICD-10-CM | POA: Diagnosis not present

## 2022-04-12 DIAGNOSIS — I5030 Unspecified diastolic (congestive) heart failure: Secondary | ICD-10-CM | POA: Diagnosis not present

## 2022-04-12 DIAGNOSIS — Z7902 Long term (current) use of antithrombotics/antiplatelets: Secondary | ICD-10-CM | POA: Diagnosis not present

## 2022-04-12 DIAGNOSIS — E113492 Type 2 diabetes mellitus with severe nonproliferative diabetic retinopathy without macular edema, left eye: Secondary | ICD-10-CM | POA: Diagnosis not present

## 2022-04-12 DIAGNOSIS — Z7982 Long term (current) use of aspirin: Secondary | ICD-10-CM | POA: Diagnosis not present

## 2022-04-12 DIAGNOSIS — Z95820 Peripheral vascular angioplasty status with implants and grafts: Secondary | ICD-10-CM | POA: Diagnosis not present

## 2022-04-12 DIAGNOSIS — Z8631 Personal history of diabetic foot ulcer: Secondary | ICD-10-CM | POA: Diagnosis not present

## 2022-04-12 DIAGNOSIS — E1122 Type 2 diabetes mellitus with diabetic chronic kidney disease: Secondary | ICD-10-CM | POA: Diagnosis not present

## 2022-04-12 DIAGNOSIS — I130 Hypertensive heart and chronic kidney disease with heart failure and stage 1 through stage 4 chronic kidney disease, or unspecified chronic kidney disease: Secondary | ICD-10-CM | POA: Diagnosis not present

## 2022-04-12 DIAGNOSIS — Z89432 Acquired absence of left foot: Secondary | ICD-10-CM | POA: Diagnosis not present

## 2022-04-12 DIAGNOSIS — D631 Anemia in chronic kidney disease: Secondary | ICD-10-CM | POA: Diagnosis not present

## 2022-04-12 DIAGNOSIS — I69154 Hemiplegia and hemiparesis following nontraumatic intracerebral hemorrhage affecting left non-dominant side: Secondary | ICD-10-CM | POA: Diagnosis not present

## 2022-04-12 DIAGNOSIS — E1151 Type 2 diabetes mellitus with diabetic peripheral angiopathy without gangrene: Secondary | ICD-10-CM | POA: Diagnosis not present

## 2022-04-12 DIAGNOSIS — G40909 Epilepsy, unspecified, not intractable, without status epilepticus: Secondary | ICD-10-CM | POA: Diagnosis not present

## 2022-04-14 DIAGNOSIS — Z95818 Presence of other cardiac implants and grafts: Secondary | ICD-10-CM | POA: Diagnosis not present

## 2022-04-14 DIAGNOSIS — Z794 Long term (current) use of insulin: Secondary | ICD-10-CM | POA: Diagnosis not present

## 2022-04-14 DIAGNOSIS — E113492 Type 2 diabetes mellitus with severe nonproliferative diabetic retinopathy without macular edema, left eye: Secondary | ICD-10-CM | POA: Diagnosis not present

## 2022-04-14 DIAGNOSIS — I69154 Hemiplegia and hemiparesis following nontraumatic intracerebral hemorrhage affecting left non-dominant side: Secondary | ICD-10-CM | POA: Diagnosis not present

## 2022-04-14 DIAGNOSIS — E785 Hyperlipidemia, unspecified: Secondary | ICD-10-CM | POA: Diagnosis not present

## 2022-04-14 DIAGNOSIS — Z7982 Long term (current) use of aspirin: Secondary | ICD-10-CM | POA: Diagnosis not present

## 2022-04-14 DIAGNOSIS — I130 Hypertensive heart and chronic kidney disease with heart failure and stage 1 through stage 4 chronic kidney disease, or unspecified chronic kidney disease: Secondary | ICD-10-CM | POA: Diagnosis not present

## 2022-04-14 DIAGNOSIS — G40909 Epilepsy, unspecified, not intractable, without status epilepticus: Secondary | ICD-10-CM | POA: Diagnosis not present

## 2022-04-14 DIAGNOSIS — N189 Chronic kidney disease, unspecified: Secondary | ICD-10-CM | POA: Diagnosis not present

## 2022-04-14 DIAGNOSIS — E1122 Type 2 diabetes mellitus with diabetic chronic kidney disease: Secondary | ICD-10-CM | POA: Diagnosis not present

## 2022-04-14 DIAGNOSIS — Z8631 Personal history of diabetic foot ulcer: Secondary | ICD-10-CM | POA: Diagnosis not present

## 2022-04-14 DIAGNOSIS — E1151 Type 2 diabetes mellitus with diabetic peripheral angiopathy without gangrene: Secondary | ICD-10-CM | POA: Diagnosis not present

## 2022-04-14 DIAGNOSIS — I480 Paroxysmal atrial fibrillation: Secondary | ICD-10-CM | POA: Diagnosis not present

## 2022-04-14 DIAGNOSIS — Z7902 Long term (current) use of antithrombotics/antiplatelets: Secondary | ICD-10-CM | POA: Diagnosis not present

## 2022-04-14 DIAGNOSIS — I5030 Unspecified diastolic (congestive) heart failure: Secondary | ICD-10-CM | POA: Diagnosis not present

## 2022-04-14 DIAGNOSIS — Z89432 Acquired absence of left foot: Secondary | ICD-10-CM | POA: Diagnosis not present

## 2022-04-14 DIAGNOSIS — Z95820 Peripheral vascular angioplasty status with implants and grafts: Secondary | ICD-10-CM | POA: Diagnosis not present

## 2022-04-14 DIAGNOSIS — I70203 Unspecified atherosclerosis of native arteries of extremities, bilateral legs: Secondary | ICD-10-CM | POA: Diagnosis not present

## 2022-04-14 DIAGNOSIS — D631 Anemia in chronic kidney disease: Secondary | ICD-10-CM | POA: Diagnosis not present

## 2022-04-27 DIAGNOSIS — H2513 Age-related nuclear cataract, bilateral: Secondary | ICD-10-CM | POA: Diagnosis not present

## 2022-04-27 DIAGNOSIS — E113492 Type 2 diabetes mellitus with severe nonproliferative diabetic retinopathy without macular edema, left eye: Secondary | ICD-10-CM | POA: Diagnosis not present

## 2022-04-27 DIAGNOSIS — E113591 Type 2 diabetes mellitus with proliferative diabetic retinopathy without macular edema, right eye: Secondary | ICD-10-CM | POA: Diagnosis not present

## 2022-05-03 ENCOUNTER — Telehealth: Payer: Self-pay | Admitting: *Deleted

## 2022-05-04 DIAGNOSIS — I611 Nontraumatic intracerebral hemorrhage in hemisphere, cortical: Secondary | ICD-10-CM | POA: Diagnosis not present

## 2022-05-04 DIAGNOSIS — I618 Other nontraumatic intracerebral hemorrhage: Secondary | ICD-10-CM | POA: Diagnosis not present

## 2022-05-16 ENCOUNTER — Other Ambulatory Visit: Payer: Self-pay

## 2022-05-16 ENCOUNTER — Encounter: Payer: Self-pay | Admitting: *Deleted

## 2022-05-16 DIAGNOSIS — N189 Chronic kidney disease, unspecified: Secondary | ICD-10-CM | POA: Diagnosis not present

## 2022-05-16 DIAGNOSIS — I4891 Unspecified atrial fibrillation: Secondary | ICD-10-CM | POA: Diagnosis not present

## 2022-05-16 DIAGNOSIS — R109 Unspecified abdominal pain: Secondary | ICD-10-CM | POA: Diagnosis not present

## 2022-05-16 DIAGNOSIS — Z743 Need for continuous supervision: Secondary | ICD-10-CM | POA: Diagnosis not present

## 2022-05-16 DIAGNOSIS — I10 Essential (primary) hypertension: Secondary | ICD-10-CM | POA: Diagnosis not present

## 2022-05-16 DIAGNOSIS — N2889 Other specified disorders of kidney and ureter: Secondary | ICD-10-CM | POA: Diagnosis not present

## 2022-05-16 DIAGNOSIS — I700 Atherosclerosis of aorta: Secondary | ICD-10-CM | POA: Diagnosis not present

## 2022-05-16 DIAGNOSIS — I129 Hypertensive chronic kidney disease with stage 1 through stage 4 chronic kidney disease, or unspecified chronic kidney disease: Secondary | ICD-10-CM | POA: Insufficient documentation

## 2022-05-16 DIAGNOSIS — N3000 Acute cystitis without hematuria: Secondary | ICD-10-CM | POA: Diagnosis not present

## 2022-05-16 LAB — URINALYSIS, ROUTINE W REFLEX MICROSCOPIC
Bilirubin Urine: NEGATIVE
Glucose, UA: NEGATIVE mg/dL
Hgb urine dipstick: NEGATIVE
Ketones, ur: NEGATIVE mg/dL
Nitrite: NEGATIVE
Protein, ur: 100 mg/dL — AB
Specific Gravity, Urine: 1.009 (ref 1.005–1.030)
WBC, UA: >50 WBC/hpf — ABNORMAL HIGH (ref 0–5)
pH: 5 (ref 5.0–8.0)

## 2022-05-16 LAB — COMPREHENSIVE METABOLIC PANEL
ALT: 20 U/L (ref 0–44)
AST: 22 U/L (ref 15–41)
Albumin: 4.6 g/dL (ref 3.5–5.0)
Alkaline Phosphatase: 95 U/L (ref 38–126)
Anion gap: 9 (ref 5–15)
BUN: 59 mg/dL — ABNORMAL HIGH (ref 8–23)
CO2: 19 mmol/L — ABNORMAL LOW (ref 22–32)
Calcium: 9.2 mg/dL (ref 8.9–10.3)
Chloride: 113 mmol/L — ABNORMAL HIGH (ref 98–111)
Creatinine, Ser: 2.41 mg/dL — ABNORMAL HIGH (ref 0.61–1.24)
GFR, Estimated: 28 mL/min — ABNORMAL LOW (ref >60)
Glucose, Bld: 171 mg/dL — ABNORMAL HIGH (ref 70–99)
Potassium: 5 mmol/L (ref 3.5–5.1)
Sodium: 141 mmol/L (ref 135–145)
Total Bilirubin: 0.7 mg/dL (ref 0.3–1.2)
Total Protein: 8.3 g/dL — ABNORMAL HIGH (ref 6.5–8.1)

## 2022-05-16 LAB — CBC
HCT: 44.1 % (ref 39.0–52.0)
Hemoglobin: 13.7 g/dL (ref 13.0–17.0)
MCH: 27.2 pg (ref 26.0–34.0)
MCHC: 31.1 g/dL (ref 30.0–36.0)
MCV: 87.7 fL (ref 80.0–100.0)
Platelets: 187 10*3/uL (ref 150–400)
RBC: 5.03 MIL/uL (ref 4.22–5.81)
RDW: 13.8 % (ref 11.5–15.5)
WBC: 6.1 10*3/uL (ref 4.0–10.5)
nRBC: 0 % (ref 0.0–0.2)

## 2022-05-16 LAB — LIPASE, BLOOD: Lipase: 60 U/L — ABNORMAL HIGH (ref 11–51)

## 2022-05-16 NOTE — ED Triage Notes (Signed)
Pt brought in via ems from home with right flank pain.  No n/v/d  intermittent pain.  Pt taking motrin without relief  pt alert  speech clear.

## 2022-05-16 NOTE — ED Notes (Signed)
Pt unable to void at this time. 

## 2022-05-17 ENCOUNTER — Emergency Department: Payer: Medicare HMO

## 2022-05-17 ENCOUNTER — Emergency Department
Admission: EM | Admit: 2022-05-17 | Discharge: 2022-05-17 | Disposition: A | Discharge status: Home / Self Care | Payer: Medicare HMO | Attending: Emergency Medicine | Admitting: Emergency Medicine

## 2022-05-17 DIAGNOSIS — N2889 Other specified disorders of kidney and ureter: Secondary | ICD-10-CM | POA: Diagnosis not present

## 2022-05-17 DIAGNOSIS — R109 Unspecified abdominal pain: Secondary | ICD-10-CM | POA: Diagnosis not present

## 2022-05-17 DIAGNOSIS — N3000 Acute cystitis without hematuria: Secondary | ICD-10-CM

## 2022-05-17 DIAGNOSIS — I700 Atherosclerosis of aorta: Secondary | ICD-10-CM | POA: Diagnosis not present

## 2022-05-17 MED ORDER — CEPHALEXIN 250 MG PO CAPS: 250.0000 mg | ORAL_CAPSULE | Freq: Two times a day (BID) | ORAL | 0 refills | Status: AC

## 2022-05-17 MED ORDER — SODIUM CHLORIDE 0.9 % IV SOLN
1.0000 g | Freq: Once | INTRAVENOUS | Status: AC
  Administered 2022-05-17: 1 g via INTRAVENOUS

## 2022-05-17 MED ORDER — SODIUM CHLORIDE 0.9 % IV BOLUS
1000.0000 mL | Freq: Once | INTRAVENOUS | Status: AC
  Administered 2022-05-17: 1000 mL via INTRAVENOUS

## 2022-05-17 NOTE — ED Notes (Signed)
Pt verbalized understanding of discharge instructions. Opportunity for questions provided.  

## 2022-05-17 NOTE — Discharge Instructions (Signed)
Please take antibiotics twice daily for the next 7 days to treat a UTI/bladder infection.  If your symptoms worsen despite this, please return to the ED.

## 2022-05-17 NOTE — ED Provider Notes (Signed)
8:44 AM Assumed care for off going team.   Blood pressure (!) 164/81, pulse 70, temperature 97.9 F (36.6 C), temperature source Oral, resp. rate 16, height 6\' 3"  (1.905 m), weight 99 kg, SpO2 93 %.  See their HPI for full report but in brief pending fluids and antibiotics for cystitis  Reevaluated patient is resting comfortably in bed.  Repeat vital signs stable.  Patient and family are comfortable with discharge home with antibiotics for UTI        , MD 05/17/22 [phone]

## 2022-05-17 NOTE — ED Provider Notes (Addendum)
[facility] Provider Note    Event Date/Time   First MD Initiated Contact with Patient 05/17/22 [phone]     (approximate)   History   Flank Pain   HPI  Anthony Fox is a 71 y.o. male who presents to the ED for evaluation of Flank Pain   I reviewed cardiology clinic visit from 6/15.  History of atrial fibrillation s/p Watchman device placement.  Eliquis recently stopped.  On Plavix for PAD.   Patient presents to the ED with his wife for evaluation of right-sided abdominal pain, urinary frequency and dribbling urine since Friday.  Reports feeling subjective chills and sweats alongside this.  No emesis, diarrhea, hematuria or dysuria.  No trauma, falls or injuries.  Ambulatory with a cane at home.  Denies any weakness or sensation changes to the extremities.  Physical Exam   Triage Vital Signs: ED Triage Vitals [05/16/22 2118]  Enc Vitals Group     BP (!) 167/71     Pulse Rate 61     Resp 20     Temp 98.9 F (37.2 C)     Temp Source Oral     SpO2 93 %     Weight 218 lb 4.1 oz (99 kg)     Height 6\' 3"  (1.905 m)     Head Circumference      Peak Flow      Pain Score 7     Pain Loc      Pain Edu?      Excl. in GC?     Most recent vital signs: Vitals:   05/17/22 0600 05/17/22 0700  BP: (!) 168/89 (!) 143/74  Pulse: 63 73  Resp:  16  Temp:    SpO2: 95% 92%    General: Awake, no distress.  CV:  Good peripheral perfusion.  Resp:  Normal effort.  Abd:  No distention.  Poorly localizing suprapubic and right-sided abdominal tenderness without peritoneal features.  No guarding.  Benign upper abdomen. MSK:  No deformity noted.  Neuro:  No focal deficits appreciated. Other:     ED Results / Procedures / Treatments   Labs (all labs ordered are listed, but only abnormal results are displayed) Labs Reviewed  LIPASE, BLOOD - Abnormal; Notable for the following components:      Result Value   Lipase 60 (*)    All other components within normal  limits  COMPREHENSIVE METABOLIC PANEL - Abnormal; Notable for the following components:   Chloride 113 (*)    CO2 19 (*)    Glucose, Bld 171 (*)    BUN 59 (*)    Creatinine, Ser 2.41 (*)    Total Protein 8.3 (*)    GFR, Estimated 28 (*)    All other components within normal limits  URINALYSIS, ROUTINE W REFLEX MICROSCOPIC - Abnormal; Notable for the following components:   Color, Urine STRAW (*)    APPearance HAZY (*)    Protein, ur 100 (*)    Leukocytes,Ua LARGE (*)    WBC, UA >50 (*)    Bacteria, UA RARE (*)    All other components within normal limits  URINE CULTURE  CBC    EKG   RADIOLOGY CT renal study interpreted by me without evidence of ureteral stone.  Urinary bladder wall thickening is noted  Official radiology report(s): CT Renal Stone Study  Result Date: 05/17/2022 CLINICAL DATA:  Right flank pain EXAM: CT ABDOMEN AND PELVIS WITHOUT CONTRAST TECHNIQUE: Multidetector CT imaging  of the abdomen and pelvis was performed following the standard protocol without IV contrast. RADIATION DOSE REDUCTION: This exam was performed according to the departmental dose-optimization program which includes automated exposure control, adjustment of the mA and/or kV according to patient size and/or use of iterative reconstruction technique. COMPARISON:  None Available. FINDINGS: Motion degraded images. Lower chest: Lung bases are clear. Hepatobiliary: Unenhanced liver is unremarkable. Gallbladder is unremarkable. No intrahepatic or extrahepatic ductal dilatation. Pancreas: Within normal limits. Spleen: Grossly unremarkable. Adrenals/Urinary Tract: 16 mm left adrenal nodule (series 2/image 32) with mild low-density thickening of the right adrenal gland, likely reflecting benign adrenal adenomas. Extensive bilateral renal vascular calcifications. Kidneys are otherwise within normal limits. No definite renal calculi or hydronephrosis. Thick-walled bladder, possibly reflecting chronic bladder outlet  obstruction. Stomach/Bowel: Stomach is within normal limits. No evidence of bowel obstruction. Normal appendix (series 2/image 71). No colonic wall thickening or inflammatory changes. Vascular/Lymphatic: No evidence of abdominal aortic aneurysm. Atherosclerotic calcifications of the abdominal aorta and branch vessels. No suspicious abdominopelvic lymphadenopathy. Reproductive: Prostatomegaly, with enlargement of the central gland, suggesting BPH. Other: No abdominopelvic ascites. Musculoskeletal: Degenerative changes of the visualized thoracolumbar spine. IMPRESSION: Motion degraded images. Suspected BPH with chronic bladder outlet obstruction. Additional ancillary findings as above. Electronically Signed   By: Charline Bills M.D.   On: 05/17/2022 03:49    PROCEDURES and INTERVENTIONS:  Procedures  Medications  sodium chloride 0.9 % bolus 1,000 mL (has no administration in time range)  cefTRIAXone (ROCEPHIN) 1 g in sodium chloride 0.9 % 100 mL IVPB (has no administration in time range)     IMPRESSION / MDM / ASSESSMENT AND PLAN / ED COURSE  I reviewed the triage vital signs and the nursing notes.  Differential diagnosis includes, but is not limited to, cystitis, ureterolithiasis, pyelonephritis, appendicitis, diverticulitis  {Patient presents with symptoms of an acute illness or injury that is potentially life-threatening.  71 year old male presents to the ED with abdominal discomfort associated with urinary changes concerning for acute cystitis.  Persistently mildly hypertensive without signs of sepsis or SIRS criteria.  Normal CBC.  Lipase just marginally elevated and may not be clinically relevant.  Metabolic panel with CKD around baseline, but acutely a non anion gap metabolic acidosis.  Urine with infectious features and will be sent for culture.  Empirically started on ceftriaxone.  We will have our nursing staff perform a bladder scan to ensure no signs of urinary retention to  necessitate Foley catheter placement. If his symptoms remain controlled, he may be suitable for outpatient management with close PCP follow-up and a course of Keflex. Clinical Course as of 05/17/22 0730  Tue May 17, 2022  0730 Reassessed.  Patient reports feeling well.  Nurse was having trouble getting an IV.  We discussed IV fluids, antibiotics and possibly going home and he is agreeable with this.  We discussed close return precautions.  Patient signed out to oncoming provider to follow-up on this plan of care. [DS]    Clinical Course User Index [DS] Delton Prairie, MD     FINAL CLINICAL IMPRESSION(S) / ED DIAGNOSES   Final diagnoses:  Acute cystitis without hematuria     Rx / DC Orders   ED Discharge Orders          Ordered    cephALEXin (KEFLEX) 250 MG capsule  2 times daily        05/17/22 3976             Note:  This document was prepared  using Conservation officer, historic buildings and may include unintentional dictation errors.   Delton Prairie, MD 05/17/22 3825    Delton Prairie, MD 05/17/22 0730

## 2022-05-18 LAB — URINE CULTURE: Culture: NO GROWTH

## 2022-05-31 DIAGNOSIS — R799 Abnormal finding of blood chemistry, unspecified: Secondary | ICD-10-CM | POA: Diagnosis not present

## 2022-05-31 DIAGNOSIS — I480 Paroxysmal atrial fibrillation: Secondary | ICD-10-CM | POA: Diagnosis not present

## 2022-05-31 DIAGNOSIS — Z89439 Acquired absence of unspecified foot: Secondary | ICD-10-CM | POA: Diagnosis not present

## 2022-05-31 DIAGNOSIS — Z8673 Personal history of transient ischemic attack (TIA), and cerebral infarction without residual deficits: Secondary | ICD-10-CM | POA: Diagnosis not present

## 2022-05-31 DIAGNOSIS — I5030 Unspecified diastolic (congestive) heart failure: Secondary | ICD-10-CM | POA: Diagnosis not present

## 2022-05-31 DIAGNOSIS — N183 Chronic kidney disease, stage 3 unspecified: Secondary | ICD-10-CM | POA: Diagnosis not present

## 2022-05-31 DIAGNOSIS — E1169 Type 2 diabetes mellitus with other specified complication: Secondary | ICD-10-CM | POA: Diagnosis not present

## 2022-05-31 DIAGNOSIS — I739 Peripheral vascular disease, unspecified: Secondary | ICD-10-CM | POA: Diagnosis not present

## 2022-05-31 DIAGNOSIS — Z794 Long term (current) use of insulin: Secondary | ICD-10-CM | POA: Diagnosis not present

## 2022-05-31 DIAGNOSIS — I10 Essential (primary) hypertension: Secondary | ICD-10-CM | POA: Diagnosis not present

## 2022-05-31 DIAGNOSIS — I618 Other nontraumatic intracerebral hemorrhage: Secondary | ICD-10-CM | POA: Diagnosis not present

## 2022-05-31 DIAGNOSIS — E1122 Type 2 diabetes mellitus with diabetic chronic kidney disease: Secondary | ICD-10-CM | POA: Diagnosis not present

## 2022-05-31 DIAGNOSIS — I611 Nontraumatic intracerebral hemorrhage in hemisphere, cortical: Secondary | ICD-10-CM | POA: Diagnosis not present

## 2022-05-31 DIAGNOSIS — E875 Hyperkalemia: Secondary | ICD-10-CM | POA: Diagnosis not present

## 2022-06-08 DIAGNOSIS — I10 Essential (primary) hypertension: Secondary | ICD-10-CM | POA: Diagnosis not present

## 2022-06-08 DIAGNOSIS — N1832 Chronic kidney disease, stage 3b: Secondary | ICD-10-CM | POA: Diagnosis not present

## 2022-06-08 DIAGNOSIS — E875 Hyperkalemia: Secondary | ICD-10-CM | POA: Diagnosis not present

## 2022-06-15 DIAGNOSIS — N189 Chronic kidney disease, unspecified: Secondary | ICD-10-CM | POA: Diagnosis not present

## 2022-06-15 DIAGNOSIS — Z862 Personal history of diseases of the blood and blood-forming organs and certain disorders involving the immune mechanism: Secondary | ICD-10-CM | POA: Diagnosis not present

## 2022-06-15 DIAGNOSIS — E875 Hyperkalemia: Secondary | ICD-10-CM | POA: Diagnosis not present

## 2022-06-15 DIAGNOSIS — D509 Iron deficiency anemia, unspecified: Secondary | ICD-10-CM | POA: Diagnosis not present

## 2022-06-15 DIAGNOSIS — E1169 Type 2 diabetes mellitus with other specified complication: Secondary | ICD-10-CM | POA: Diagnosis not present

## 2022-06-15 DIAGNOSIS — I10 Essential (primary) hypertension: Secondary | ICD-10-CM | POA: Diagnosis not present

## 2022-06-15 DIAGNOSIS — R531 Weakness: Secondary | ICD-10-CM | POA: Diagnosis not present

## 2022-06-15 DIAGNOSIS — Z794 Long term (current) use of insulin: Secondary | ICD-10-CM | POA: Diagnosis not present

## 2022-06-15 DIAGNOSIS — N1832 Chronic kidney disease, stage 3b: Secondary | ICD-10-CM | POA: Diagnosis not present

## 2022-06-30 DIAGNOSIS — Z794 Long term (current) use of insulin: Secondary | ICD-10-CM | POA: Diagnosis not present

## 2022-06-30 DIAGNOSIS — E1122 Type 2 diabetes mellitus with diabetic chronic kidney disease: Secondary | ICD-10-CM | POA: Diagnosis not present

## 2022-06-30 DIAGNOSIS — G40909 Epilepsy, unspecified, not intractable, without status epilepticus: Secondary | ICD-10-CM | POA: Diagnosis not present

## 2022-06-30 DIAGNOSIS — I611 Nontraumatic intracerebral hemorrhage in hemisphere, cortical: Secondary | ICD-10-CM | POA: Diagnosis not present

## 2022-06-30 DIAGNOSIS — E1169 Type 2 diabetes mellitus with other specified complication: Secondary | ICD-10-CM | POA: Diagnosis not present

## 2022-06-30 DIAGNOSIS — N183 Chronic kidney disease, stage 3 unspecified: Secondary | ICD-10-CM | POA: Diagnosis not present

## 2022-08-04 DIAGNOSIS — I509 Heart failure, unspecified: Secondary | ICD-10-CM | POA: Diagnosis not present

## 2022-08-26 DIAGNOSIS — R600 Localized edema: Secondary | ICD-10-CM | POA: Diagnosis not present

## 2022-08-26 DIAGNOSIS — I10 Essential (primary) hypertension: Secondary | ICD-10-CM | POA: Diagnosis not present

## 2022-08-26 DIAGNOSIS — I5030 Unspecified diastolic (congestive) heart failure: Secondary | ICD-10-CM | POA: Diagnosis not present

## 2022-08-26 DIAGNOSIS — I509 Heart failure, unspecified: Secondary | ICD-10-CM | POA: Diagnosis not present

## 2022-08-26 DIAGNOSIS — Z23 Encounter for immunization: Secondary | ICD-10-CM | POA: Diagnosis not present

## 2022-08-26 DIAGNOSIS — I110 Hypertensive heart disease with heart failure: Secondary | ICD-10-CM | POA: Diagnosis not present

## 2022-08-26 DIAGNOSIS — N183 Chronic kidney disease, stage 3 unspecified: Secondary | ICD-10-CM | POA: Diagnosis not present

## 2022-08-26 DIAGNOSIS — E1169 Type 2 diabetes mellitus with other specified complication: Secondary | ICD-10-CM | POA: Diagnosis not present

## 2022-08-26 DIAGNOSIS — E875 Hyperkalemia: Secondary | ICD-10-CM | POA: Diagnosis not present

## 2022-08-26 DIAGNOSIS — I480 Paroxysmal atrial fibrillation: Secondary | ICD-10-CM | POA: Diagnosis not present

## 2022-08-26 DIAGNOSIS — Z794 Long term (current) use of insulin: Secondary | ICD-10-CM | POA: Diagnosis not present

## 2022-08-26 DIAGNOSIS — E1122 Type 2 diabetes mellitus with diabetic chronic kidney disease: Secondary | ICD-10-CM | POA: Diagnosis not present

## 2022-09-07 DIAGNOSIS — E119 Type 2 diabetes mellitus without complications: Secondary | ICD-10-CM | POA: Diagnosis not present

## 2022-09-07 DIAGNOSIS — I509 Heart failure, unspecified: Secondary | ICD-10-CM | POA: Diagnosis not present

## 2022-09-07 DIAGNOSIS — I110 Hypertensive heart disease with heart failure: Secondary | ICD-10-CM | POA: Diagnosis not present

## 2022-09-07 DIAGNOSIS — D649 Anemia, unspecified: Secondary | ICD-10-CM | POA: Diagnosis not present

## 2022-09-07 DIAGNOSIS — E785 Hyperlipidemia, unspecified: Secondary | ICD-10-CM | POA: Diagnosis not present

## 2022-09-07 DIAGNOSIS — I480 Paroxysmal atrial fibrillation: Secondary | ICD-10-CM | POA: Diagnosis not present

## 2022-09-07 DIAGNOSIS — Z7902 Long term (current) use of antithrombotics/antiplatelets: Secondary | ICD-10-CM | POA: Diagnosis not present

## 2022-09-07 DIAGNOSIS — Z7982 Long term (current) use of aspirin: Secondary | ICD-10-CM | POA: Diagnosis not present

## 2022-09-07 DIAGNOSIS — Z794 Long term (current) use of insulin: Secondary | ICD-10-CM | POA: Diagnosis not present

## 2022-09-07 DIAGNOSIS — I4892 Unspecified atrial flutter: Secondary | ICD-10-CM | POA: Diagnosis not present

## 2022-09-07 DIAGNOSIS — I5033 Acute on chronic diastolic (congestive) heart failure: Secondary | ICD-10-CM | POA: Diagnosis not present

## 2022-09-07 DIAGNOSIS — I5030 Unspecified diastolic (congestive) heart failure: Secondary | ICD-10-CM | POA: Diagnosis not present

## 2022-09-11 DIAGNOSIS — I480 Paroxysmal atrial fibrillation: Secondary | ICD-10-CM | POA: Diagnosis not present

## 2022-09-14 DIAGNOSIS — E1169 Type 2 diabetes mellitus with other specified complication: Secondary | ICD-10-CM | POA: Diagnosis not present

## 2022-09-14 DIAGNOSIS — I5030 Unspecified diastolic (congestive) heart failure: Secondary | ICD-10-CM | POA: Diagnosis not present

## 2022-09-14 DIAGNOSIS — Z794 Long term (current) use of insulin: Secondary | ICD-10-CM | POA: Diagnosis not present

## 2022-09-29 DIAGNOSIS — I10 Essential (primary) hypertension: Secondary | ICD-10-CM | POA: Diagnosis not present

## 2022-09-29 DIAGNOSIS — I5189 Other ill-defined heart diseases: Secondary | ICD-10-CM | POA: Diagnosis not present

## 2022-09-29 DIAGNOSIS — E875 Hyperkalemia: Secondary | ICD-10-CM | POA: Diagnosis not present

## 2022-09-29 DIAGNOSIS — I5030 Unspecified diastolic (congestive) heart failure: Secondary | ICD-10-CM | POA: Diagnosis not present

## 2022-09-29 DIAGNOSIS — I739 Peripheral vascular disease, unspecified: Secondary | ICD-10-CM | POA: Diagnosis not present

## 2022-09-29 DIAGNOSIS — I480 Paroxysmal atrial fibrillation: Secondary | ICD-10-CM | POA: Diagnosis not present

## 2022-09-29 DIAGNOSIS — Z87891 Personal history of nicotine dependence: Secondary | ICD-10-CM | POA: Diagnosis not present

## 2022-09-29 DIAGNOSIS — E1169 Type 2 diabetes mellitus with other specified complication: Secondary | ICD-10-CM | POA: Diagnosis not present

## 2022-09-29 DIAGNOSIS — E1122 Type 2 diabetes mellitus with diabetic chronic kidney disease: Secondary | ICD-10-CM | POA: Diagnosis not present

## 2022-09-29 DIAGNOSIS — N183 Chronic kidney disease, stage 3 unspecified: Secondary | ICD-10-CM | POA: Diagnosis not present

## 2022-09-29 DIAGNOSIS — Z794 Long term (current) use of insulin: Secondary | ICD-10-CM | POA: Diagnosis not present

## 2022-10-03 DIAGNOSIS — I5030 Unspecified diastolic (congestive) heart failure: Secondary | ICD-10-CM | POA: Diagnosis not present

## 2022-10-03 DIAGNOSIS — E279 Disorder of adrenal gland, unspecified: Secondary | ICD-10-CM | POA: Diagnosis not present

## 2022-10-06 IMAGING — US US EXTREM LOW VENOUS
1 series · 14 of 24 positions shown · non-contrast
Comparison: None.

CLINICAL DATA: 69-year-old male with leg swelling.

EXAM:
Bilateral LOWER EXTREMITY VENOUS DOPPLER ULTRASOUND
TECHNIQUE: Gray-scale sonography with compression, as well as color and duplex
ultrasound, were performed to evaluate the deep venous system(s)
from the level of the common femoral vein through the popliteal and
proximal calf veins.

[Series 1: us venous img lower bilat (dvt) · portal-venous · 14 of 65 slices shown]
[im 1/65]
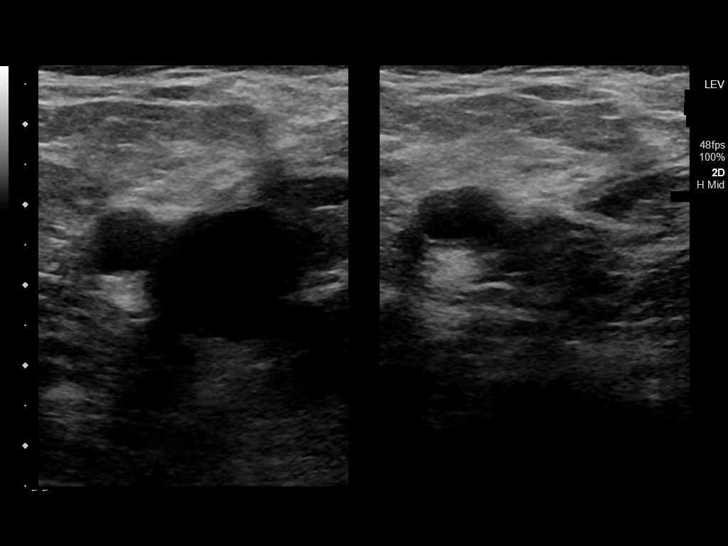
[im 6/65]
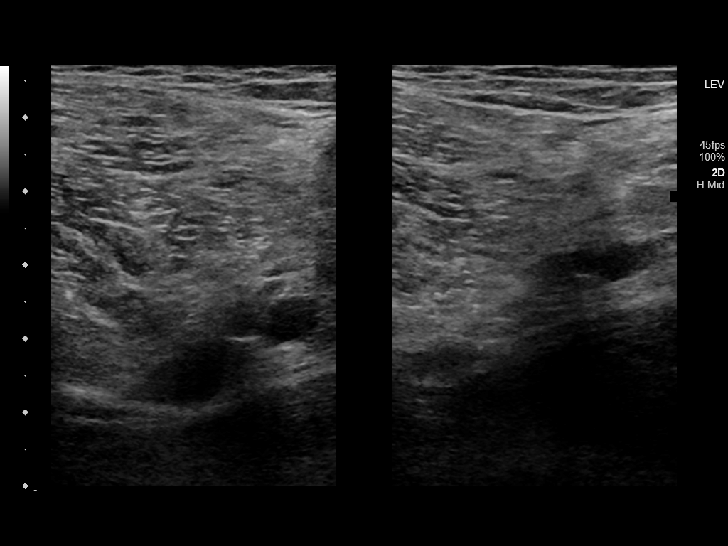
[im 12/65]
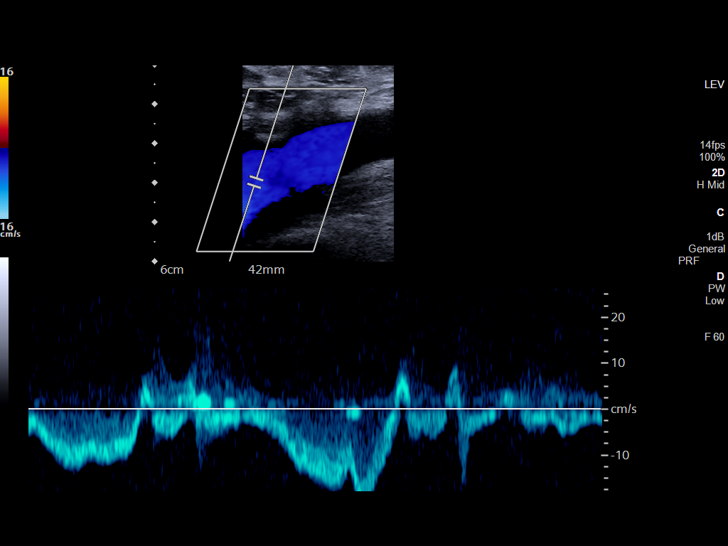
[im 17/65]
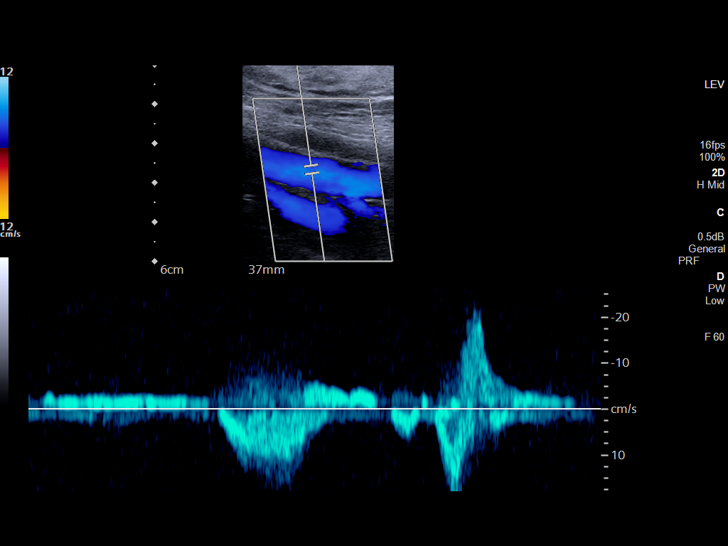
[im 20/65]
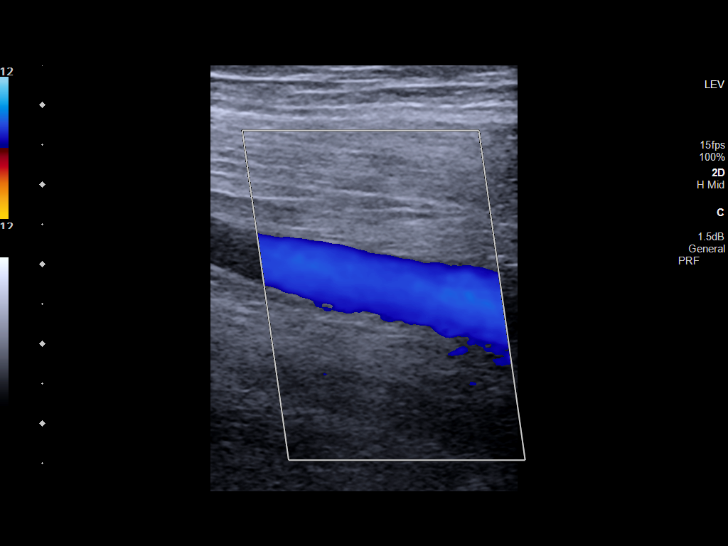
[im 26/65]
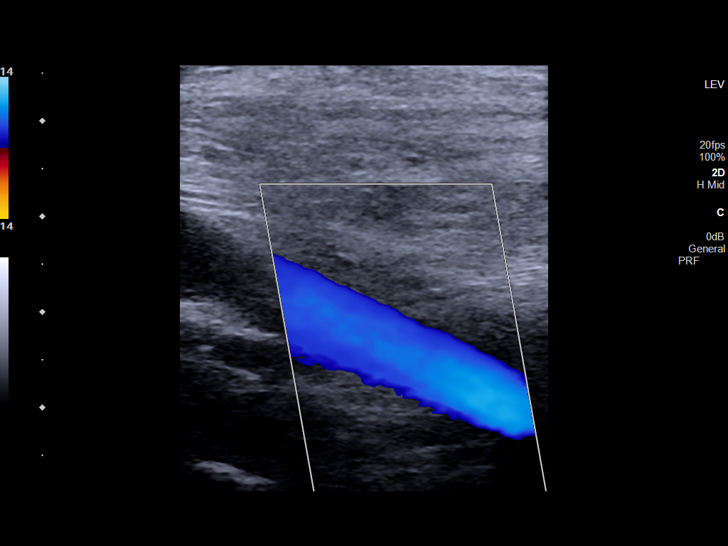
[im 31/65]
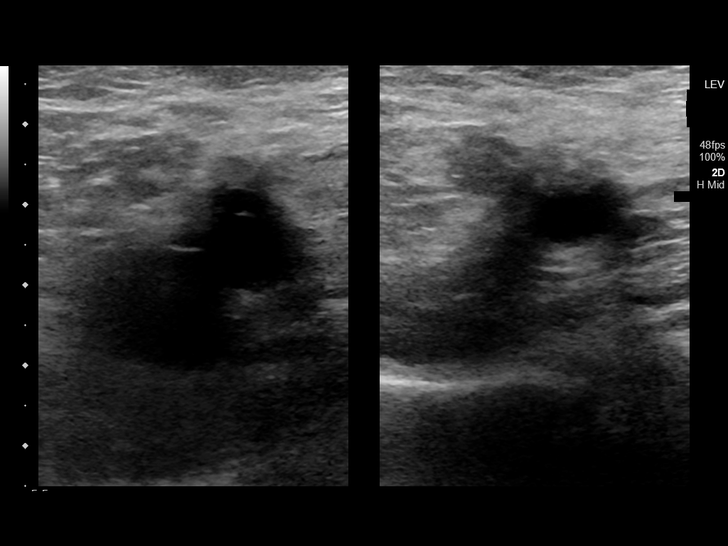
[im 34/65]
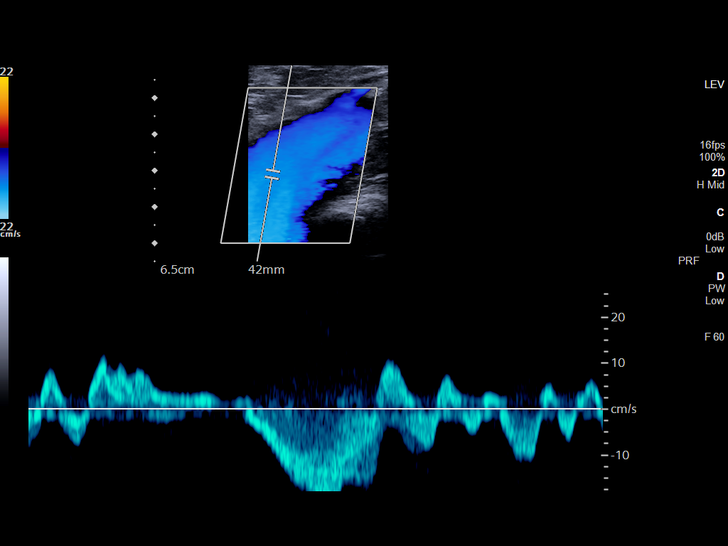
[im 39/65]
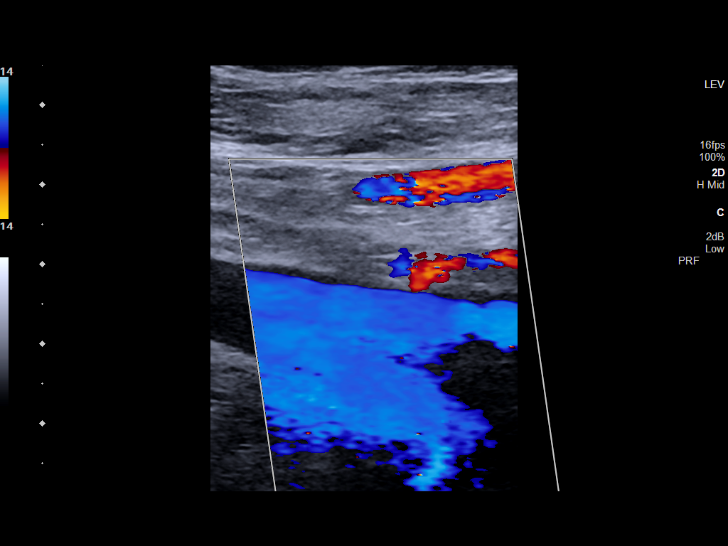
[im 45/65]
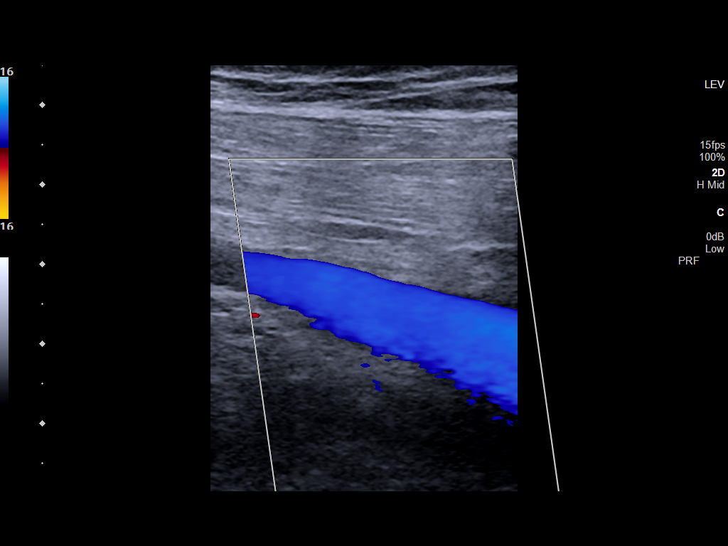
[im 51/65]
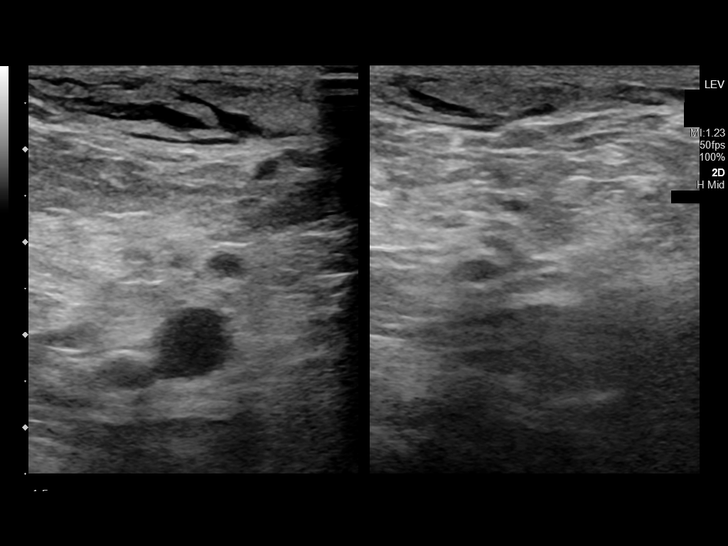
[im 53/65]
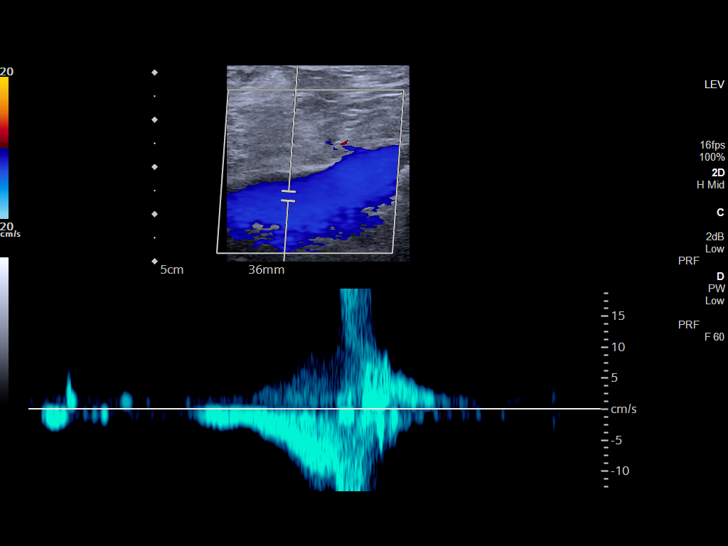
[im 59/65]
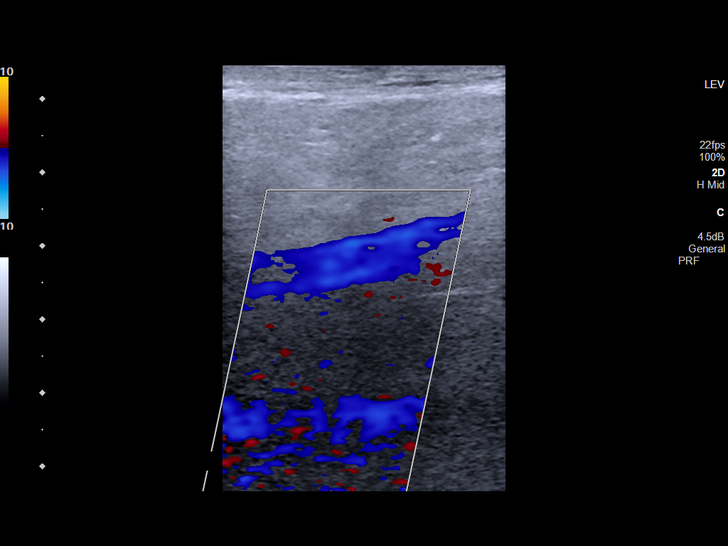
[im 65/65]
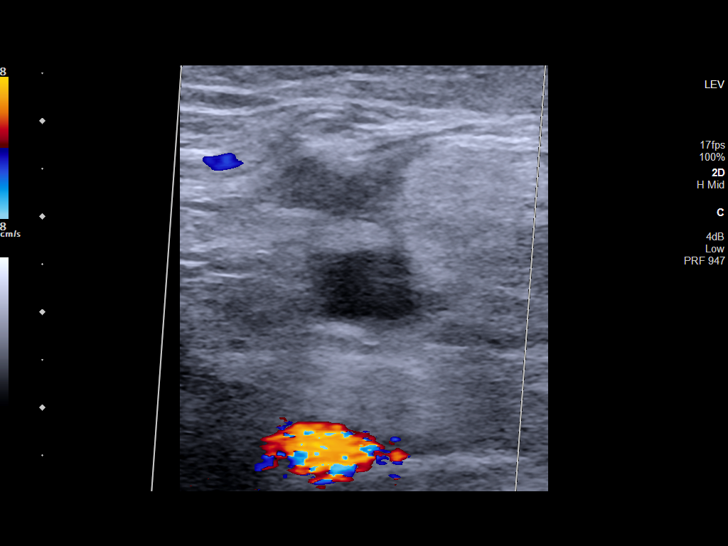

[14 of 24 positions shown; findings below may reference images not displayed]

FINDINGS: VENOUS

Normal compressibility of the common femoral, superficial femoral,
and popliteal veins, as well as the visualized calf veins.
Visualized portions of profunda femoral vein and great saphenous
vein unremarkable. No filling defects to suggest DVT on grayscale or
color Doppler imaging. Doppler waveforms show normal direction of
venous flow, normal respiratory plasticity and response to
augmentation.

Limited views of the contralateral common femoral vein are
unremarkable.

OTHER

There is a 2.0 x 0.8 x 1.1 cm cystic structure in the left popliteal
fossa, likely Baker's cyst.

There is mild subcutaneous edema of the left lower extremity.

Limitations: none
IMPRESSION: 1. No sonographic findings of DVT.
2. Probable left Baker cyst.

## 2022-10-08 IMAGING — NM NM PULMONARY PERF PARTICULATE
1 series · 8 of 8 positions shown · non-contrast
Comparison: Chest radiograph October 07, 2020

CLINICAL DATA: Shortness of breath

EXAM:
NUCLEAR MEDICINE PERFUSION LUNG SCAN
TECHNIQUE: Perfusion images were obtained in multiple projections after
intravenous injection of radiopharmaceutical.
Views: Anterior, posterior, left lateral, right lateral, RPO, LPO,
RAO, LAO.
RADIOPHARMACEUTICALS:  4.16 mCi Ec-XXm MAA IV

[Series 1000: lung perfusion · 1.65mm/px · 4 acquisitions, 8 frames shown]
[im 1/4]
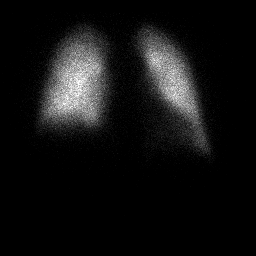
[im 1/4]
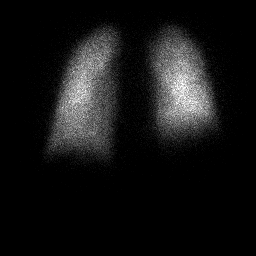
[im 2/4  full-range]
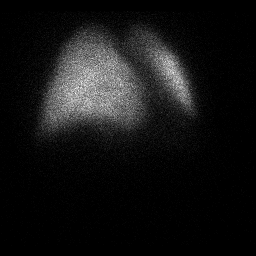
[im 2/4  full-range]
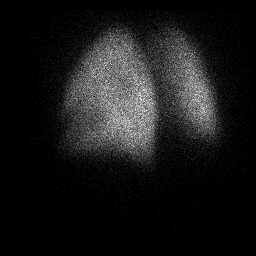
[im 3/4  full-range]
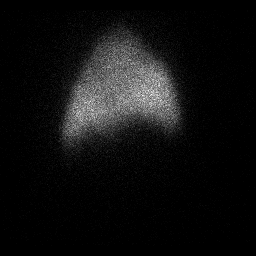
[im 3/4  full-range]
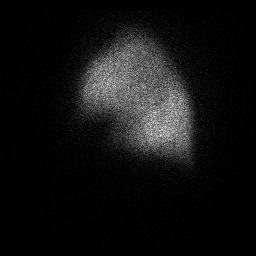
[im 4/4]
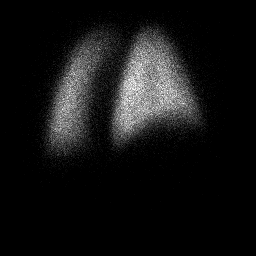
[im 4/4]
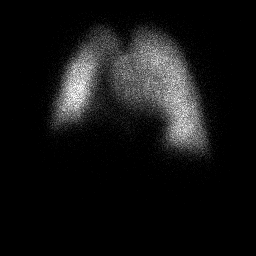

[8 of 8 positions shown; findings below may reference images not displayed]

FINDINGS: Radiotracer uptake is homogeneous and symmetric bilaterally. No
perfusion defects evident.
IMPRESSION: No perfusion defects evident. No findings indicative of pulmonary
embolus.

## 2024-03-06 ENCOUNTER — Observation Stay

## 2024-03-06 ENCOUNTER — Emergency Department

## 2024-03-06 ENCOUNTER — Other Ambulatory Visit: Payer: Self-pay

## 2024-03-06 ENCOUNTER — Observation Stay
Admission: EM | Admit: 2024-03-06 | Discharge: 2024-03-07 | Disposition: A | Discharge status: Home / Self Care | Attending: Internal Medicine | Admitting: Internal Medicine

## 2024-03-06 DIAGNOSIS — Z794 Long term (current) use of insulin: Secondary | ICD-10-CM | POA: Diagnosis not present

## 2024-03-06 DIAGNOSIS — E1129 Type 2 diabetes mellitus with other diabetic kidney complication: Secondary | ICD-10-CM | POA: Diagnosis present

## 2024-03-06 DIAGNOSIS — I130 Hypertensive heart and chronic kidney disease with heart failure and stage 1 through stage 4 chronic kidney disease, or unspecified chronic kidney disease: Secondary | ICD-10-CM | POA: Diagnosis not present

## 2024-03-06 DIAGNOSIS — D509 Iron deficiency anemia, unspecified: Secondary | ICD-10-CM | POA: Diagnosis not present

## 2024-03-06 DIAGNOSIS — R55 Syncope and collapse: Principal | ICD-10-CM | POA: Diagnosis present

## 2024-03-06 DIAGNOSIS — I69351 Hemiplegia and hemiparesis following cerebral infarction affecting right dominant side: Secondary | ICD-10-CM | POA: Diagnosis not present

## 2024-03-06 DIAGNOSIS — E785 Hyperlipidemia, unspecified: Secondary | ICD-10-CM | POA: Insufficient documentation

## 2024-03-06 DIAGNOSIS — R0902 Hypoxemia: Secondary | ICD-10-CM

## 2024-03-06 DIAGNOSIS — Z87891 Personal history of nicotine dependence: Secondary | ICD-10-CM | POA: Insufficient documentation

## 2024-03-06 DIAGNOSIS — E1122 Type 2 diabetes mellitus with diabetic chronic kidney disease: Secondary | ICD-10-CM | POA: Diagnosis not present

## 2024-03-06 DIAGNOSIS — R42 Dizziness and giddiness: Secondary | ICD-10-CM | POA: Diagnosis present

## 2024-03-06 DIAGNOSIS — I5032 Chronic diastolic (congestive) heart failure: Secondary | ICD-10-CM | POA: Diagnosis not present

## 2024-03-06 DIAGNOSIS — I10 Essential (primary) hypertension: Secondary | ICD-10-CM | POA: Diagnosis present

## 2024-03-06 DIAGNOSIS — I480 Paroxysmal atrial fibrillation: Secondary | ICD-10-CM | POA: Diagnosis present

## 2024-03-06 DIAGNOSIS — N179 Acute kidney failure, unspecified: Secondary | ICD-10-CM | POA: Diagnosis present

## 2024-03-06 DIAGNOSIS — I639 Cerebral infarction, unspecified: Secondary | ICD-10-CM | POA: Diagnosis present

## 2024-03-06 DIAGNOSIS — N1832 Chronic kidney disease, stage 3b: Secondary | ICD-10-CM | POA: Diagnosis not present

## 2024-03-06 DIAGNOSIS — Z79899 Other long term (current) drug therapy: Secondary | ICD-10-CM | POA: Diagnosis not present

## 2024-03-06 DIAGNOSIS — Z7982 Long term (current) use of aspirin: Secondary | ICD-10-CM | POA: Diagnosis not present

## 2024-03-06 DIAGNOSIS — E1169 Type 2 diabetes mellitus with other specified complication: Secondary | ICD-10-CM | POA: Diagnosis present

## 2024-03-06 DIAGNOSIS — Z89512 Acquired absence of left leg below knee: Secondary | ICD-10-CM | POA: Insufficient documentation

## 2024-03-06 DIAGNOSIS — Z8673 Personal history of transient ischemic attack (TIA), and cerebral infarction without residual deficits: Secondary | ICD-10-CM | POA: Insufficient documentation

## 2024-03-06 LAB — COMPREHENSIVE METABOLIC PANEL WITH GFR
ALT: 62 U/L — ABNORMAL HIGH (ref 0–44)
AST: 58 U/L — ABNORMAL HIGH (ref 15–41)
Albumin: 3.4 g/dL — ABNORMAL LOW (ref 3.5–5.0)
Alkaline Phosphatase: 100 U/L (ref 38–126)
Anion gap: 7 (ref 5–15)
BUN: 97 mg/dL — ABNORMAL HIGH (ref 8–23)
CO2: 21 mmol/L — ABNORMAL LOW (ref 22–32)
Calcium: 8.8 mg/dL — ABNORMAL LOW (ref 8.9–10.3)
Chloride: 109 mmol/L (ref 98–111)
Creatinine, Ser: 3.86 mg/dL — ABNORMAL HIGH (ref 0.61–1.24)
GFR, Estimated: 16 mL/min — ABNORMAL LOW (ref >60)
Glucose, Bld: 133 mg/dL — ABNORMAL HIGH (ref 70–99)
Potassium: 4.8 mmol/L (ref 3.5–5.1)
Sodium: 137 mmol/L (ref 135–145)
Total Bilirubin: 0.6 mg/dL (ref 0.0–1.2)
Total Protein: 7.2 g/dL (ref 6.5–8.1)

## 2024-03-06 LAB — CBC
HCT: 32.3 % — ABNORMAL LOW (ref 39.0–52.0)
Hemoglobin: 9 g/dL — ABNORMAL LOW (ref 13.0–17.0)
MCH: 18.9 pg — ABNORMAL LOW (ref 26.0–34.0)
MCHC: 27.9 g/dL — ABNORMAL LOW (ref 30.0–36.0)
MCV: 68 fL — ABNORMAL LOW (ref 80.0–100.0)
Platelets: 202 10*3/uL (ref 150–400)
RBC: 4.75 MIL/uL (ref 4.22–5.81)
RDW: 23.6 % — ABNORMAL HIGH (ref 11.5–15.5)
WBC: 5.6 10*3/uL (ref 4.0–10.5)
nRBC: 0 % (ref 0.0–0.2)

## 2024-03-06 LAB — D-DIMER, QUANTITATIVE: D-Dimer, Quant: 0.34 ug{FEU}/mL (ref 0.00–0.50)

## 2024-03-06 LAB — TROPONIN I (HIGH SENSITIVITY)
Troponin I (High Sensitivity): 16 ng/L (ref <18)
Troponin I (High Sensitivity): 17 ng/L (ref <18)

## 2024-03-06 LAB — BRAIN NATRIURETIC PEPTIDE: B Natriuretic Peptide: 232.9 pg/mL — ABNORMAL HIGH (ref 0.0–100.0)

## 2024-03-06 MED ORDER — HYDRALAZINE HCL 20 MG/ML IJ SOLN: 5.0000 mg | INTRAMUSCULAR | Status: DC | PRN

## 2024-03-06 MED ORDER — ACETAMINOPHEN 325 MG PO TABS: 650.0000 mg | ORAL_TABLET | Freq: Four times a day (QID) | ORAL | Status: DC | PRN

## 2024-03-06 MED ORDER — SODIUM CHLORIDE 0.9 % IV SOLN: INTRAVENOUS | Status: AC

## 2024-03-06 MED ORDER — INSULIN ASPART 100 UNIT/ML IJ SOLN: 0.0000 [IU] | Freq: Every day | INTRAMUSCULAR | Status: DC

## 2024-03-06 MED ORDER — ONDANSETRON HCL 4 MG/2ML IJ SOLN: 4.0000 mg | Freq: Three times a day (TID) | INTRAMUSCULAR | Status: DC | PRN

## 2024-03-06 MED ORDER — HEPARIN SODIUM (PORCINE) 5000 UNIT/ML IJ SOLN
5000.0000 [IU] | Freq: Three times a day (TID) | INTRAMUSCULAR | Status: DC
  Administered 2024-03-06 – 2024-03-07 (×3): 5000 [IU] via SUBCUTANEOUS
  Filled 2024-03-06 (×3): qty 1

## 2024-03-06 MED ORDER — SODIUM CHLORIDE 0.9 % IV SOLN: INTRAVENOUS | Status: DC

## 2024-03-06 MED ORDER — SODIUM CHLORIDE 0.9 % IV BOLUS
1000.0000 mL | Freq: Once | INTRAVENOUS | Status: AC
  Administered 2024-03-06: 1000 mL via INTRAVENOUS

## 2024-03-06 MED ORDER — INSULIN ASPART 100 UNIT/ML IJ SOLN
0.0000 [IU] | Freq: Three times a day (TID) | INTRAMUSCULAR | Status: DC
  Administered 2024-03-07: 2 [IU] via SUBCUTANEOUS
  Filled 2024-03-06: qty 1

## 2024-03-06 NOTE — ED Triage Notes (Signed)
 From home states he was working with PT walking with his new prosthetic leg when he became dizzy. After EMS arrival pt stated he felt fine. Denies CP

## 2024-03-06 NOTE — H&P (Signed)
 History and Physical    Anthony Fox ZOX:[id number] DOB: 1950-11-21 DOA: 03/06/2024  Referring MD/NP/PA:   PCP: Anthony Abbot, PA   Patient coming from:  The patient is coming from home.     Chief Complaint: Near syncope  HPI: Anthony Fox is a 73 y.o. male with medical history significant of hypertension, hyperlipidemia, diabetes mellitus, PVD, AICD, diastolic CHF, stroke with chronic right arm numbness and mild right-sided weakness), GERD, CKD-3B, anemia, A-fib not on anticoagulants (taking Plavix currently), s/p of left BKA, who presents with near syncope.  Patient states that he had left BKA about 6 months ago, and 2 weeks ago he got his new prosthetic leg.  Today he was working with PT walking with his new prosthetic leg when he became dizzy, diaphoretic and lightheadedness, almost passed out, but he did not.  No fall or injury.  Patient denies unilateral numbness or tingling in extremities.  No facial droop or slurred speech.  He has history of stroke with chronic right arm numbness and mild right-sided weakness, which has not changed today  Patient denies shortness breath, and was found to have oxygen desaturation to 83% on room air, which improved to 93% on 2 L oxygen.  No chest pain, cough, fever or chills.  Patient has nausea, vomited once, still have dry heaves, no abdominal pain or diarrhea.  Patient denies dark stool or rectal bleeding.  No symptoms of UTI.  Data reviewed independently and ED Course: pt was found to have WBC 5.6, worsening renal function with creatinine 3.86, BUN 97 and GFR 16 (recent baseline creatinine 2.69 on 07/24/2023), troponin 16 --> 17.  Temperature normal, blood pressure 139/49, heart rate 77, RR 18.  Chest x-ray negative.  CT head is negative for acute intracranial abnormalities.  Patient is placed in telemetry bed for observation.  EKG: I have personally reviewed.  Seem to be sinus rhythm, regular, QTc 491, borderline left axis deviation, poor R  wave progression, anteroseptal infarction pattern.   Review of Systems:   General: no fevers, chills, no body weight gain, has fatigue HEENT: no blurry vision, hearing changes or sore throat Respiratory: no dyspnea, coughing, wheezing CV: no chest pain, no palpitations GI: has nausea, vomiting, no abdominal pain, diarrhea, constipation GU: no dysuria, burning on urination, increased urinary frequency, hematuria  Ext: no leg edema Neuro: no unilateral weakness, numbness, or tingling, no vision change or hearing loss.  Has dizziness, lightheadedness Skin: no rash, no skin tear. MSK: No muscle spasm, no deformity, no limitation of range of movement in spin Heme: No easy bruising.  Travel history: No recent long distant travel.   Allergy:  Allergies  Allergen Reactions   Minocycline Rash    Past Medical History:  Diagnosis Date   Anemia due to chronic kidney disease    Arrhythmia    atrial fibrillation   CHF (congestive heart failure) (HCC)    CKD (chronic kidney disease), stage III (HCC)    Hyperlipidemia associated with type 2 diabetes mellitus (HCC)    Hypertension associated with diabetes (HCC)    Insulin  dependent type 2 diabetes mellitus (HCC)    PAD (peripheral artery disease) (HCC)     Past Surgical History:  Procedure Laterality Date   ESOPHAGOGASTRODUODENOSCOPY (EGD) WITH PROPOFOL  N/A 01/25/2021   Procedure: ESOPHAGOGASTRODUODENOSCOPY (EGD) WITH PROPOFOL ;  Surgeon: Toledo, Alphonsus Jeans, MD;  Location: [facility] ENDOSCOPY;  Service: Gastroenterology;  Laterality: N/A;  melena, symptomatic anemia, hemoccult positive stool   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 His smoking use included cigarettes. He has never used smokeless tobacco. He reports that he does not drink alcohol and does not use drugs.  Family History:  Family History  Problem Relation Age of Onset    Diabetes Mother    Hypertension Mother    Diabetes Brother      Prior to Admission medications   Medication Sig Start Date End Date Taking? Authorizing Provider  amLODipine  (NORVASC ) 5 MG tablet Take by mouth. 02/07/21   [provider]  aspirin  81 MG chewable tablet Chew 81 mg by mouth daily. 12/31/19   [provider]  atorvastatin  (LIPITOR) 40 MG tablet Take 40 mg by mouth at bedtime. 07/30/20   [provider]  clopidogrel (PLAVIX) 75 MG tablet Take 75 mg by mouth daily. 07/30/20   [provider]  ELIQUIS 5 MG TABS tablet Take 5 mg by mouth 2 (two) times daily. Patient not taking: Reported on 06/02/2021 04/21/21   [provider]  empagliflozin (JARDIANCE) 10 MG TABS tablet Take by mouth. 05/05/21   [provider]  ferrous sulfate  325 (65 FE) MG tablet Take 325 mg by mouth daily with breakfast.    [provider]  gabapentin  (NEURONTIN ) 300 MG capsule Take 300 mg by mouth at bedtime. 09/22/20   [provider]  glucose blood (ONETOUCH ULTRA) test strip USE TO CHECK BLOOD SUGAR BEFORE BREAKFAST, 2 HOURS AFTER LARGEST MEAL, AND AT BEDTIME. 05/06/21   [provider]  insulin  NPH Human (NOVOLIN N) 100 UNIT/ML injection Inject 0.14 mLs (14 Units total) into the skin 2 (two) times daily before a meal. 01/26/21   Clelia Current, Richard, MD  lisinopril (ZESTRIL) 5 MG tablet Take 5 mg by mouth daily. 09/16/20   [provider]  metoprolol  tartrate (LOPRESSOR ) 50 MG tablet Take 1 tablet (50 mg total) by mouth 2 (two) times daily. APPOINTMENT REQUIRED FOR FURTHER REFILLS. 2ND ATTEMPT 08/02/21   Roark Chick, PA-C  mupirocin  ointment (BACTROBAN ) 2 % Place 1 application into the nose 2 (two) times daily. Patient not taking: Reported on 06/02/2021 01/26/21   Verla Glaze, MD  pantoprazole  (PROTONIX ) 40 MG tablet Take 1 tablet (40 mg total) by mouth daily. Patient not taking: Reported on 06/02/2021 01/26/21 02/25/21  Verla Glaze, MD  sodium zirconium cyclosilicate (LOKELMA) 10 g PACK packet Take 10 g by mouth every other day.    [provider]  torsemide  (DEMADEX ) 20 MG tablet Take 1 tablet (20 mg total) by mouth daily. MUST BE SEEN IN CLINIC FOR ANY FURTHER REFILLS. PLEASE CALL OFFICE TO SCHEDULE AN APPOINTMENT. 10/11/21   Roark Chick, PA-C  vitamin B-12 (CYANOCOBALAMIN ) 1000 MCG tablet Take 1 tablet (1,000 mcg total) by mouth daily. 01/26/21   Verla Glaze, MD    Physical Exam: Vitals:   03/06/24 1510 03/06/24 1516 03/06/24 1918 03/06/24 2059  BP: (!) 139/49   (!) 141/41  Pulse: 77   64  Resp: 18   16  Temp:  98.3 F (36.8 C) 98.2 F (36.8 C) 97.6 F (36.4 C)  TempSrc:  Oral Oral Oral  SpO2: 93%   99%   General: Not in acute distress.  Dry mucous membrane HEENT:       Eyes: PERRL, EOMI, no jaundice       ENT: No discharge from the ears and nose, no pharynx injection, no tonsillar enlargement.  Neck: No JVD, no bruit, no mass felt. Heme: No neck lymph node enlargement. Cardiac: S1/S2, RRR, No murmurs, No gallops or rubs. Respiratory: No rales, wheezing, rhonchi or rubs. GI: Soft, nondistended, nontender, no rebound pain, no organomegaly, BS present. GU: No hematuria Ext: No pitting leg edema bilaterally. S/p of left BKA Musculoskeletal: No joint deformities, No joint redness or warmth, no limitation of ROM in spin. Skin: No rashes.  Neuro: Alert, oriented X3, cranial nerves II-XII grossly intact, has mild right-sided weakness from previous stroke Psych: Patient is not psychotic, no suicidal or hemocidal ideation.  Labs on Admission: I have personally reviewed following labs and imaging studies  CBC: Recent Labs  Lab 03/06/24 1521  WBC 5.6  HGB 9.0*  HCT 32.3*  MCV 68.0*  PLT 202   Basic Metabolic Panel: Recent Labs  Lab 03/06/24 1521  NA 137  K 4.8  CL 109  CO2 21*  GLUCOSE 133*  BUN 97*  CREATININE 3.86*  CALCIUM  8.8*   GFR: CrCl cannot be calculated  (Unknown ideal weight.). Liver Function Tests: Recent Labs  Lab 03/06/24 1521  AST 58*  ALT 62*  ALKPHOS 100  BILITOT 0.6  PROT 7.2  ALBUMIN  3.4*   No results for input(s): "LIPASE", "AMYLASE" in the last 168 hours. No results for input(s): "AMMONIA" in the last 168 hours. Coagulation Profile: No results for input(s): "INR", "PROTIME" in the last 168 hours. Cardiac Enzymes: No results for input(s): "CKTOTAL", "CKMB", "CKMBINDEX", "TROPONINI" in the last 168 hours. BNP (last 3 results) No results for input(s): "PROBNP" in the last 8760 hours. HbA1C: No results for input(s): "HGBA1C" in the last 72 hours. CBG: No results for input(s): "GLUCAP" in the last 168 hours. Lipid Profile: No results for input(s): "CHOL", "HDL", "LDLCALC", "TRIG", "CHOLHDL", "LDLDIRECT" in the last 72 hours. Thyroid Function Tests: No results for input(s): "TSH", "T4TOTAL", "FREET4", "T3FREE", "THYROIDAB" in the last 72 hours. Anemia Panel: No results for input(s): "VITAMINB12", "FOLATE", "FERRITIN", "TIBC", "IRON ", "RETICCTPCT" in the last 72 hours. Urine analysis:    Component Value Date/Time   COLORURINE STRAW (A) 05/16/2022 2142   APPEARANCEUR HAZY (A) 05/16/2022 2142   LABSPEC 1.009 05/16/2022 2142   PHURINE 5.0 05/16/2022 2142   GLUCOSEU NEGATIVE 05/16/2022 2142   HGBUR NEGATIVE 05/16/2022 2142   BILIRUBINUR NEGATIVE 05/16/2022 2142   KETONESUR NEGATIVE 05/16/2022 2142   PROTEINUR 100 (A) 05/16/2022 2142   NITRITE NEGATIVE 05/16/2022 2142   LEUKOCYTESUR LARGE (A) 05/16/2022 2142   Sepsis Labs: @LABRCNTIP (procalcitonin:4,lacticidven:4) )No results found for this or any previous visit (from the past 240 hours).   Radiological Exams on Admission:   Assessment/Plan Principal Problem:   Near syncope Active Problems:   Acute renal failure superimposed on stage 3b chronic kidney disease (HCC)   HTN (hypertension)   Paroxysmal atrial fibrillation (HCC)   Hyperlipidemia associated with  type 2 diabetes mellitus (HCC)   Chronic diastolic CHF (congestive heart failure) (HCC)   Stroke (HCC)   Type II diabetes mellitus with renal manifestations (HCC)   Iron  deficiency anemia   Assessment and Plan:  Near syncope: Etiology is not clear, possibly due to dehydration and volume depletion.  Patient is clinically dry.  Patient is still taking torsemide .  D-dimer negative at 0.34, low suspicions for PE.  Will discontinue VQ scan which is ordered by EDP.  CT head negative for acute intracranial malady.  Patient has chronic right arm numbness and mild right-sided weakness from previous stroke, which has not changed.  Low suspicion  for new stroke.  - Place in telemetry bed for observation - Check orthostatic vital signs - IV fluid: 1 L normal saline bolus in ED, then 50 cc/h for 8 hours - Fall precaution - Frequent neurocheck - PT/OT  Acute renal failure superimposed on stage 3b chronic kidney disease (HCC): Likely due to dehydration and continuation of diuretics and lisinopril -Hold torsemide  and lisinopril - IV fluid as above  HTN (hypertension) -IV hydralazine  as needed - Hold torsemide  and lisinopril as above - Metoprolol   Paroxysmal atrial fibrillation [facility]): Patient is not taking anticoagulants, but is taking Plavix -Patient is on Plavix  Hyperlipidemia associated with type 2 diabetes mellitus (HCC) -Lipitor  Chronic diastolic CHF (congestive heart failure) (HCC): 2D echo on 10/06/2020 showed EF of 55 to 60%.  Patient has 2 L new oxygen requirement, but denies SOB, no respiratory distress.  No leg edema or JVD.  Clinically seems to be dry.  Does not seem to have CHF suspicion. - Hold torsemide  due to worsening renal function  History of stroke (HCC) -Plavix and Lipitor  Type II diabetes mellitus with renal manifestations [facility]): Recent A1c 8.1, poorly controlled.  Patient taking Jardiance and NPH insulin  10 unit a morning -NPH insulin  7 units daily *** - Sliding scale  insulin   Iron  deficiency anemia: Hemoglobin stable 9.0 (9.2 on 07/24/2023) - Continue iron  supplement      DVT ppx: SQ Heparin    Code Status: Full code   Family Communication:  Yes, patient's wife  at bed side.    Disposition Plan:  Anticipate discharge back to previous environment  Consults called: none  Admission status and Level of care: Telemetry Medical:    for obs     Dispo: The patient is from: Home              Anticipated d/c is to: Home              Anticipated d/c date is: 1 day              Patient currently is not medically stable to d/c.    Severity of Illness:  The appropriate patient status for this patient is OBSERVATION. Observation status is judged to be reasonable and necessary in order to provide the required intensity of service to ensure the patient's safety. The patient's presenting symptoms, physical exam findings, and initial radiographic and laboratory data in the context of their medical condition is felt to place them at decreased risk for further clinical deterioration. Furthermore, it is anticipated that the patient will be medically stable for discharge from the hospital within 2 midnights of admission.        Date of Service 03/06/2024    Fidencio Hue [facility]   If 7PM-7AM, please contact night-coverage [email or web address] 03/06/2024, 9:04 PM

## 2024-03-06 NOTE — ED Provider Notes (Signed)
 [facility] Provider Note    Event Date/Time   First MD Initiated Contact with Patient 03/06/24 1513     (approximate)  History   Chief Complaint: Near Syncope  HPI  Anthony Fox is a 73 y.o. male with a past medical history of anemia, CKD, CHF, hypertension, hyperlipidemia, diabetes, presents to the emergency department for lightheadedness/near syncope.  According to the patient he was working with physical therapy working to ambulate with his new prosthetic that he has had for the last few weeks.  Patient states while ambulating he became very weak and lightheaded.  Patient states he felt like he could pass out he became nauseated and vomited once.  Patient states they called EMS by the time EMS arrived he was feeling back to normal.  Physical Exam   Triage Vital Signs: ED Triage Vitals  Encounter Vitals Group     BP 03/06/24 1510 (!) 139/49     Systolic BP Percentile --      Diastolic BP Percentile --      Pulse Rate 03/06/24 1510 77     Resp 03/06/24 1510 18     Temp 03/06/24 1516 98.3 F (36.8 C)     Temp Source 03/06/24 1516 Oral     SpO2 03/06/24 1510 93 %     Weight --      Height --      Head Circumference --      Peak Flow --      Pain Score --      Pain Loc --      Pain Education --      Exclude from Growth Chart --     Most recent vital signs: Vitals:   03/06/24 1510 03/06/24 1516  BP: (!) 139/49   Pulse: 77   Resp: 18   Temp:  98.3 F (36.8 C)  SpO2: 93%     General: Awake, no distress.  CV:  Good peripheral perfusion.  Regular rate and rhythm  Resp:  Normal effort.  Equal breath sounds bilaterally.  Abd:  No distention.  Soft, nontender.  No rebound or guarding.  ED Results / Procedures / Treatments   EKG  EKG viewed and interpreted by myself shows a sinus rhythm at 64 bpm with a narrow QRS, normal axis, normal intervals, no concerning ST changes.  RADIOLOGY  I have reviewed interpret the chest x-ray images.  No  obvious consolidation on my evaluation. Radiology has read the x-ray as negative   MEDICATIONS ORDERED IN ED: Medications  sodium chloride  0.9 % bolus 1,000 mL (has no administration in time range)     IMPRESSION / MDM / ASSESSMENT AND PLAN / ED COURSE  I reviewed the triage vital signs and the nursing notes.  Patient's presentation is most consistent with acute presentation with potential threat to life or bodily function.  Patient presents to the emergency department after a near syncopal episode at which time the patient became nauseated and diaphoretic.  Patient states upon arrival to the emergency department he feels back to normal.  This occurred during physical therapy.  Is not clear if this is from overexertion.  Patient denies any chest pain or shortness of breath at any point.  Differential would also include vagal event, hypotension, less likely PE.  Here in the emergency department the patient's workup shows a reassuring CBC with baseline anemia compared to the patient's historical values.  Patient's chemistry however does show acute kidney injury with a creatinine  of 3.86 which is increased from around 2.2-2.6 over the past 1 year.  Patient states he follows up with [facility] nephrology but has not seen them in several years.  Patient's GFR has decreased from 28 down to 16.  Patient's troponin is reassuringly negative, will recheck after 2 hours.  Patient however is somewhat hypoxic at times as low as 88 and 89% with a good waveform.  Patient denies any shortness of breath.  Will obtain a chest x-ray as a precaution.  Patient satting well on 2 L but no baseline O2 requirement.  If the patient's chest x-ray appears clear we will likely admit for further workup and possible VQ scan.  If the x-ray shows a reason for the decreased oxygen level we will treat, regardless we will admit to the hospital service once the x-ray has resulted.  Patient's x-ray is negative for acute process.  Will proceed  with a VQ scan and admit to the hospital service for further workup and treatment.  AKI, patient receiving IV fluids. FINAL CLINICAL IMPRESSION(S) / ED DIAGNOSES   Weakness Acute kidney injury Hypoxia  Note:  This document was prepared using Dragon voice recognition software and may include unintentional dictation errors.   Ruth Cove, MD 03/06/24 [phone]

## 2024-03-06 NOTE — ED Notes (Signed)
 TRANSPORT paged at this time to transport pt to assigned in-patient unit bed

## 2024-03-07 ENCOUNTER — Encounter: Payer: Self-pay | Admitting: Internal Medicine

## 2024-03-07 DIAGNOSIS — I639 Cerebral infarction, unspecified: Secondary | ICD-10-CM

## 2024-03-07 DIAGNOSIS — E1169 Type 2 diabetes mellitus with other specified complication: Secondary | ICD-10-CM | POA: Diagnosis not present

## 2024-03-07 DIAGNOSIS — N184 Chronic kidney disease, stage 4 (severe): Secondary | ICD-10-CM

## 2024-03-07 DIAGNOSIS — I10 Essential (primary) hypertension: Secondary | ICD-10-CM | POA: Diagnosis not present

## 2024-03-07 DIAGNOSIS — Z794 Long term (current) use of insulin: Secondary | ICD-10-CM

## 2024-03-07 DIAGNOSIS — N179 Acute kidney failure, unspecified: Secondary | ICD-10-CM | POA: Diagnosis not present

## 2024-03-07 DIAGNOSIS — R55 Syncope and collapse: Secondary | ICD-10-CM | POA: Diagnosis not present

## 2024-03-07 DIAGNOSIS — E1122 Type 2 diabetes mellitus with diabetic chronic kidney disease: Secondary | ICD-10-CM

## 2024-03-07 DIAGNOSIS — R0902 Hypoxemia: Secondary | ICD-10-CM

## 2024-03-07 LAB — BASIC METABOLIC PANEL WITH GFR
Anion gap: 10 (ref 5–15)
BUN: 84 mg/dL — ABNORMAL HIGH (ref 8–23)
CO2: 20 mmol/L — ABNORMAL LOW (ref 22–32)
Calcium: 9.1 mg/dL (ref 8.9–10.3)
Chloride: 110 mmol/L (ref 98–111)
Creatinine, Ser: 2.97 mg/dL — ABNORMAL HIGH (ref 0.61–1.24)
GFR, Estimated: 22 mL/min — ABNORMAL LOW (ref >60)
Glucose, Bld: 88 mg/dL (ref 70–99)
Potassium: 5.1 mmol/L (ref 3.5–5.1)
Sodium: 140 mmol/L (ref 135–145)

## 2024-03-07 LAB — CBC
HCT: 30.8 % — ABNORMAL LOW (ref 39.0–52.0)
Hemoglobin: 8.6 g/dL — ABNORMAL LOW (ref 13.0–17.0)
MCH: 18.6 pg — ABNORMAL LOW (ref 26.0–34.0)
MCHC: 27.9 g/dL — ABNORMAL LOW (ref 30.0–36.0)
MCV: 66.7 fL — ABNORMAL LOW (ref 80.0–100.0)
Platelets: 180 10*3/uL (ref 150–400)
RBC: 4.62 MIL/uL (ref 4.22–5.81)
RDW: 23.3 % — ABNORMAL HIGH (ref 11.5–15.5)
WBC: 5 10*3/uL (ref 4.0–10.5)
nRBC: 0 % (ref 0.0–0.2)

## 2024-03-07 LAB — GLUCOSE, CAPILLARY
Glucose-Capillary: 179 mg/dL — ABNORMAL HIGH (ref 70–99)
Glucose-Capillary: 79 mg/dL (ref 70–99)
Glucose-Capillary: 188 mg/dL — ABNORMAL HIGH (ref 70–99)

## 2024-03-07 MED ORDER — ASPIRIN 81 MG PO CHEW: 81.0000 mg | CHEWABLE_TABLET | Freq: Every day | ORAL | 1 refills | Status: DC

## 2024-03-07 MED ORDER — CLOPIDOGREL BISULFATE 75 MG PO TABS
75.0000 mg | ORAL_TABLET | Freq: Every day | ORAL | Status: DC
  Administered 2024-03-07: 75 mg via ORAL
  Filled 2024-03-07: qty 1

## 2024-03-07 MED ORDER — VITAMIN B-12 1000 MCG PO TABS
1000.0000 ug | ORAL_TABLET | Freq: Every day | ORAL | Status: DC
  Administered 2024-03-07: 1000 ug via ORAL
  Filled 2024-03-07: qty 1

## 2024-03-07 MED ORDER — AMLODIPINE BESYLATE 10 MG PO TABS
10.0000 mg | ORAL_TABLET | Freq: Every day | ORAL | Status: DC
Start: 2024-03-07 — End: 2024-03-07
  Administered 2024-03-07: 10 mg via ORAL
  Filled 2024-03-07: qty 1

## 2024-03-07 MED ORDER — EPOETIN ALFA-EPBX 10000 UNIT/ML IJ SOLN
4000.0000 [IU] | Freq: Once | INTRAMUSCULAR | Status: AC
  Administered 2024-03-07: 4000 [IU] via SUBCUTANEOUS
  Filled 2024-03-07: qty 2

## 2024-03-07 MED ORDER — PANTOPRAZOLE SODIUM 40 MG PO TBEC: 40.0000 mg | DELAYED_RELEASE_TABLET | Freq: Every day | ORAL | Status: DC | PRN

## 2024-03-07 MED ORDER — ATORVASTATIN CALCIUM 20 MG PO TABS: 80.0000 mg | ORAL_TABLET | Freq: Every day | ORAL | Status: DC

## 2024-03-07 MED ORDER — PANTOPRAZOLE SODIUM 40 MG PO TBEC: 40.0000 mg | DELAYED_RELEASE_TABLET | Freq: Every day | ORAL | 0 refills | Status: DC

## 2024-03-07 MED ORDER — ASPIRIN 81 MG PO CHEW
81.0000 mg | CHEWABLE_TABLET | Freq: Every day | ORAL | Status: DC
  Administered 2024-03-07: 81 mg via ORAL
  Filled 2024-03-07: qty 1

## 2024-03-07 MED ORDER — SODIUM BICARBONATE 650 MG PO TABS
650.0000 mg | ORAL_TABLET | Freq: Two times a day (BID) | ORAL | Status: DC
  Administered 2024-03-07: 650 mg via ORAL
  Filled 2024-03-07: qty 1

## 2024-03-07 MED ORDER — FERROUS SULFATE 325 (65 FE) MG PO TABS
325.0000 mg | ORAL_TABLET | Freq: Every day | ORAL | Status: DC
  Administered 2024-03-07: 325 mg via ORAL
  Filled 2024-03-07: qty 1

## 2024-03-07 MED ORDER — INSULIN NPH (HUMAN) (ISOPHANE) 100 UNIT/ML ~~LOC~~ SUSP: 7.0000 [IU] | Freq: Every day | SUBCUTANEOUS | Status: DC

## 2024-03-07 MED ORDER — TORSEMIDE 20 MG PO TABS: 20.0000 mg | ORAL_TABLET | Freq: Every day | ORAL | Status: DC

## 2024-03-07 MED ORDER — INSULIN GLARGINE-YFGN 100 UNIT/ML ~~LOC~~ SOLN
10.0000 [IU] | Freq: Every day | SUBCUTANEOUS | Status: DC
  Filled 2024-03-07: qty 0.1

## 2024-03-07 MED ORDER — SODIUM BICARBONATE 650 MG PO TABS: 650.0000 mg | ORAL_TABLET | Freq: Two times a day (BID) | ORAL | 1 refills | Status: DC

## 2024-03-07 MED ORDER — METOPROLOL TARTRATE 50 MG PO TABS: 50.0000 mg | ORAL_TABLET | Freq: Two times a day (BID) | ORAL | Status: DC

## 2024-03-07 MED ORDER — GABAPENTIN 300 MG PO CAPS: 300.0000 mg | ORAL_CAPSULE | Freq: Every day | ORAL | Status: DC

## 2024-03-07 MED ORDER — METOPROLOL SUCCINATE ER 50 MG PO TB24
100.0000 mg | ORAL_TABLET | Freq: Every day | ORAL | Status: DC
  Administered 2024-03-07: 100 mg via ORAL
  Filled 2024-03-07: qty 2

## 2024-03-07 MED ORDER — AMLODIPINE BESYLATE 5 MG PO TABS
5.0000 mg | ORAL_TABLET | Freq: Every day | ORAL | Status: DC
Start: 2024-03-07 — End: 2024-03-07

## 2024-03-07 MED ORDER — AMLODIPINE BESYLATE 10 MG PO TABS: 10.0000 mg | ORAL_TABLET | Freq: Every day | ORAL | 2 refills | Status: DC

## 2024-03-07 NOTE — Discharge Summary (Signed)
 Physician Discharge Summary   Patient: Anthony Fox MRN: [id number] DOB: 1951-05-03  Admit date:     03/06/2024  Discharge date: 03/07/24  Discharge Physician: Luna Salinas   PCP: Abbott Abbot, PA   Recommendations at discharge:  Please obtain CBC and renal function on follow-up Follow-up with primary care provider within a week Follow-up with nephrology-patient need regular Epogen which can be arranged by nephrologist. Nephrology can determine further need of torsemide  and lisinopril-currently being held  Discharge Diagnoses: Principal Problem:   Near syncope Active Problems:   Acute renal failure superimposed on stage 3b chronic kidney disease (HCC)   HTN (hypertension)   Paroxysmal atrial fibrillation (HCC)   Hyperlipidemia associated with type 2 diabetes mellitus (HCC)   Chronic diastolic CHF (congestive heart failure) (HCC)   Stroke (HCC)   Type II diabetes mellitus with renal manifestations (HCC)   Iron  deficiency anemia   Hypoxia   Hospital Course: Taken from H&P.  Anthony Fox is a 73 y.o. male with medical history significant of hypertension, hyperlipidemia, diabetes mellitus, PVD, AICD, diastolic CHF, stroke with chronic right arm numbness and mild right-sided weakness), GERD, CKD-4, anemia, A-fib not on anticoagulants (taking Plavix currently), s/p of left BKA, who presents with near syncope.   Patient was working with PT with his new prosthetic leg when he became dizzy, diaphoretic, lightheaded nests and almost passed out but never lost consciousness.  No fall or injury.  On presentation patient was mildly hypoxic at 83% which improved with 2 L of oxygen.  Rest of the vitals stable.  Labs with worsening renal function, creatinine at 3.86, BUN 97 with baseline of 2.69 on 06/27/2023.  Chest x-ray and CT head was negative for any acute abnormality.  EKG with NSR, QTc 491, borderline left axis deviation, poor R wave progression and prior anteroseptal infarct  pattern.  5/15: Vitals with mildly elevated blood pressure at 167/76, labs with hemoglobin of 8.6, bicarb 20, creatinine improved to 2.97, BNP of 232 but patient also has advanced CKD, troponin remain negative, D-dimer normal. Patient likely have anemia of chronic kidney disease and need Epogen, 1 dose was ordered, likely get benefit from weekly EPO and his nephrologist should be able to arrange.  He was also started on bicarb tablets due to mild metabolic acidosis secondary to CKD stage IV.  Patient with significant improvement with IV fluid.  Likely he was dehydrated, he was told to hold torsemide .  He has not seen his nephrologist recently stating that his nephrologist died and he has not been established with a new one yet.  Patient will hold torsemide  and lisinopril, he is going to call his nephrologist office to get established with a new one as soon as possible.  He received a dose of Retacrit 4000 units before discharge and his nephrologist can take over from here.  He was also given bicarb tablets to help with metabolic acidosis secondary to CKD stage IV.  We increased the dose of home amlodipine  from 5 mg to 10 mg daily for better control of blood pressure.  He was quickly weaned back to room air and does not require any more oxygen.  Patient will continue on current medications and need to have a close follow-up with his providers for further assistance. He will resume his home health services on discharge.  Consultants: None Procedures performed: None Disposition: Home health Diet recommendation:  Discharge Diet Orders (From admission, onward)     Start     Ordered   03/07/24  0000  Diet - low sodium heart healthy        03/07/24 1056           Cardiac and Carb modified diet DISCHARGE MEDICATION: Allergies as of 03/07/2024       Reactions   Ace Inhibitors Other (See Comments)   Hyperkalemia   Minocycline Rash        Medication List     TAKE these medications     amLODipine  10 MG tablet Commonly known as: NORVASC  Take 1 tablet (10 mg total) by mouth daily. Start taking on: Mar 08, 2024 What changed:  medication strength how much to take when to take this   aspirin  81 MG chewable tablet Chew 1 tablet (81 mg total) by mouth daily.   atorvastatin  80 MG tablet Commonly known as: LIPITOR Take 80 mg by mouth daily.   clopidogrel 75 MG tablet Commonly known as: PLAVIX Take 75 mg by mouth daily.   cyanocobalamin  1000 MCG tablet Commonly known as: VITAMIN B12 Take 1 tablet (1,000 mcg total) by mouth daily.   DULoxetine 30 MG capsule Commonly known as: CYMBALTA Take 30 mg by mouth daily.   ferrous sulfate  325 (65 FE) MG tablet Take 325 mg by mouth daily with breakfast.   ipratropium-albuterol  0.5-2.5 (3) MG/3ML Soln Commonly known as: DUONEB Inhale 3 mLs into the lungs every 8 (eight) hours as needed.   metoprolol  succinate 100 MG 24 hr tablet Commonly known as: TOPROL -XL Take 100 mg by mouth daily.   OneTouch Ultra test strip Generic drug: glucose blood USE TO CHECK BLOOD SUGAR BEFORE BREAKFAST, 2 HOURS AFTER LARGEST MEAL, AND AT BEDTIME.   pantoprazole  40 MG tablet Commonly known as: Protonix  Take 1 tablet (40 mg total) by mouth daily.   sodium bicarbonate 650 MG tablet Take 1 tablet (650 mg total) by mouth 2 (two) times daily.   spironolactone 25 MG tablet Commonly known as: ALDACTONE Take 25 mg by mouth daily.   tamsulosin 0.4 MG Caps capsule Commonly known as: FLOMAX Take 0.4 mg by mouth daily.   torsemide  20 MG tablet Commonly known as: DEMADEX  Take 1 tablet (20 mg total) by mouth daily. MUST BE SEEN IN CLINIC FOR ANY FURTHER REFILLS. PLEASE CALL OFFICE TO SCHEDULE AN APPOINTMENT. Please hold until you see your nephrologist What changed: additional instructions   traZODone 50 MG tablet Commonly known as: DESYREL Take 50 mg by mouth at bedtime as needed for sleep.   Tresiba FlexTouch 100 UNIT/ML FlexTouch  Pen Generic drug: insulin  degludec Inject 10 Units into the skin daily.        Follow-up Information     Alayne Hubert A, PA. Schedule an appointment as soon as possible for a visit in 1 week(s).   Specialty: Internal Medicine Contact information: [address] [place] [address] [phone]                Discharge Exam: Anthony Fox Weights   03/07/24 0131  Weight: 84.5 kg   General.  Well-developed gentleman, in no acute distress. Pulmonary.  Lungs clear bilaterally, normal respiratory effort. CV.  Regular rate and rhythm, no JVD, rub or murmur. Abdomen.  Soft, nontender, nondistended, BS positive. CNS.  Alert and oriented .  No focal neurologic deficit. Extremities.  No edema, left prosthetic leg Psychiatry.  Judgment and insight appears normal.   Condition at discharge: stable  The results of significant diagnostics from this hospitalization (including imaging, microbiology, ancillary and laboratory) are listed below  for reference.   Imaging Studies: CT HEAD WO CONTRAST ( ) Result Date: 03/06/2024 CLINICAL DATA:  Dizziness EXAM: CT HEAD WITHOUT CONTRAST TECHNIQUE: Contiguous axial images were obtained from the base of the skull through the vertex without intravenous contrast. RADIATION DOSE REDUCTION: This exam was performed according to the departmental dose-optimization program which includes automated exposure control, adjustment of the mA and/or kV according to patient size and/or use of iterative reconstruction technique. COMPARISON:  None Available. FINDINGS: Brain: Encephalomalacia along the right frontal convexity consistent with chronic cortical infarct. Hypodensities throughout the periventricular and within the central pons consistent with chronic small vessel ischemic changes. No signs of acute infarct or hemorrhage. Lateral ventricles and midline structures are unremarkable. No acute extra-axial fluid collections. No mass effect. Vascular:  No hyperdense vessel or unexpected calcification. Skull: Normal. Negative for fracture or focal lesion. Sinuses/Orbits: No acute finding. Other: None. IMPRESSION: 1. No acute intracranial process. 2. Encephalomalacia right frontal lobe related to chronic cortical infarct. Chronic small vessel ischemic changes throughout the white matter and central pons. Electronically Signed   By: Bobbye Burrow M.D.   On: 03/06/2024 21:00   DG Chest Portable 1 View Result Date: 03/06/2024 CLINICAL DATA:  Dizziness. EXAM: PORTABLE CHEST 1 VIEW COMPARISON:  January 24, 2021 FINDINGS: The dual lead AICD is in place. The heart size and mediastinal contours are within normal limits. A radiopaque atrial occlusion device is suspected. Very mild atelectasis is seen within the left lung base. No acute infiltrate, pleural effusion or pneumothorax is identified. The visualized skeletal structures are unremarkable. IMPRESSION: No active disease. Electronically Signed   By: Virgle Grime M.D.   On: 03/06/2024 18:41    Microbiology: Results for orders placed or performed during the hospital encounter of 05/17/22  Urine Culture     Status: None   Collection Time: 05/16/22  9:42 PM   Specimen: Urine, Clean Catch  Result Value Ref Range Status   Specimen Description   Final    URINE, CLEAN CATCH Performed at [facility], [address] [place], [place] [address]    Special Requests   Final    NONE Performed at [facility], [address] [place], [place] [address]    Culture   Final    NO GROWTH Performed at [facility], [address], [place], [place] [address]    Report Status 05/18/2022 FINAL  Final    Labs: CBC: Recent Labs  Lab 03/06/24 1521 03/07/24 0607  WBC 5.6 5.0  HGB 9.0* 8.6*  HCT 32.3* 30.8*  MCV 68.0* 66.7*  PLT 202 180   Basic Metabolic Panel: Recent Labs  Lab 03/06/24 1521 03/07/24 0607  NA 137 140  K 4.8 5.1  CL 109 110  CO2 21* 20*  GLUCOSE 133* 88   BUN 97* 84*  CREATININE 3.86* 2.97*  CALCIUM  8.8* 9.1   Liver Function Tests: Recent Labs  Lab 03/06/24 1521  AST 58*  ALT 62*  ALKPHOS 100  BILITOT 0.6  PROT 7.2  ALBUMIN  3.4*   CBG: Recent Labs  Lab 03/06/24 2103 03/07/24 0723  GLUCAP 179* 79    Discharge time spent: greater than 30 minutes.  This record has been created using Conservation officer, historic buildings. Errors have been sought and corrected,but may not always be located. Such creation errors do not reflect on the standard of care.   Signed: Luna Salinas, MD [facility] 03/07/2024

## 2024-03-07 NOTE — TOC Transition Note (Signed)
 Transition of Care [facility]) - Discharge Note   Patient Details  Name: Anthony Fox MRN: [id number] Date of Birth: 11-21-1950  [facility]) CM/SW Contact:  Crayton Docker, RN 03/07/2024, 12:53 PM   Clinical Narrative:     Discharge orders noted. [facility] following for resumption of home health PT/OT. Patient's wife, [name] at bedside and will provide caregiver support and transportation.   Final next level of care: Home w Home Health Services Barriers to Discharge: No Barriers Identified   Patient Goals and CMS Choice   Home with home health    Discharge Placement   Home with  home health              Discharge Plan and Services Additional resources added to the After Visit Summary for       HH Arranged: PT, OT HH Agency: [facility] Date [facility] Agency Contacted: 03/07/24 Time HH Agency Contacted: 1248 Representative spoke with at [facility] Agency: Thurston Flow  Social Drivers of Health (SDOH) Interventions SDOH Screenings   Food Insecurity: No Food Insecurity (03/06/2024)  Housing: High Risk (03/06/2024)  Transportation Needs: No Transportation Needs (03/06/2024)  Utilities: Not At Risk (03/06/2024)  Financial Resource Strain: Low Risk  (07/14/2023)   Received from [facility]  Recent Concern: Financial Resource Strain - Medium Risk (06/12/2023)   Received from [facility]  Social Connections: Moderately Integrated (03/06/2024)  Tobacco Use: Medium Risk (03/07/2024)  Health Literacy: Low Risk  (05/12/2023)   Received from [facility]     Readmission Risk Interventions     No data to display

## 2024-03-07 NOTE — Care Management Obs Status (Signed)
 MEDICARE OBSERVATION STATUS NOTIFICATION   Patient Details  Name: Anthony Fox MRN: [id number] Date of Birth: Jan 15, 1951   Medicare Observation Status Notification Given:  Rudolph Cost, CMA 03/07/2024, 11:38 AM

## 2024-03-07 NOTE — Progress Notes (Signed)
 OT Cancellation Note  Patient Details Name: Anthony Fox MRN: [id number] DOB: January 24, 1951   Cancelled Treatment:    Reason Eval/Treat Not Completed: Other (comment). Consult received, chart reviewed. Pt noted with discharge order prior to evaluation attempt. Per MD, pt at baseline now. Will defer evaluation at this time since pt discharging. Will attempt as appropriate should pt not discharge as planned.  Kuuipo Anzaldo R., MPH, MS, OTR/L ascom [phone] 03/07/24, 12:25 PM

## 2024-03-07 NOTE — Evaluation (Signed)
 Physical Therapy Evaluation Patient Details Name: Anthony Fox MRN: [id number] DOB: November 09, 1950 Today's Date: 03/07/2024  History of Present Illness  Pt is a 73 y/o M admitted on 03/06/24 after presenting with c/o lightheadedness/near syncope. Pt is being treated for near syncope of unclear etiology. PMH: anemia, CKD, CHF, HLD, DM, PVD, AICD, chronic RUE numbness & mild R sided weakness 2/2 stroke, GERD, CKD3B, a-fib, L BKA  Clinical Impression  Pt seen for PT evaluation with pt agreeable, very pleasant throughout session. Pt reports he lives with his wife in a 1 level home with ramped entry, primarily uses w/c & slide board with PRN assist but recently received LLE prosthesis & was working on standing/gait with HHPT. On this date, pt is able to come to sitting EOB & don LLE garments with supervision for balance. Pt transfer sit<>stand & step pivot with RW & min assist; good ability to weight shift & step L<>R to recliner. Further gait deferred 2/2 low BP & pt feeling slightly dizzy with standing & after transferring to recliner. Team made aware of pt's BP during session (as well as cloudy urine); encouraged pt to drink fluids.  Will continue to follow pt acutely to progress mobility as able.  BP checked in LUE  BP HR  Supine  142/63 mmHg MAP 87 60 bpm  Sitting 125/30 mmHg MAP 58 (no c/o symptoms) 87 bpm  After transferring to recliner, sitting 110/39 mmHg MAP 60 (c/o dizziness) 80 bpm  Seated in recliner after resting 166/53 mmHg MAP 87 (c/o dizziness) 63 bpm  Re-checked in recliner 168/63 mmHg MAP 93 (c/o dizziness) 62 bpm         If plan is discharge home, recommend the following: A little help with walking and/or transfers;A little help with bathing/dressing/bathroom;Assist for transportation;Help with stairs or ramp for entrance;Assistance with cooking/housework   Can travel by private vehicle        Equipment Recommendations None recommended by PT  Recommendations for Other  Services       Functional Status Assessment Patient has had a recent decline in their functional status and demonstrates the ability to make significant improvements in function in a reasonable and predictable amount of time.     Precautions / Restrictions Precautions Precautions: Fall Restrictions Weight Bearing Restrictions Per Provider Order: No      Mobility  Bed Mobility Overal bed mobility: Modified Independent             General bed mobility comments: supine>sit on R side of bed, HOB slightly elevated    Transfers Overall transfer level: Needs assistance Equipment used: Rolling walker (2 wheels) Transfers: Sit to/from Stand, Bed to chair/wheelchair/BSC Sit to Stand: Min assist   Step pivot transfers: Min assist       General transfer comment: sit<>stand from low bed with good ability to push to standing with BUE. Step pivot bed>recliner on R with min assist, good ability to weight shift.    Ambulation/Gait Ambulation/Gait assistance:  (deferred 2/2 low BP)                Stairs            Wheelchair Mobility     Tilt Bed    Modified Rankin (Stroke Patients Only)       Balance Overall balance assessment: Needs assistance Sitting-balance support: Feet supported Sitting balance-Leahy Scale: Good Sitting balance - Comments: dons R sock & shoe without LOB with supervision, dons LLE garments   Standing balance support: Bilateral upper  extremity supported, During functional activity, Reliant on assistive device for balance Standing balance-Leahy Scale: Fair                               Pertinent Vitals/Pain Pain Assessment Pain Assessment: No/denies pain    Home Living Family/patient expects to be discharged to:: Private residence Living Arrangements: Spouse/significant other Available Help at Discharge: Family (wife works at a school during the day, pt home alone) Type of Home: House Home Access: Ramped  entrance;Stairs to enter       Home Layout: One level Home Equipment: Agricultural consultant (2 wheels);Wheelchair - manual;Other (comment) (drop arm BSC, sliding board)      Prior Function               Mobility Comments: Pt requires PRN assistance for slide board transfers to/from w/c but is able to transfer to/from drop arm BSC without assistance, propels w/c without assistance, home alone & managing well during the day. Received LLE prosthetic a couple weeks ago & working with HHPT on standing/gait with it. ADLs Comments: Sponge bathes     Extremity/Trunk Assessment   Upper Extremity Assessment Upper Extremity Assessment: Overall WFL for tasks assessed    Lower Extremity Assessment Lower Extremity Assessment: Overall WFL for tasks assessed;LLE deficits/detail LLE Deficits / Details: L BKA       Communication   Communication Communication: No apparent difficulties    Cognition Arousal: Alert Behavior During Therapy: WFL for tasks assessed/performed   PT - Cognitive impairments: No apparent impairments                       PT - Cognition Comments: very pleasant gentleman Following commands: Intact       Cueing Cueing Techniques: Verbal cues     General Comments      Exercises     Assessment/Plan    PT Assessment Patient needs continued PT services  PT Problem List Decreased strength;Decreased activity tolerance;Decreased balance;Decreased mobility;Decreased knowledge of use of DME       PT Treatment Interventions DME instruction;Balance training;Gait training;Neuromuscular re-education;Functional mobility training;Therapeutic activities;Therapeutic exercise;Patient/family education    PT Goals (Current goals can be found in the Care Plan section)  Acute Rehab PT Goals Patient Stated Goal: figure out what's going on PT Goal Formulation: With patient Time For Goal Achievement: 03/21/24 Potential to Achieve Goals: Good    Frequency Min  2X/week     Co-evaluation               AM-PAC PT "6 Clicks" Mobility  Outcome Measure Help needed turning from your back to your side while in a flat bed without using bedrails?: None Help needed moving from lying on your back to sitting on the side of a flat bed without using bedrails?: A Little Help needed moving to and from a bed to a chair (including a wheelchair)?: A Little Help needed standing up from a chair using your arms (e.g., wheelchair or bedside chair)?: A Little Help needed to walk in hospital room?: A Little Help needed climbing 3-5 steps with a railing? : A Lot 6 Click Score: 18    End of Session   Activity Tolerance: Treatment limited secondary to medical complications (Comment);Patient tolerated treatment well (2/2 low BP) Patient left: in chair;with chair alarm set;with call bell/phone within reach Nurse Communication: Mobility status PT Visit Diagnosis: Other abnormalities of gait and mobility (R26.89);Muscle weakness (generalized) (  M62.81)    Time: 1610-9604 PT Time Calculation (min) (ACUTE ONLY): 27 min   Charges:   PT Evaluation $PT Eval Moderate Complexity: 1 Mod   PT General Charges $$ ACUTE PT VISIT: 1 Visit         Emaline Handsome, PT, DPT 03/07/24, 9:46 AM   Venetta Gill 03/07/2024, 9:42 AM

## 2024-03-07 NOTE — Hospital Course (Addendum)
 Taken from H&P.  Anthony Fox is a 73 y.o. male with medical history significant of hypertension, hyperlipidemia, diabetes mellitus, PVD, AICD, diastolic CHF, stroke with chronic right arm numbness and mild right-sided weakness), GERD, CKD-4, anemia, A-fib not on anticoagulants (taking Plavix currently), s/p of left BKA, who presents with near syncope.   Patient was working with PT with his new prosthetic leg when he became dizzy, diaphoretic, lightheaded nests and almost passed out but never lost consciousness.  No fall or injury.  On presentation patient was mildly hypoxic at 83% which improved with 2 L of oxygen.  Rest of the vitals stable.  Labs with worsening renal function, creatinine at 3.86, BUN 97 with baseline of 2.69 on 06/27/2023.  Chest x-ray and CT head was negative for any acute abnormality.  EKG with NSR, QTc 491, borderline left axis deviation, poor R wave progression and prior anteroseptal infarct pattern.  5/15: Vitals with mildly elevated blood pressure at 167/76, labs with hemoglobin of 8.6, bicarb 20, creatinine improved to 2.97, BNP of 232 but patient also has advanced CKD, troponin remain negative, D-dimer normal. Patient likely have anemia of chronic kidney disease and need Epogen, 1 dose was ordered, likely get benefit from weekly EPO and his nephrologist should be able to arrange.  He was also started on bicarb tablets due to mild metabolic acidosis secondary to CKD stage IV.  Patient with significant improvement with IV fluid.  Likely he was dehydrated, he was told to hold torsemide .  He has not seen his nephrologist recently stating that his nephrologist died and he has not been established with a new one yet.  Patient will hold torsemide  and lisinopril, he is going to call his nephrologist office to get established with a new one as soon as possible.  He received a dose of Retacrit 4000 units before discharge and his nephrologist can take over from here.  He was also given  bicarb tablets to help with metabolic acidosis secondary to CKD stage IV.  We increased the dose of home amlodipine  from 5 mg to 10 mg daily for better control of blood pressure.  He was quickly weaned back to room air and does not require any more oxygen.  Patient will continue on current medications and need to have a close follow-up with his providers for further assistance. He will resume his home health services on discharge.

## 2024-03-07 NOTE — TOC Initial Note (Signed)
 Transition of Care [facility]) - Initial/Assessment Note    Patient Details  Name: Anthony Fox MRN: [id number] Date of Birth: 02/11/1951  [facility] CM/SW Contact:    Crayton Docker, RN 03/07/2024, 12:49 PM  Clinical Narrative:                  CM to patient's room regarding TOC screening assessment. CM introduced case management role and discharge care planning process. Patient and patient's wife verbalized understanding and agreement with screening interview. Per patient's wife, patient has 3 canes, ramp, tub/shower bench, rolling walker, wheelchair. Per patient's wife, patient is active with [facility] for PT/OT. Noted, home health PT/OT orders. CM call to [facility], phone: [phone] regarding resumption of care orders for home health PT/OT. Per [facility], will continue to follow for resumption of care. CM alert to Dr. Ariel Begun per patient's wife, request to discuss patient's care.   Expected Discharge Plan: Home w Home Health Services Barriers to Discharge: No Barriers Identified   Patient Goals and CMS Choice    Home with resumption of home health   Expected Discharge Plan and Services    Home with home health     Expected Discharge Date: 03/07/24                  HH Arranged: PT, OT HH Agency: [facility] Date HH Agency Contacted: 03/07/24 Time HH Agency Contacted: [phone] Representative spoke with at [facility] Agency: [facility] Flow  Prior Living Arrangements/Services     Patient language and need for interpreter reviewed:: No        Need for Family Participation in Patient Care: Yes (Comment) Care giver support system in place?: Yes (comment) Current home services: DME, Home OT, Home PT (has ramp, 3 canes, tub/shower bench, rolling walker and wheelchair. Per patient's wife, patient is active with [facility] for PT/OT) Criminal Activity/Legal Involvement Pertinent to Current Situation/Hospitalization: No - Comment as needed  Activities of  Daily Living   ADL Screening (condition at time of admission) Independently performs ADLs?: No Does the patient have a NEW difficulty with bathing/dressing/toileting/self-feeding that is expected to last >3 days?: No Does the patient have a NEW difficulty with getting in/out of bed, walking, or climbing stairs that is expected to last >3 days?: No Does the patient have a NEW difficulty with communication that is expected to last >3 days?: No Is the patient deaf or have difficulty hearing?: No Does the patient have difficulty seeing, even when wearing glasses/contacts?: No Does the patient have difficulty concentrating, remembering, or making decisions?: No  Permission Sought/Granted Permission sought to share information with : Case Manager, Family Supports Permission granted to share information with : Yes, Verbal Permission Granted  Share Information with NAME: Jonan Kortan     Permission granted to share info w Relationship: wife  Permission granted to share info w Contact Information: yes  Emotional Assessment Appearance:: Appears stated age Attitude/Demeanor/Rapport: Engaged Affect (typically observed): Calm     Psych Involvement: No (comment)  Admission diagnosis:  Hypoxia [R09.02] Near syncope [R55] Patient Active Problem List   Diagnosis Date Noted   Hypoxia 03/07/2024   Near syncope 03/06/2024   Iron  deficiency anemia 03/06/2024   Type II diabetes mellitus with renal manifestations (HCC) 03/06/2024   Acute renal failure superimposed on stage 3b chronic kidney disease (HCC) 03/06/2024   Stroke (HCC) 03/06/2024   Pernicious anemia 02/11/2021   Iron  deficiency anemia due to chronic blood loss  B12 deficiency    Hypotension    Chronic diastolic CHF (congestive heart failure) (HCC)    Acute on chronic blood loss anemia 01/24/2021   Acute respiratory failure with hypoxia (HCC) 10/05/2020   CKD stage 3 due to type 2 diabetes mellitus (HCC) 10/05/2020   Hyperkalemia  10/05/2020   Elevated troponin 10/05/2020   Acute CHF (congestive heart failure) (HCC) 10/05/2020   Hyperlipidemia associated with type 2 diabetes mellitus (HCC) 10/05/2020   Acute renal failure superimposed on stage 3 chronic kidney disease (HCC) 10/05/2020   Occult blood in stools 09/15/2014   Paroxysmal atrial fibrillation (HCC) 05/08/2014   Corn or callus 01/27/2014   DM (diabetes mellitus) type II uncontrolled, periph vascular disorder 08/14/2013   Anemia of chronic kidney failure, stage 3 (moderate) (HCC) 06/11/2013   Jaundice 06/11/2013   Weakness 06/11/2013   PVD (peripheral vascular disease) (HCC) 04/17/2013   Background diabetic retinopathy (HCC) 09/18/2012   Senile cataract 09/18/2012   Atherosclerosis of native arteries of the extremities with ulceration (HCC) 08/03/2011   HTN (hypertension) 12/20/2010   Type II diabetes mellitus, uncontrolled 12/20/2010   PCP:  Abbott Abbot, PA Pharmacy:   [facility] [address]), [place] - [address] [place]) [place] [address] Phone: [phone] Fax: [phone]     Social Drivers of Health (SDOH) Social History: SDOH Screenings   Food Insecurity: No Food Insecurity (03/06/2024)  Housing: High Risk (03/06/2024)  Transportation Needs: No Transportation Needs (03/06/2024)  Utilities: Not At Risk (03/06/2024)  Financial Resource Strain: Low Risk  (07/14/2023)   Received from [facility]  Recent Concern: Financial Resource Strain - Medium Risk (06/12/2023)   Received from [facility]  Social Connections: Moderately Integrated (03/06/2024)  Tobacco Use: Medium Risk (03/07/2024)  Health Literacy: Low Risk  (05/12/2023)   Received from [facility]   SDOH Interventions:     Readmission Risk Interventions     No data to display

## 2024-03-29 ENCOUNTER — Inpatient Hospital Stay

## 2024-03-29 ENCOUNTER — Inpatient Hospital Stay: Admitting: Oncology

## 2024-04-23 DEATH — deceased
